# Patient Record
Sex: Female | Born: 1945
Health system: Southern US, Community
[De-identification: ages and names within clinical notes are randomized; demographics above are authoritative.]

## PROBLEM LIST (undated history)

## (undated) DIAGNOSIS — E119 Type 2 diabetes mellitus without complications: Secondary | ICD-10-CM

## (undated) DIAGNOSIS — I10 Essential (primary) hypertension: Secondary | ICD-10-CM

## (undated) DIAGNOSIS — F32A Depression, unspecified: Secondary | ICD-10-CM

## (undated) DIAGNOSIS — I679 Cerebrovascular disease, unspecified: Secondary | ICD-10-CM

## (undated) DIAGNOSIS — F329 Major depressive disorder, single episode, unspecified: Secondary | ICD-10-CM

## (undated) HISTORY — PX: ABDOMINAL HYSTERECTOMY: SHX81

## (undated) HISTORY — DX: Cerebrovascular disease, unspecified: I67.9

## (undated) HISTORY — DX: Depression, unspecified: F32.A

## (undated) HISTORY — PX: HAND SURGERY: SHX662

## (undated) HISTORY — DX: Essential (primary) hypertension: I10

## (undated) HISTORY — DX: Type 2 diabetes mellitus without complications: E11.9

## (undated) HISTORY — DX: Major depressive disorder, single episode, unspecified: F32.9

---

## 1999-02-04 ENCOUNTER — Other Ambulatory Visit: Admission: RE | Admit: 1999-02-04 | Discharge: 1999-02-04 | Payer: Self-pay | Admitting: Obstetrics and Gynecology

## 1999-04-09 ENCOUNTER — Other Ambulatory Visit: Admission: RE | Admit: 1999-04-09 | Discharge: 1999-04-09 | Payer: Self-pay | Admitting: Obstetrics and Gynecology

## 2000-03-02 ENCOUNTER — Other Ambulatory Visit: Admission: RE | Admit: 2000-03-02 | Discharge: 2000-03-02 | Payer: Self-pay | Admitting: Obstetrics and Gynecology

## 2001-06-22 ENCOUNTER — Ambulatory Visit (HOSPITAL_COMMUNITY): Admission: RE | Admit: 2001-06-22 | Discharge: 2001-06-22 | Payer: Self-pay | Admitting: Gastroenterology

## 2002-03-15 ENCOUNTER — Encounter: Payer: Self-pay | Admitting: Obstetrics and Gynecology

## 2002-03-15 ENCOUNTER — Ambulatory Visit (HOSPITAL_COMMUNITY): Admission: RE | Admit: 2002-03-15 | Discharge: 2002-03-15 | Payer: Self-pay | Admitting: Obstetrics and Gynecology

## 2002-09-12 ENCOUNTER — Ambulatory Visit (HOSPITAL_COMMUNITY): Admission: RE | Admit: 2002-09-12 | Discharge: 2002-09-12 | Payer: Self-pay | Admitting: Orthopedic Surgery

## 2002-09-12 ENCOUNTER — Encounter: Payer: Self-pay | Admitting: Orthopedic Surgery

## 2004-06-09 ENCOUNTER — Other Ambulatory Visit: Admission: RE | Admit: 2004-06-09 | Discharge: 2004-06-09 | Payer: Self-pay | Admitting: Obstetrics and Gynecology

## 2005-06-16 ENCOUNTER — Other Ambulatory Visit: Admission: RE | Admit: 2005-06-16 | Discharge: 2005-06-16 | Payer: Self-pay | Admitting: Obstetrics and Gynecology

## 2006-06-22 ENCOUNTER — Other Ambulatory Visit: Admission: RE | Admit: 2006-06-22 | Discharge: 2006-06-22 | Payer: Self-pay | Admitting: Obstetrics and Gynecology

## 2006-12-07 ENCOUNTER — Encounter: Admission: RE | Admit: 2006-12-07 | Discharge: 2006-12-07 | Payer: Self-pay | Admitting: General Surgery

## 2006-12-09 ENCOUNTER — Ambulatory Visit (HOSPITAL_BASED_OUTPATIENT_CLINIC_OR_DEPARTMENT_OTHER): Admission: RE | Admit: 2006-12-09 | Discharge: 2006-12-09 | Payer: Self-pay | Admitting: Orthopedic Surgery

## 2007-07-05 ENCOUNTER — Other Ambulatory Visit: Admission: RE | Admit: 2007-07-05 | Discharge: 2007-07-05 | Payer: Self-pay | Admitting: Obstetrics and Gynecology

## 2008-07-16 ENCOUNTER — Other Ambulatory Visit: Admission: RE | Admit: 2008-07-16 | Discharge: 2008-07-16 | Payer: Self-pay | Admitting: Obstetrics and Gynecology

## 2009-07-22 ENCOUNTER — Other Ambulatory Visit: Admission: RE | Admit: 2009-07-22 | Discharge: 2009-07-22 | Payer: Self-pay | Admitting: Obstetrics and Gynecology

## 2010-12-20 ENCOUNTER — Encounter: Payer: Self-pay | Admitting: General Surgery

## 2011-04-17 NOTE — Op Note (Signed)
NAME:  Cindy Rhodes, Cindy Rhodes                ACCOUNT NO.:  [id number]   MEDICAL RECORD NO.:  [id number]          PATIENT TYPE:  AMB   LOCATION:  DSC                          FACILITY:  [facility]   PHYSICIAN:  Katy Fitch. Sypher, M.D. DATE OF BIRTH:  [birth year]   DATE OF PROCEDURE:  12/09/2006  DATE OF DISCHARGE:                               OPERATIVE REPORT   PREOPERATIVE DIAGNOSIS:  1. Chronic stenosing tenosynovitis left index finger.  2. Chronic stenosing tenosynovitis left long finger.  3. Multiple ganglion formation flexor sheath left long finger.  4. Hemangioma left index finger.   OPERATION:  1. Release of left long finger A1 pulley.  2. Excision of two ganglion cysts from flexor sheath left long finger.  3. Release of A1 pulley left index finger.  4. Excision of hemangioma from left index finger soft tissues palmar      to A1, A2 pulley junction.   SURGEON:  Dr. Molly Maduro Sypher   ASSISTANT:  Annye Rusk PA-C.   ANESTHESIA:  IV regional at proximal brachial levels supplemented by IV  sedation, supervising anesthesiologist Dr. Gypsy Balsam.   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  She has a  history of developing stenosing tenosynovitis of her left index and long  fingers with masses along the palmar surface of the flexor sheath and  long finger consistent with ganglion cyst formation and a hyper mobile  mass in the index finger, long finger web space.  She requested that we  address a stenosing tenosynovitis and remove the masses.   After informed consent, she is brought to the operating room at this  time.   PROCEDURE:  Cindy Rhodes was brought to the operating room and placed in  supine position on the table.  Following anesthesia consult by Dr. Gypsy Balsam  an IV regional block was selected.   In room one an IV regional block was placed at proximal brachial level  under Dr. Burnett Corrente direct  supervision.  Sedation was provided.  The left  arm was then prepped with Betadine soap solution, sterilely draped.  When anesthesia satisfactory we proceeded with transverse incisions, one  in the line of the middle palmar crease and one in the line of the  middle palmar crease.  The flexor sheath of the index and long fingers  were exposed.  The mass between the index, long finger turned out to be  a large cavernous hemangioma.  This was circumferentially dissected,  removed with scissors and its feeding vessels were electrocauterized  with bipolar current.   The A1 pulley of the index finger was then isolated and after protection  of the neurovascular bundles.  The pulley was released along its radial  border with scalpel and scissors.   Attention directed to the long finger.  The A1 pulley was isolated,  found be markedly thickened to wall thickness of at least 3 mm.  This  was released along its radial border.  Distal dissection along the  flexor sheath revealed two ganglion cysts. One  in the ulnar aspect of  the A1-A2 pulley junction and a second one on the proximal aspect of the  A2 pulley radially.  These were removed with a rongeur.   Thereafter full range of motion of the index and long finger was  recovered.  We could not identify any other masses.  There was a small  area of concern near the PIP flexion crease of the index finger which  was thought to be a varicosity.  We left this unexplored.   Cindy Rhodes tolerated surgery and anesthesia well.  Wounds repaired with  mattress suture of 5-0 nylon followed by dressing the wounds with  Xeroflo, sterile gauze and an Ace wrap.  There were no apparent  complications.   Aftercare Cindy Rhodes is provided prescription for Vicodin 5 mg one p.o. 4-  6 hours p.r.n. pain 20 tablets without refill.   She will return to see [place] in the office for follow-up in 1 week at which  time we anticipate suture removal and advancement to exercise  program.      Katy Fitch. Sypher, M.D.  Electronically Signed     RVS/MEDQ  D:  12/09/2006  T:  12/09/2006  Job:  [id number]   cc:   Theressa Millard, M.D.

## 2013-10-16 ENCOUNTER — Ambulatory Visit (INDEPENDENT_AMBULATORY_CARE_PROVIDER_SITE_OTHER): Payer: Medicare Other | Admitting: Podiatry

## 2013-10-16 ENCOUNTER — Encounter: Payer: Self-pay | Admitting: Podiatry

## 2013-10-16 VITALS — BP 145/81 | HR 61 | Resp 18 | Ht 62.0 in | Wt 165.0 lb

## 2013-10-16 DIAGNOSIS — B351 Tinea unguium: Secondary | ICD-10-CM

## 2013-10-16 DIAGNOSIS — M79609 Pain in unspecified limb: Secondary | ICD-10-CM

## 2013-10-18 NOTE — Progress Notes (Signed)
Subjective:     Patient ID: Cindy Rhodes, female   DOB: 1946/11/10, 67 y.o.   MRN: [id number]  HPI patient has nail disease 1-5 both feet with inability for her to cut them and at risk diabetes   Review of Systems     Objective:   Physical Exam Diminished neurovascular status with nail disease and thickness with discomfort 1-5 both feet    Assessment:     Mycotic nail infection at wrist diabetic and pain 1-5 both feet    Plan:     Debridement painful nail bed 1-5 both feet

## 2014-01-08 ENCOUNTER — Ambulatory Visit (INDEPENDENT_AMBULATORY_CARE_PROVIDER_SITE_OTHER): Payer: Medicare Other | Admitting: Podiatry

## 2014-01-08 VITALS — BP 161/81 | HR 74 | Resp 16 | Ht 62.0 in | Wt 165.0 lb

## 2014-01-08 DIAGNOSIS — B351 Tinea unguium: Secondary | ICD-10-CM

## 2014-01-08 DIAGNOSIS — M79609 Pain in unspecified limb: Secondary | ICD-10-CM

## 2014-01-08 DIAGNOSIS — L84 Corns and callosities: Secondary | ICD-10-CM

## 2014-01-09 NOTE — Progress Notes (Signed)
Subjective:     Patient ID: Cindy Rhodes, female   DOB: 14-Jun-1946, 68 y.o.   MRN: [id number]  HPI patient presents with painful nailbeds 1-5 both feet that she can not cut herself. Also complains of lesions on both feet with the left third been the most painful   Review of Systems     Objective:   Physical Exam Neurovascular status intact with nail disease 1-5 both feet that are painful and keratotic lesion third left that is painful    Assessment:     Chronic mycotic nail infection with pain 1-5 both feet and painful lesions left    Plan:     Debridement of nailbeds painful 1-5 both feet and debridement of lesion on the left foot with no bleeding noted

## 2014-01-15 ENCOUNTER — Ambulatory Visit: Payer: Medicare Other | Admitting: Podiatry

## 2014-04-02 ENCOUNTER — Emergency Department (HOSPITAL_COMMUNITY)
Admission: EM | Admit: 2014-04-02 | Discharge: 2014-04-02 | Disposition: A | Discharge status: Home / Self Care | Payer: Medicare Other | Attending: Emergency Medicine | Admitting: Emergency Medicine

## 2014-04-02 ENCOUNTER — Emergency Department (HOSPITAL_COMMUNITY): Payer: Medicare Other

## 2014-04-02 ENCOUNTER — Encounter (HOSPITAL_COMMUNITY): Payer: Self-pay | Admitting: Emergency Medicine

## 2014-04-02 DIAGNOSIS — R739 Hyperglycemia, unspecified: Secondary | ICD-10-CM

## 2014-04-02 DIAGNOSIS — R5383 Other fatigue: Secondary | ICD-10-CM | POA: Insufficient documentation

## 2014-04-02 DIAGNOSIS — Z9119 Patient's noncompliance with other medical treatment and regimen: Secondary | ICD-10-CM | POA: Insufficient documentation

## 2014-04-02 DIAGNOSIS — I10 Essential (primary) hypertension: Secondary | ICD-10-CM | POA: Insufficient documentation

## 2014-04-02 DIAGNOSIS — Z87891 Personal history of nicotine dependence: Secondary | ICD-10-CM | POA: Insufficient documentation

## 2014-04-02 DIAGNOSIS — R209 Unspecified disturbances of skin sensation: Secondary | ICD-10-CM | POA: Insufficient documentation

## 2014-04-02 DIAGNOSIS — E119 Type 2 diabetes mellitus without complications: Secondary | ICD-10-CM | POA: Insufficient documentation

## 2014-04-02 DIAGNOSIS — R5381 Other malaise: Secondary | ICD-10-CM | POA: Insufficient documentation

## 2014-04-02 DIAGNOSIS — F430 Acute stress reaction: Secondary | ICD-10-CM | POA: Insufficient documentation

## 2014-04-02 DIAGNOSIS — Z91199 Patient's noncompliance with other medical treatment and regimen due to unspecified reason: Secondary | ICD-10-CM | POA: Insufficient documentation

## 2014-04-02 DIAGNOSIS — F329 Major depressive disorder, single episode, unspecified: Secondary | ICD-10-CM | POA: Insufficient documentation

## 2014-04-02 DIAGNOSIS — F3289 Other specified depressive episodes: Secondary | ICD-10-CM | POA: Insufficient documentation

## 2014-04-02 DIAGNOSIS — Z79899 Other long term (current) drug therapy: Secondary | ICD-10-CM | POA: Insufficient documentation

## 2014-04-02 DIAGNOSIS — F411 Generalized anxiety disorder: Secondary | ICD-10-CM | POA: Insufficient documentation

## 2014-04-02 LAB — COMPREHENSIVE METABOLIC PANEL
ALBUMIN: 4.1 g/dL (ref 3.5–5.2)
ALT: 69 U/L — ABNORMAL HIGH (ref 0–35)
AST: 56 U/L — AB (ref 0–37)
Alkaline Phosphatase: 65 U/L (ref 39–117)
BUN: 10 mg/dL (ref 6–23)
CALCIUM: 9.6 mg/dL (ref 8.4–10.5)
CO2: 22 mEq/L (ref 19–32)
Chloride: 87 mEq/L — ABNORMAL LOW (ref 96–112)
Creatinine, Ser: 0.4 mg/dL — ABNORMAL LOW (ref 0.50–1.10)
GFR calc Af Amer: >90 mL/min (ref >90)
GFR calc non Af Amer: >90 mL/min (ref >90)
Glucose, Bld: 293 mg/dL — ABNORMAL HIGH (ref 70–99)
Potassium: 3.7 mEq/L (ref 3.7–5.3)
Sodium: 129 mEq/L — ABNORMAL LOW (ref 137–147)
Total Bilirubin: 0.5 mg/dL (ref 0.3–1.2)
Total Protein: 7.5 g/dL (ref 6.0–8.3)

## 2014-04-02 LAB — CBC WITH DIFFERENTIAL/PLATELET
Basophils Absolute: 0 10*3/uL (ref 0.0–0.1)
Basophils Relative: 0 % (ref 0–1)
EOS PCT: 1 % (ref 0–5)
Eosinophils Absolute: 0.1 10*3/uL (ref 0.0–0.7)
HEMATOCRIT: 39.2 % (ref 36.0–46.0)
Hemoglobin: 14 g/dL (ref 12.0–15.0)
LYMPHS ABS: 0.9 10*3/uL (ref 0.7–4.0)
LYMPHS PCT: 9 % — AB (ref 12–46)
MCH: 29.7 pg (ref 26.0–34.0)
MCHC: 35.7 g/dL (ref 30.0–36.0)
MCV: 83.1 fL (ref 78.0–100.0)
MONO ABS: 0.5 10*3/uL (ref 0.1–1.0)
MONOS PCT: 5 % (ref 3–12)
Neutro Abs: 8.2 10*3/uL — ABNORMAL HIGH (ref 1.7–7.7)
Neutrophils Relative %: 85 % — ABNORMAL HIGH (ref 43–77)
PLATELETS: 245 10*3/uL (ref 150–400)
RBC: 4.72 MIL/uL (ref 3.87–5.11)
RDW: 13.8 % (ref 11.5–15.5)
WBC: 9.7 10*3/uL (ref 4.0–10.5)

## 2014-04-02 LAB — URINALYSIS, ROUTINE W REFLEX MICROSCOPIC
Bilirubin Urine: NEGATIVE
Glucose, UA: 500 mg/dL — AB
HGB URINE DIPSTICK: NEGATIVE
KETONES UR: NEGATIVE mg/dL
LEUKOCYTES UA: NEGATIVE
Nitrite: NEGATIVE
PH: 7.5 (ref 5.0–8.0)
Protein, ur: NEGATIVE mg/dL
Specific Gravity, Urine: 1.013 (ref 1.005–1.030)
Urobilinogen, UA: 0.2 mg/dL (ref 0.0–1.0)

## 2014-04-02 LAB — I-STAT TROPONIN, ED: TROPONIN I, POC: 0.02 ng/mL (ref 0.00–0.08)

## 2014-04-02 NOTE — ED Notes (Signed)
Patient has returned from xray 

## 2014-04-02 NOTE — ED Notes (Addendum)
Pt reports that LSN is several days ago. Pt reports for past few days feeling unwell, feeling like "jump out of skin", hypertension that she takes meds for, tingling in left arm. SOB, belching. Reports very shaky and generalized weakness in legs as well. Pt reports that she has had some mild diarr.

## 2014-04-02 NOTE — ED Notes (Signed)
Dr Allen at bedside  

## 2014-04-02 NOTE — Discharge Instructions (Signed)

## 2014-04-02 NOTE — ED Provider Notes (Signed)
CSN: [id number]     Arrival date & time 04/02/14  1151 History   First MD Initiated Contact with Patient 04/02/14 1457     Chief Complaint  Patient presents with  . Dizziness  . Weakness  . Tingling     (Consider location/radiation/quality/duration/timing/severity/associated sxs/prior Treatment) Patient is a 68 y.o. female presenting with dizziness and weakness. The history is provided by the patient and the spouse.  Dizziness Weakness   patient here complaining of weakness and feeling very anxious. Increased stress at home associated with the upcoming business trip by her husband. Patient denies anginal type chest pain. No severe shortness of breath. No abdominal pain. History of anxiety before in the past. Symptoms are persistent. She does take Zoloft daily for depression. No fever or chills. No vomiting or diarrhea. Nothing makes her symptoms better. Has a history of diabetes and has not been compliant with her diabetic diet. She does not take her blood sugars daily on a regular basis.  Past Medical History  Diagnosis Date  . Diabetes mellitus without complication   . Depression   . Hypertension    Past Surgical History  Procedure Laterality Date  . Abdominal hysterectomy    . Hand surgery Left    History reviewed. No pertinent family history. History  Substance Use Topics  . Smoking status: Former Research scientist (life sciences)  . Smokeless tobacco: Never Used     Comment: quit years ago   . Alcohol Use: No   OB History   Grav Para Term Preterm Abortions TAB SAB Ect Mult Living                 Review of Systems  Neurological: Positive for dizziness and weakness.  All other systems reviewed and are negative.     Allergies  Meloxicam and Sulfa antibiotics  Home Medications   Prior to Admission medications   Medication Sig Start Date End Date Taking? Authorizing Provider  CRESTOR 5 MG tablet  10/06/13   Historical Provider, MD  metFORMIN (GLUCOPHAGE) 500 MG tablet Take 500 mg by mouth  2 (two) times daily with a meal.    Historical Provider, MD  metoprolol tartrate (LOPRESSOR) 25 MG tablet  09/30/13   Historical Provider, MD  sertraline (ZOLOFT) 50 MG tablet  07/22/13   Historical Provider, MD  valsartan-hydrochlorothiazide (DIOVAN-HCT) 320-25 MG per tablet  08/12/13   Historical Provider, MD   BP 178/94  Pulse 98  Temp(Src) 98.7 F (37.1 C) (Oral)  Resp 18  Ht 5\' 2"  (1.575 m)  Wt 167 lb (75.751 kg)  BMI 30.54 kg/m2  SpO2 100% Physical Exam  Nursing note and vitals reviewed. Constitutional: She is oriented to person, place, and time. She appears well-developed and well-nourished.  Non-toxic appearance. No distress.  HENT:  Head: Normocephalic and atraumatic.  Eyes: Conjunctivae, EOM and lids are normal. Pupils are equal, round, and reactive to light.  Neck: Normal range of motion. Neck supple. No tracheal deviation present. No mass present.  Cardiovascular: Normal rate, regular rhythm and normal heart sounds.  Exam reveals no gallop.   No murmur heard. Pulmonary/Chest: Effort normal and breath sounds normal. No stridor. No respiratory distress. She has no decreased breath sounds. She has no wheezes. She has no rhonchi. She has no rales.  Abdominal: Soft. Normal appearance and bowel sounds are normal. She exhibits no distension. There is no tenderness. There is no rebound and no CVA tenderness.  Musculoskeletal: Normal range of motion. She exhibits no edema and no tenderness.  Neurological: She is alert and oriented to person, place, and time. She has normal strength. No cranial nerve deficit or sensory deficit. GCS eye subscore is 4. GCS verbal subscore is 5. GCS motor subscore is 6.  Skin: Skin is warm and dry. No abrasion and no rash noted.  Psychiatric: Her speech is normal and behavior is normal. Her mood appears anxious.    ED Course  Procedures (including critical care time) Labs Review Labs Reviewed  COMPREHENSIVE METABOLIC PANEL - Abnormal; Notable for the  following:    Sodium 129 (*)    Chloride 87 (*)    Glucose, Bld 293 (*)    Creatinine, Ser 0.40 (*)    AST 56 (*)    ALT 69 (*)    All other components within normal limits  CBC WITH DIFFERENTIAL - Abnormal; Notable for the following:    Neutrophils Relative % 85 (*)    Neutro Abs 8.2 (*)    Lymphocytes Relative 9 (*)    All other components within normal limits  URINALYSIS, ROUTINE W REFLEX MICROSCOPIC - Abnormal; Notable for the following:    Glucose, UA 500 (*)    All other components within normal limits  I-STAT TROPOININ, ED    Imaging Review Dg Chest 2 View  04/02/2014   CLINICAL DATA:  Dizziness and weakness.  Tingling  EXAM: CHEST  2 VIEW  COMPARISON:  None.  FINDINGS: The heart size and mediastinal contours are within normal limits. Both lungs are clear. The visualized skeletal structures are unremarkable.  IMPRESSION: No active cardiopulmonary disease.   Electronically Signed   By: Kerby Moors M.D.   On: 04/02/2014 13:51     EKG Interpretation   Date/Time:  Monday Apr 02 2014 12:16:18 EDT Ventricular Rate:  96 PR Interval:  130 QRS Duration: 80 QT Interval:  372 QTC Calculation: 469 R Axis:   22 Text Interpretation:  Normal sinus rhythm Nonspecific ST and T wave  abnormality Abnormal ECG No significant change since last tracing  Confirmed by Zenia Resides  MD, Warren Lindahl ([id number]) on 04/02/2014 2:57:33 PM      MDM   Final diagnoses:  None   Spoke with patient's primary care Dr., Dr. Maxwell Caul, he recommends no therapy for the patient's mild hypertension or her mild hyperglycemia. He will see her in the office    Leota Jacobsen, MD 04/02/14 [phone]

## 2014-04-02 NOTE — ED Notes (Signed)
Pt comfortable with discharge and follow up instructions. No prescriptions. 

## 2014-04-09 ENCOUNTER — Ambulatory Visit (INDEPENDENT_AMBULATORY_CARE_PROVIDER_SITE_OTHER): Payer: Medicare Other | Admitting: Podiatry

## 2014-04-09 ENCOUNTER — Encounter: Payer: Self-pay | Admitting: Podiatry

## 2014-04-09 VITALS — BP 132/70 | HR 72 | Resp 12

## 2014-04-09 DIAGNOSIS — B351 Tinea unguium: Secondary | ICD-10-CM

## 2014-04-09 DIAGNOSIS — M79609 Pain in unspecified limb: Secondary | ICD-10-CM

## 2014-04-09 NOTE — Progress Notes (Signed)
Subjective:     Patient ID: Cindy Rhodes, female   DOB: Jul 03, 1946, 68 y.o.   MRN: [id number]  HPI patient presents with thick nailbeds 1-5 both feet that are painful and difficult to cut   Review of Systems     Objective:   Physical Exam Neurovascular status intact with thick nailbeds 1-5 both feet that are brittle and yellow in appearance    Assessment:     Mycotic nail infection with pain 1-5 both feet    Plan:     Debridement painful nailbeds 1-5 both feet with no bleeding noted

## 2014-07-30 ENCOUNTER — Ambulatory Visit (INDEPENDENT_AMBULATORY_CARE_PROVIDER_SITE_OTHER): Payer: Medicare Other | Admitting: Podiatry

## 2014-07-30 DIAGNOSIS — L84 Corns and callosities: Secondary | ICD-10-CM

## 2014-07-30 DIAGNOSIS — M79609 Pain in unspecified limb: Secondary | ICD-10-CM

## 2014-07-30 DIAGNOSIS — M79673 Pain in unspecified foot: Secondary | ICD-10-CM

## 2014-07-30 DIAGNOSIS — B351 Tinea unguium: Secondary | ICD-10-CM

## 2014-07-31 NOTE — Progress Notes (Signed)
Subjective:     Patient ID: Cindy Rhodes, female   DOB: [birth year], 68 y.o.   MRN: [id number]  HPI patient presents with the long gaited nailbeds that are painful 1-5 of both feet   Review of Systems     Objective:   Physical Exam Neurovascular status intact with thick yellow brittle nailbeds that are painful 1-5 both feet    Assessment:     Mycotic nail infection is with pain 1-5 both feet    Plan:     Debride painful nailbeds 1-5 both feet with no bleeding noted

## 2014-08-13 ENCOUNTER — Other Ambulatory Visit: Payer: Medicare Other

## 2014-10-08 ENCOUNTER — Ambulatory Visit (INDEPENDENT_AMBULATORY_CARE_PROVIDER_SITE_OTHER): Payer: Medicare Other | Admitting: Podiatry

## 2014-10-08 DIAGNOSIS — M79673 Pain in unspecified foot: Secondary | ICD-10-CM

## 2014-10-08 DIAGNOSIS — B351 Tinea unguium: Secondary | ICD-10-CM

## 2014-10-10 NOTE — Progress Notes (Signed)
Subjective:     Patient ID: Cindy Rhodes, female   DOB: 04/18/1946, 68 y.o.   MRN: [id number]  HPIpatient presents with nail disease 1-5 both feet that are thick yellow brittle and painful and she cannot cut   Review of Systems     Objective:   Physical Exam Neurovascular status was found to be unchanged with thick yellow brittle nailbeds 1-5 both feet that are painful when palpated    Assessment:     Mycotic nail infection of both feet nailbeds 1-5    Plan:     Debris painful nailbeds 1-5 both feet with no iatrogenic bleeding noted      

## 2014-12-10 ENCOUNTER — Ambulatory Visit (INDEPENDENT_AMBULATORY_CARE_PROVIDER_SITE_OTHER): Payer: Medicare Other | Admitting: Podiatry

## 2014-12-10 DIAGNOSIS — M79673 Pain in unspecified foot: Secondary | ICD-10-CM

## 2014-12-10 DIAGNOSIS — B351 Tinea unguium: Secondary | ICD-10-CM

## 2014-12-10 NOTE — Progress Notes (Signed)
Subjective:     Patient ID: Cindy Rhodes, female   DOB: May 08, 1946, 69 y.o.   MRN: [id number]  HPIpatient presents with nail disease 1-5 both feet that are thick yellow brittle and painful and she cannot cut   Review of Systems     Objective:   Physical Exam Neurovascular status was found to be unchanged with thick yellow brittle nailbeds 1-5 both feet that are painful when palpated    Assessment:     Mycotic nail infection of both feet nailbeds 1-5    Plan:     Debris painful nailbeds 1-5 both feet with no iatrogenic bleeding noted

## 2015-02-18 ENCOUNTER — Ambulatory Visit (INDEPENDENT_AMBULATORY_CARE_PROVIDER_SITE_OTHER): Payer: Medicare Other | Admitting: Podiatry

## 2015-02-18 DIAGNOSIS — B351 Tinea unguium: Secondary | ICD-10-CM

## 2015-02-18 DIAGNOSIS — M79673 Pain in unspecified foot: Secondary | ICD-10-CM | POA: Diagnosis not present

## 2015-02-20 NOTE — Progress Notes (Signed)
Subjective:     Patient ID: Cindy Rhodes, female   DOB: [birth year], 69 y.o.   MRN: [id number]  HPI patient presents with elongated nailbeds 1-5 both feet that are painful and she cannot cut. States that she has trouble wearing shoe gear comfortably   Review of Systems     Objective:   Physical Exam Neurovascular status intact with thick yellow brittle nailbeds 1-5 both feet that are moderately tender when pressed    Assessment:     Mycotic nail infection with pain 1-5 both feet    Plan:     Debris painful nailbeds 1-5 both feet with no iatrogenic bleeding noted

## 2015-05-03 ENCOUNTER — Ambulatory Visit: Payer: Medicare Other | Admitting: Podiatry

## 2015-05-07 ENCOUNTER — Ambulatory Visit (INDEPENDENT_AMBULATORY_CARE_PROVIDER_SITE_OTHER): Payer: Medicare Other | Admitting: Podiatry

## 2015-05-07 DIAGNOSIS — B351 Tinea unguium: Secondary | ICD-10-CM

## 2015-05-07 DIAGNOSIS — M79673 Pain in unspecified foot: Secondary | ICD-10-CM

## 2015-05-07 NOTE — Progress Notes (Signed)
Patient ID: Cindy Rhodes, female   DOB: 23-Aug-1946, 69 y.o.   MRN: [id number] Complaint:  Visit Type: Patient returns to my office for continued preventative foot care services. Complaint: Patient states" my nails have grown long and thick and become painful to walk and wear shoes" Patient has been diagnosed with DM with no complications. He presents for preventative foot care services. No changes to ROS  Podiatric Exam: Vascular: dorsalis pedis and posterior tibial pulses are palpable bilateral. Capillary return is immediate. Temperature gradient is WNL. Skin turgor WNL  Sensorium: Normal Semmes Weinstein monofilament test. Normal tactile sensation bilaterally. Nail Exam: Pt has thick disfigured discolored nails with subungual debris noted bilateral entire nail hallux through fifth toenails Ulcer Exam: There is no evidence of ulcer or pre-ulcerative changes or infection. Orthopedic Exam: Muscle tone and strength are WNL. No limitations in general ROM. No crepitus or effusions noted. Foot type and digits show no abnormalities. Bony prominences are unremarkable. Skin: No Porokeratosis. No infection or ulcers  Diagnosis:  Tinea unguium, Pain in right toe, pain in left toes  Treatment & Plan Procedures and Treatment: Consent by patient was obtained for treatment procedures. The patient understood the discussion of treatment and procedures well. All questions were answered thoroughly reviewed. Debridement of mycotic and hypertrophic toenails, 1 through 5 bilateral and clearing of subungual debris. No ulceration, no infection noted.  Return Visit-Office Procedure: Patient instructed to return to the office for a follow up visit 3 months for continued evaluation and treatment.

## 2015-05-09 ENCOUNTER — Ambulatory Visit: Payer: Medicare Other | Admitting: Podiatry

## 2015-07-23 ENCOUNTER — Ambulatory Visit (INDEPENDENT_AMBULATORY_CARE_PROVIDER_SITE_OTHER): Payer: Medicare Other | Admitting: Podiatry

## 2015-07-23 ENCOUNTER — Encounter: Payer: Self-pay | Admitting: Podiatry

## 2015-07-23 DIAGNOSIS — B351 Tinea unguium: Secondary | ICD-10-CM | POA: Diagnosis not present

## 2015-07-23 DIAGNOSIS — M79673 Pain in unspecified foot: Secondary | ICD-10-CM

## 2015-07-23 NOTE — Progress Notes (Signed)
Patient ID: Cindy Rhodes, female   DOB: 07-Dec-1945, 69 y.o.   MRN: [id number] Complaint:  Visit Type: Patient returns to my office for continued preventative foot care services. Complaint: Patient states" my nails have grown long and thick and become painful to walk and wear shoes" Patient has been diagnosed with DM with no complications. He presents for preventative foot care services. No changes to ROS  Podiatric Exam: Vascular: dorsalis pedis and posterior tibial pulses are palpable bilateral. Capillary return is immediate. Temperature gradient is WNL. Skin turgor WNL  Sensorium: Normal Semmes Weinstein monofilament test. Normal tactile sensation bilaterally. Nail Exam: Pt has thick disfigured discolored nails with subungual debris noted bilateral entire nail hallux through fifth toenails Ulcer Exam: There is no evidence of ulcer or pre-ulcerative changes or infection. Orthopedic Exam: Muscle tone and strength are WNL. No limitations in general ROM. No crepitus or effusions noted. Foot type and digits show no abnormalities. Bony prominences are unremarkable. Skin: No Porokeratosis. No infection or ulcers  Diagnosis:  Tinea unguium, Pain in right toe, pain in left toes  Treatment & Plan Procedures and Treatment: Consent by patient was obtained for treatment procedures. The patient understood the discussion of treatment and procedures well. All questions were answered thoroughly reviewed. Debridement of mycotic and hypertrophic toenails, 1 through 5 bilateral and clearing of subungual debris. No ulceration, no infection noted.  Return Visit-Office Procedure: Patient instructed to return to the office for a follow up visit 3 months for continued evaluation and treatment.

## 2015-10-04 ENCOUNTER — Ambulatory Visit: Payer: Medicare Other | Admitting: Podiatry

## 2015-10-09 ENCOUNTER — Ambulatory Visit: Payer: Medicare Other | Admitting: Podiatry

## 2015-10-11 ENCOUNTER — Ambulatory Visit (INDEPENDENT_AMBULATORY_CARE_PROVIDER_SITE_OTHER): Payer: Medicare Other | Admitting: Podiatry

## 2015-10-11 ENCOUNTER — Encounter: Payer: Self-pay | Admitting: Podiatry

## 2015-10-11 DIAGNOSIS — B351 Tinea unguium: Secondary | ICD-10-CM | POA: Diagnosis not present

## 2015-10-11 DIAGNOSIS — M79673 Pain in unspecified foot: Secondary | ICD-10-CM

## 2015-10-11 NOTE — Progress Notes (Signed)
Patient ID: Cindy Rhodes, female   DOB: 1946/11/29, 69 y.o.   MRN: [id number] Complaint:  Visit Type: Patient returns to my office for continued preventative foot care services. Complaint: Patient states" my nails have grown long and thick and become painful to walk and wear shoes" Patient has been diagnosed with DM with no complications. He presents for preventative foot care services. No changes to ROS  Podiatric Exam: Vascular: dorsalis pedis and posterior tibial pulses are palpable bilateral. Capillary return is immediate. Temperature gradient is WNL. Skin turgor WNL  Sensorium: Normal Semmes Weinstein monofilament test. Normal tactile sensation bilaterally. Nail Exam: Pt has thick disfigured discolored nails with subungual debris noted bilateral entire nail hallux through fifth toenails Ulcer Exam: There is no evidence of ulcer or pre-ulcerative changes or infection. Orthopedic Exam: Muscle tone and strength are WNL. No limitations in general ROM. No crepitus or effusions noted. Foot type and digits show no abnormalities. Bony prominences are unremarkable. Skin: No Porokeratosis. No infection or ulcers  Diagnosis:  Tinea unguium, Pain in right toe, pain in left toes  Treatment & Plan Procedures and Treatment: Consent by patient was obtained for treatment procedures. The patient understood the discussion of treatment and procedures well. All questions were answered thoroughly reviewed. Debridement of mycotic and hypertrophic toenails, 1 through 5 bilateral and clearing of subungual debris. No ulceration, no infection noted.  Return Visit-Office Procedure: Patient instructed to return to the office for a follow up visit 3 months for continued evaluation and treatment.  Gardiner Barefoot DPM

## 2015-12-24 ENCOUNTER — Encounter: Payer: Self-pay | Admitting: Podiatry

## 2015-12-24 ENCOUNTER — Ambulatory Visit (INDEPENDENT_AMBULATORY_CARE_PROVIDER_SITE_OTHER): Payer: Medicare Other | Admitting: Podiatry

## 2015-12-24 DIAGNOSIS — B351 Tinea unguium: Secondary | ICD-10-CM | POA: Diagnosis not present

## 2015-12-24 DIAGNOSIS — M79673 Pain in unspecified foot: Secondary | ICD-10-CM | POA: Diagnosis not present

## 2015-12-24 NOTE — Progress Notes (Signed)
Patient ID: Cindy Rhodes, female   DOB: 07-29-1946, 70 y.o.   MRN: [id number] Complaint:  Visit Type: Patient returns to my office for continued preventative foot care services. Complaint: Patient states" my nails have grown long and thick and become painful to walk and wear shoes" Patient has been diagnosed with DM with no complications. He presents for preventative foot care services. No changes to ROS  Podiatric Exam: Vascular: dorsalis pedis and posterior tibial pulses are palpable bilateral. Capillary return is immediate. Temperature gradient is WNL. Skin turgor WNL  Sensorium: Normal Semmes Weinstein monofilament test. Normal tactile sensation bilaterally. Nail Exam: Pt has thick disfigured discolored nails with subungual debris noted bilateral entire nail hallux through fifth toenails Ulcer Exam: There is no evidence of ulcer or pre-ulcerative changes or infection. Orthopedic Exam: Muscle tone and strength are WNL. No limitations in general ROM. No crepitus or effusions noted. Foot type and digits show no abnormalities. Bony prominences are unremarkable. Skin: No Porokeratosis. No infection or ulcers  Diagnosis:  Tinea unguium, Pain in right toe, pain in left toes  Treatment & Plan Procedures and Treatment: Consent by patient was obtained for treatment procedures. The patient understood the discussion of treatment and procedures well. All questions were answered thoroughly reviewed. Debridement of mycotic and hypertrophic toenails, 1 through 5 bilateral and clearing of subungual debris. No ulceration, no infection noted.  Return Visit-Office Procedure: Patient instructed to return to the office for a follow up visit 3 months for continued evaluation and treatment.  Gardiner Barefoot DPM

## 2016-03-03 ENCOUNTER — Ambulatory Visit (INDEPENDENT_AMBULATORY_CARE_PROVIDER_SITE_OTHER): Payer: Medicare Other | Admitting: Podiatry

## 2016-03-03 ENCOUNTER — Encounter: Payer: Self-pay | Admitting: Podiatry

## 2016-03-03 DIAGNOSIS — B351 Tinea unguium: Secondary | ICD-10-CM

## 2016-03-03 DIAGNOSIS — M79673 Pain in unspecified foot: Secondary | ICD-10-CM | POA: Diagnosis not present

## 2016-03-03 NOTE — Progress Notes (Signed)
Patient ID: Cindy Rhodes, female   DOB: Nov 22, 1946, 70 y.o.   MRN: [id number] Complaint:  Visit Type: Patient returns to my office for continued preventative foot care services. Complaint: Patient states" my nails have grown long and thick and become painful to walk and wear shoes" Patient has been diagnosed with DM with no complications. He presents for preventative foot care services. No changes to ROS  Podiatric Exam: Vascular: dorsalis pedis and posterior tibial pulses are palpable bilateral. Capillary return is immediate. Temperature gradient is WNL. Skin turgor WNL  Sensorium: Normal Semmes Weinstein monofilament test. Normal tactile sensation bilaterally. Nail Exam: Pt has thick disfigured discolored nails with subungual debris noted bilateral entire nail hallux through fifth toenails Ulcer Exam: There is no evidence of ulcer or pre-ulcerative changes or infection. Orthopedic Exam: Muscle tone and strength are WNL. No limitations in general ROM. No crepitus or effusions noted. Foot type and digits show no abnormalities. Bony prominences are unremarkable. Skin: No Porokeratosis. No infection or ulcers  Diagnosis:  Tinea unguium, Pain in right toe, pain in left toes  Treatment & Plan Procedures and Treatment: Consent by patient was obtained for treatment procedures. The patient understood the discussion of treatment and procedures well. All questions were answered thoroughly reviewed. Debridement of mycotic and hypertrophic toenails, 1 through 5 bilateral and clearing of subungual debris. No ulceration, no infection noted.  Return Visit-Office Procedure: Patient instructed to return to the office for a follow up visit 3 months for continued evaluation and treatment.  Gardiner Barefoot DPM

## 2016-06-09 ENCOUNTER — Encounter: Payer: Self-pay | Admitting: Podiatry

## 2016-06-09 ENCOUNTER — Ambulatory Visit (INDEPENDENT_AMBULATORY_CARE_PROVIDER_SITE_OTHER): Payer: Medicare Other | Admitting: Podiatry

## 2016-06-09 DIAGNOSIS — M79673 Pain in unspecified foot: Secondary | ICD-10-CM | POA: Diagnosis not present

## 2016-06-09 DIAGNOSIS — B351 Tinea unguium: Secondary | ICD-10-CM

## 2016-06-09 NOTE — Progress Notes (Signed)
Patient ID: Cindy Rhodes, female   DOB: June 16, 1946, 70 y.o.   MRN: [id number] Complaint:  Visit Type: Patient returns to my office for continued preventative foot care services. Complaint: Patient states" my nails have grown long and thick and become painful to walk and wear shoes" Patient has been diagnosed with DM with no complications. He presents for preventative foot care services. No changes to ROS  Podiatric Exam: Vascular: dorsalis pedis and posterior tibial pulses are palpable bilateral. Capillary return is immediate. Temperature gradient is WNL. Skin turgor WNL  Sensorium: Normal Semmes Weinstein monofilament test. Normal tactile sensation bilaterally. Nail Exam: Pt has thick disfigured discolored nails with subungual debris noted bilateral entire nail hallux through fifth toenails Ulcer Exam: There is no evidence of ulcer or pre-ulcerative changes or infection. Orthopedic Exam: Muscle tone and strength are WNL. No limitations in general ROM. No crepitus or effusions noted. Foot type and digits show no abnormalities. Bony prominences are unremarkable. Skin: No Porokeratosis. No infection or ulcers  Diagnosis:  Tinea unguium, Pain in right toe, pain in left toes  Treatment & Plan Procedures and Treatment: Consent by patient was obtained for treatment procedures. The patient understood the discussion of treatment and procedures well. All questions were answered thoroughly reviewed. Debridement of mycotic and hypertrophic toenails, 1 through 5 bilateral and clearing of subungual debris. No ulceration, no infection noted.  Return Visit-Office Procedure: Patient instructed to return to the office for a follow up visit 3 months for continued evaluation and treatment.  Gardiner Barefoot DPM

## 2016-08-14 ENCOUNTER — Ambulatory Visit (INDEPENDENT_AMBULATORY_CARE_PROVIDER_SITE_OTHER): Payer: Medicare Other | Admitting: Podiatry

## 2016-08-14 ENCOUNTER — Encounter: Payer: Self-pay | Admitting: Podiatry

## 2016-08-14 VITALS — Ht 62.0 in | Wt 165.0 lb

## 2016-08-14 DIAGNOSIS — M79673 Pain in unspecified foot: Secondary | ICD-10-CM | POA: Diagnosis not present

## 2016-08-14 DIAGNOSIS — B351 Tinea unguium: Secondary | ICD-10-CM | POA: Diagnosis not present

## 2016-08-14 NOTE — Progress Notes (Signed)
Patient ID: Cindy Rhodes, female   DOB: [birth year], 70 y.o.   MRN: [id number] Complaint:  Visit Type: Patient returns to my office for continued preventative foot care services. Complaint: Patient states" my nails have grown long and thick and become painful to walk and wear shoes" Patient has been diagnosed with DM with no complications. He presents for preventative foot care services. No changes to ROS  Podiatric Exam: Vascular: dorsalis pedis and posterior tibial pulses are palpable bilateral. Capillary return is immediate. Temperature gradient is WNL. Skin turgor WNL  Sensorium: Normal Semmes Weinstein monofilament test. Normal tactile sensation bilaterally. Nail Exam: Pt has thick disfigured discolored nails with subungual debris noted bilateral entire nail hallux through fifth toenails Ulcer Exam: There is no evidence of ulcer or pre-ulcerative changes or infection. Orthopedic Exam: Muscle tone and strength are WNL. No limitations in general ROM. No crepitus or effusions noted. Foot type and digits show no abnormalities. Bony prominences are unremarkable. Skin: No Porokeratosis. No infection or ulcers  Diagnosis:  Tinea unguium, Pain in right toe, pain in left toes  Treatment & Plan Procedures and Treatment: Consent by patient was obtained for treatment procedures. The patient understood the discussion of treatment and procedures well. All questions were answered thoroughly reviewed. Debridement of mycotic and hypertrophic toenails, 1 through 5 bilateral and clearing of subungual debris. No ulceration, no infection noted.  Return Visit-Office Procedure: Patient instructed to return to the office for a follow up visit 10 weeks  for continued evaluation and treatment.  Gardiner Barefoot DPM

## 2016-08-18 ENCOUNTER — Ambulatory Visit: Payer: Medicare Other | Admitting: Podiatry

## 2016-10-30 ENCOUNTER — Ambulatory Visit (INDEPENDENT_AMBULATORY_CARE_PROVIDER_SITE_OTHER): Payer: Medicare Other | Admitting: Podiatry

## 2016-10-30 ENCOUNTER — Encounter: Payer: Self-pay | Admitting: Podiatry

## 2016-10-30 VITALS — Ht 62.0 in | Wt 165.0 lb

## 2016-10-30 DIAGNOSIS — B351 Tinea unguium: Secondary | ICD-10-CM

## 2016-10-30 DIAGNOSIS — M79676 Pain in unspecified toe(s): Secondary | ICD-10-CM | POA: Diagnosis not present

## 2016-10-30 NOTE — Progress Notes (Signed)
Patient ID: Cindy Rhodes, female   DOB: 01/02/1946, 70 y.o.   MRN: [id number] Complaint:  Visit Type: Patient returns to my office for continued preventative foot care services. Complaint: Patient states" my nails have grown long and thick and become painful to walk and wear shoes" Patient has been diagnosed with DM with no complications. He presents for preventative foot care services. No changes to ROS  Podiatric Exam: Vascular: dorsalis pedis and posterior tibial pulses are palpable bilateral. Capillary return is immediate. Temperature gradient is WNL. Skin turgor WNL  Sensorium: Normal Semmes Weinstein monofilament test. Normal tactile sensation bilaterally. Nail Exam: Pt has thick disfigured discolored nails with subungual debris noted bilateral entire nail hallux through fifth toenails Ulcer Exam: There is no evidence of ulcer or pre-ulcerative changes or infection. Orthopedic Exam: Muscle tone and strength are WNL. No limitations in general ROM. No crepitus or effusions noted. Foot type and digits show no abnormalities. Bony prominences are unremarkable. Skin: No Porokeratosis. No infection or ulcers  Diagnosis:  Tinea unguium, Pain in right toe, pain in left toes  Treatment & Plan Procedures and Treatment: Consent by patient was obtained for treatment procedures. The patient understood the discussion of treatment and procedures well. All questions were answered thoroughly reviewed. Debridement of mycotic and hypertrophic toenails, 1 through 5 bilateral and clearing of subungual debris. No ulceration, no infection noted.  Return Visit-Office Procedure: Patient instructed to return to the office for a follow up visit 10 weeks  for continued evaluation and treatment.  Gardiner Barefoot DPM

## 2016-12-20 DIAGNOSIS — B349 Viral infection, unspecified: Secondary | ICD-10-CM | POA: Diagnosis not present

## 2016-12-20 DIAGNOSIS — R05 Cough: Secondary | ICD-10-CM | POA: Diagnosis not present

## 2017-01-12 ENCOUNTER — Encounter: Payer: Self-pay | Admitting: Podiatry

## 2017-01-12 ENCOUNTER — Ambulatory Visit (INDEPENDENT_AMBULATORY_CARE_PROVIDER_SITE_OTHER): Payer: PPO | Admitting: Podiatry

## 2017-01-12 VITALS — Ht 62.0 in | Wt 165.0 lb

## 2017-01-12 DIAGNOSIS — B351 Tinea unguium: Secondary | ICD-10-CM

## 2017-01-12 DIAGNOSIS — E119 Type 2 diabetes mellitus without complications: Secondary | ICD-10-CM

## 2017-01-12 DIAGNOSIS — M79676 Pain in unspecified toe(s): Secondary | ICD-10-CM

## 2017-01-12 NOTE — Progress Notes (Signed)
Patient ID: EPSIE WEMPE, female   DOB: [birth year], 71 y.o.   MRN: [id number] Complaint:  Visit Type: Patient returns to my office for continued preventative foot care services. Complaint: Patient states" my nails have grown long and thick and become painful to walk and wear shoes" Patient has been diagnosed with DM with no complications. He presents for preventative foot care services. No changes to ROS  Podiatric Exam: Vascular: dorsalis pedis and posterior tibial pulses are palpable bilateral. Capillary return is immediate. Temperature gradient is WNL. Skin turgor WNL  Sensorium: Normal Semmes Weinstein monofilament test. Normal tactile sensation bilaterally. Nail Exam: Pt has thick disfigured discolored nails with subungual debris noted bilateral entire nail hallux through fifth toenails Ulcer Exam: There is no evidence of ulcer or pre-ulcerative changes or infection. Orthopedic Exam: Muscle tone and strength are WNL. No limitations in general ROM. No crepitus or effusions noted. Foot type and digits show no abnormalities. Bony prominences are unremarkable. Skin: No Porokeratosis. No infection or ulcers  Diagnosis:  Tinea unguium, Pain in right toe, pain in left toes  Treatment & Plan Procedures and Treatment: Consent by patient was obtained for treatment procedures. The patient understood the discussion of treatment and procedures well. All questions were answered thoroughly reviewed. Debridement of mycotic and hypertrophic toenails, 1 through 5 bilateral and clearing of subungual debris. No ulceration, no infection noted.  Return Visit-Office Procedure: Patient instructed to return to the office for a follow up visit 10 weeks  for continued evaluation and treatment.  Gardiner Barefoot DPM

## 2017-02-04 DIAGNOSIS — N763 Subacute and chronic vulvitis: Secondary | ICD-10-CM | POA: Diagnosis not present

## 2017-02-04 DIAGNOSIS — N907 Vulvar cyst: Secondary | ICD-10-CM | POA: Diagnosis not present

## 2017-02-04 DIAGNOSIS — R3915 Urgency of urination: Secondary | ICD-10-CM | POA: Diagnosis not present

## 2017-02-25 DIAGNOSIS — J069 Acute upper respiratory infection, unspecified: Secondary | ICD-10-CM | POA: Diagnosis not present

## 2017-02-25 DIAGNOSIS — R197 Diarrhea, unspecified: Secondary | ICD-10-CM | POA: Diagnosis not present

## 2017-02-25 DIAGNOSIS — Z7984 Long term (current) use of oral hypoglycemic drugs: Secondary | ICD-10-CM | POA: Diagnosis not present

## 2017-02-25 DIAGNOSIS — E119 Type 2 diabetes mellitus without complications: Secondary | ICD-10-CM | POA: Diagnosis not present

## 2017-03-16 ENCOUNTER — Ambulatory Visit (INDEPENDENT_AMBULATORY_CARE_PROVIDER_SITE_OTHER): Payer: PPO | Admitting: Podiatry

## 2017-03-16 DIAGNOSIS — B351 Tinea unguium: Secondary | ICD-10-CM

## 2017-03-16 DIAGNOSIS — E119 Type 2 diabetes mellitus without complications: Secondary | ICD-10-CM

## 2017-03-16 DIAGNOSIS — M79676 Pain in unspecified toe(s): Secondary | ICD-10-CM | POA: Diagnosis not present

## 2017-03-16 NOTE — Progress Notes (Signed)
Patient ID: Cindy Rhodes, female   DOB: 04/20/1946, 71 y.o.   MRN: [id number] Complaint:  Visit Type: Patient returns to my office for continued preventative foot care services. Complaint: Patient states" my nails have grown long and thick and become painful to walk and wear shoes" Patient has been diagnosed with DM with no complications. He presents for preventative foot care services. No changes to ROS  Podiatric Exam: Vascular: dorsalis pedis and posterior tibial pulses are palpable bilateral. Capillary return is immediate. Temperature gradient is WNL. Skin turgor WNL  Sensorium: Normal Semmes Weinstein monofilament test. Normal tactile sensation bilaterally. Nail Exam: Pt has thick disfigured discolored nails with subungual debris noted bilateral entire nail hallux through fifth toenails Ulcer Exam: There is no evidence of ulcer or pre-ulcerative changes or infection. Orthopedic Exam: Muscle tone and strength are WNL. No limitations in general ROM. No crepitus or effusions noted. Foot type and digits show no abnormalities. Bony prominences are unremarkable. Skin: No Porokeratosis. No infection or ulcers  Diagnosis:  Tinea unguium, Pain in right toe, pain in left toes  Treatment & Plan Procedures and Treatment: Consent by patient was obtained for treatment procedures. The patient understood the discussion of treatment and procedures well. All questions were answered thoroughly reviewed. Debridement of mycotic and hypertrophic toenails, 1 through 5 bilateral and clearing of subungual debris. No ulceration, no infection noted. Check LOPS next visit. Return Visit-Office Procedure: Patient instructed to return to the office for a follow up visit 10 weeks  for continued evaluation and treatment.  Gardiner Barefoot DPM

## 2017-04-19 DIAGNOSIS — E7800 Pure hypercholesterolemia, unspecified: Secondary | ICD-10-CM | POA: Diagnosis not present

## 2017-04-19 DIAGNOSIS — Z7984 Long term (current) use of oral hypoglycemic drugs: Secondary | ICD-10-CM | POA: Diagnosis not present

## 2017-04-19 DIAGNOSIS — M19042 Primary osteoarthritis, left hand: Secondary | ICD-10-CM | POA: Diagnosis not present

## 2017-04-19 DIAGNOSIS — E119 Type 2 diabetes mellitus without complications: Secondary | ICD-10-CM | POA: Diagnosis not present

## 2017-04-19 DIAGNOSIS — Z0000 Encounter for general adult medical examination without abnormal findings: Secondary | ICD-10-CM | POA: Diagnosis not present

## 2017-04-19 DIAGNOSIS — M8588 Other specified disorders of bone density and structure, other site: Secondary | ICD-10-CM | POA: Diagnosis not present

## 2017-04-19 DIAGNOSIS — M65331 Trigger finger, right middle finger: Secondary | ICD-10-CM | POA: Diagnosis not present

## 2017-04-19 DIAGNOSIS — I10 Essential (primary) hypertension: Secondary | ICD-10-CM | POA: Diagnosis not present

## 2017-04-23 DIAGNOSIS — Z803 Family history of malignant neoplasm of breast: Secondary | ICD-10-CM | POA: Diagnosis not present

## 2017-04-23 DIAGNOSIS — Z1231 Encounter for screening mammogram for malignant neoplasm of breast: Secondary | ICD-10-CM | POA: Diagnosis not present

## 2017-05-25 DIAGNOSIS — E119 Type 2 diabetes mellitus without complications: Secondary | ICD-10-CM | POA: Diagnosis not present

## 2017-06-15 ENCOUNTER — Ambulatory Visit (INDEPENDENT_AMBULATORY_CARE_PROVIDER_SITE_OTHER): Payer: PPO | Admitting: Podiatry

## 2017-06-15 ENCOUNTER — Encounter: Payer: Self-pay | Admitting: Podiatry

## 2017-06-15 DIAGNOSIS — B351 Tinea unguium: Secondary | ICD-10-CM

## 2017-06-15 DIAGNOSIS — M79676 Pain in unspecified toe(s): Secondary | ICD-10-CM

## 2017-06-15 DIAGNOSIS — E119 Type 2 diabetes mellitus without complications: Secondary | ICD-10-CM

## 2017-06-15 NOTE — Progress Notes (Signed)
Patient ID: Cindy Rhodes, female   DOB: 08/27/1946, 71 y.o.   MRN: [id number] Complaint:  Visit Type: Patient returns to my office for continued preventative foot care services. Complaint: Patient states" my nails have grown long and thick and become painful to walk and wear shoes" Patient has been diagnosed with DM with no complications. He presents for preventative foot care services. No changes to ROS  Podiatric Exam: Vascular: dorsalis pedis and posterior tibial pulses are palpable bilateral. Capillary return is immediate. Temperature gradient is WNL. Skin turgor WNL  Sensorium: Normal Semmes Weinstein monofilament test. Normal tactile sensation bilaterally. Nail Exam: Pt has thick disfigured discolored nails with subungual debris noted bilateral entire nail hallux through fifth toenails Ulcer Exam: There is no evidence of ulcer or pre-ulcerative changes or infection. Orthopedic Exam: Muscle tone and strength are WNL. No limitations in general ROM. No crepitus or effusions noted. Foot type and digits show no abnormalities. Bony prominences are unremarkable. Skin: No Porokeratosis. No infection or ulcers  Diagnosis:  Tinea unguium, Pain in right toe, pain in left toes  Treatment & Plan Procedures and Treatment: Consent by patient was obtained for treatment procedures. The patient understood the discussion of treatment and procedures well. All questions were answered thoroughly reviewed. Debridement of mycotic and hypertrophic toenails, 1 through 5 bilateral and clearing of subungual debris. No ulceration, no infection noted.  Return Visit-Office Procedure: Patient instructed to return to the office for a follow up visit 10 weeks  for continued evaluation and treatment.  Marriah Sanderlin DPM 

## 2017-07-06 DIAGNOSIS — R3915 Urgency of urination: Secondary | ICD-10-CM | POA: Diagnosis not present

## 2017-08-07 DIAGNOSIS — R300 Dysuria: Secondary | ICD-10-CM | POA: Diagnosis not present

## 2017-08-07 DIAGNOSIS — N3000 Acute cystitis without hematuria: Secondary | ICD-10-CM | POA: Diagnosis not present

## 2017-08-12 ENCOUNTER — Inpatient Hospital Stay (HOSPITAL_COMMUNITY)
Admission: EM | Admit: 2017-08-12 | Discharge: 2017-08-15 | DRG: 690 | Disposition: A | Discharge status: Home / Self Care | Payer: PPO | Attending: Internal Medicine | Admitting: Internal Medicine

## 2017-08-12 ENCOUNTER — Encounter (HOSPITAL_COMMUNITY): Payer: Self-pay

## 2017-08-12 DIAGNOSIS — E876 Hypokalemia: Secondary | ICD-10-CM | POA: Diagnosis not present

## 2017-08-12 DIAGNOSIS — E871 Hypo-osmolality and hyponatremia: Secondary | ICD-10-CM | POA: Diagnosis present

## 2017-08-12 DIAGNOSIS — R112 Nausea with vomiting, unspecified: Secondary | ICD-10-CM | POA: Diagnosis not present

## 2017-08-12 DIAGNOSIS — Z79899 Other long term (current) drug therapy: Secondary | ICD-10-CM

## 2017-08-12 DIAGNOSIS — Z7982 Long term (current) use of aspirin: Secondary | ICD-10-CM

## 2017-08-12 DIAGNOSIS — Z87891 Personal history of nicotine dependence: Secondary | ICD-10-CM | POA: Diagnosis not present

## 2017-08-12 DIAGNOSIS — Z888 Allergy status to other drugs, medicaments and biological substances status: Secondary | ICD-10-CM

## 2017-08-12 DIAGNOSIS — Z7984 Long term (current) use of oral hypoglycemic drugs: Secondary | ICD-10-CM

## 2017-08-12 DIAGNOSIS — I10 Essential (primary) hypertension: Secondary | ICD-10-CM

## 2017-08-12 DIAGNOSIS — Z882 Allergy status to sulfonamides status: Secondary | ICD-10-CM

## 2017-08-12 DIAGNOSIS — N3000 Acute cystitis without hematuria: Secondary | ICD-10-CM | POA: Diagnosis not present

## 2017-08-12 DIAGNOSIS — E1165 Type 2 diabetes mellitus with hyperglycemia: Secondary | ICD-10-CM

## 2017-08-12 DIAGNOSIS — N390 Urinary tract infection, site not specified: Secondary | ICD-10-CM | POA: Diagnosis present

## 2017-08-12 DIAGNOSIS — E860 Dehydration: Secondary | ICD-10-CM | POA: Diagnosis present

## 2017-08-12 HISTORY — DX: Essential (primary) hypertension: I10

## 2017-08-12 HISTORY — DX: Type 2 diabetes mellitus with hyperglycemia: E11.65

## 2017-08-12 HISTORY — DX: Urinary tract infection, site not specified: N39.0

## 2017-08-12 HISTORY — DX: Hypo-osmolality and hyponatremia: E87.1

## 2017-08-12 HISTORY — DX: Hypokalemia: E87.6

## 2017-08-12 LAB — URINALYSIS, ROUTINE W REFLEX MICROSCOPIC
Bilirubin Urine: NEGATIVE
GLUCOSE, UA: NEGATIVE mg/dL
Ketones, ur: NEGATIVE mg/dL
Nitrite: NEGATIVE
PH: 7 (ref 5.0–8.0)
Protein, ur: NEGATIVE mg/dL
SPECIFIC GRAVITY, URINE: 1.006 (ref 1.005–1.030)

## 2017-08-12 LAB — CBC WITH DIFFERENTIAL/PLATELET
BASOS ABS: 0 10*3/uL (ref 0.0–0.1)
Basophils Relative: 0 %
EOS PCT: 1 %
Eosinophils Absolute: 0.1 10*3/uL (ref 0.0–0.7)
HEMATOCRIT: 34.6 % — AB (ref 36.0–46.0)
HEMOGLOBIN: 12.9 g/dL (ref 12.0–15.0)
LYMPHS ABS: 1.1 10*3/uL (ref 0.7–4.0)
LYMPHS PCT: 8 %
MCH: 29 pg (ref 26.0–34.0)
MCHC: 37.3 g/dL — AB (ref 30.0–36.0)
MCV: 77.8 fL — ABNORMAL LOW (ref 78.0–100.0)
MONO ABS: 1.2 10*3/uL — AB (ref 0.1–1.0)
Monocytes Relative: 9 %
Neutro Abs: 11.3 10*3/uL — ABNORMAL HIGH (ref 1.7–7.7)
Neutrophils Relative %: 82 %
Platelets: 294 10*3/uL (ref 150–400)
RBC: 4.45 MIL/uL (ref 3.87–5.11)
RDW: 12.6 % (ref 11.5–15.5)
WBC: 13.7 10*3/uL — ABNORMAL HIGH (ref 4.0–10.5)

## 2017-08-12 LAB — BASIC METABOLIC PANEL
Anion gap: 11 (ref 5–15)
BUN: 15 mg/dL (ref 6–20)
CHLORIDE: 87 mmol/L — AB (ref 101–111)
CO2: 27 mmol/L (ref 22–32)
Calcium: 8.7 mg/dL — ABNORMAL LOW (ref 8.9–10.3)
Creatinine, Ser: 0.67 mg/dL (ref 0.44–1.00)
GFR calc Af Amer: >60 mL/min (ref >60)
GFR calc non Af Amer: >60 mL/min (ref >60)
Glucose, Bld: 258 mg/dL — ABNORMAL HIGH (ref 65–99)
Potassium: 2.2 mmol/L — CL (ref 3.5–5.1)
Sodium: 125 mmol/L — ABNORMAL LOW (ref 135–145)

## 2017-08-12 LAB — I-STAT CG4 LACTIC ACID, ED: Lactic Acid, Venous: 1.48 mmol/L (ref 0.5–1.9)

## 2017-08-12 MED ORDER — ACETAMINOPHEN 325 MG PO TABS: 650.0000 mg | ORAL_TABLET | Freq: Four times a day (QID) | ORAL | Status: DC | PRN

## 2017-08-12 MED ORDER — ENOXAPARIN SODIUM 40 MG/0.4ML ~~LOC~~ SOLN
40.0000 mg | Freq: Every day | SUBCUTANEOUS | Status: DC
  Administered 2017-08-13 – 2017-08-14 (×2): 40 mg via SUBCUTANEOUS
  Filled 2017-08-12 (×3): qty 0.4

## 2017-08-12 MED ORDER — ONDANSETRON HCL 4 MG PO TABS: 4.0000 mg | ORAL_TABLET | Freq: Four times a day (QID) | ORAL | Status: DC | PRN

## 2017-08-12 MED ORDER — ONDANSETRON HCL 4 MG/2ML IJ SOLN: 4.0000 mg | Freq: Four times a day (QID) | INTRAMUSCULAR | Status: DC | PRN

## 2017-08-12 MED ORDER — ACETAMINOPHEN 650 MG RE SUPP: 650.0000 mg | Freq: Four times a day (QID) | RECTAL | Status: DC | PRN

## 2017-08-12 MED ORDER — DEXTROSE 5 % IV SOLN
2.0000 g | INTRAVENOUS | Status: DC
  Administered 2017-08-13: 2 g via INTRAVENOUS
  Filled 2017-08-12: qty 2

## 2017-08-12 MED ORDER — SODIUM CHLORIDE 0.9 % IV BOLUS (SEPSIS)
1000.0000 mL | Freq: Once | INTRAVENOUS | Status: AC
  Administered 2017-08-12: 1000 mL via INTRAVENOUS

## 2017-08-12 MED ORDER — FAMOTIDINE 20 MG PO TABS
20.0000 mg | ORAL_TABLET | Freq: Two times a day (BID) | ORAL | Status: DC
  Administered 2017-08-14 – 2017-08-15 (×3): 20 mg via ORAL
  Filled 2017-08-12 (×6): qty 1

## 2017-08-12 MED ORDER — SERTRALINE HCL 50 MG PO TABS
75.0000 mg | ORAL_TABLET | Freq: Every day | ORAL | Status: DC
  Administered 2017-08-13 – 2017-08-15 (×3): 75 mg via ORAL
  Filled 2017-08-12 (×3): qty 1

## 2017-08-12 MED ORDER — POTASSIUM CHLORIDE IN NACL 20-0.9 MEQ/L-% IV SOLN
INTRAVENOUS | Status: AC
  Administered 2017-08-13 (×2): via INTRAVENOUS
  Filled 2017-08-12 (×2): qty 1000

## 2017-08-12 MED ORDER — ROSUVASTATIN CALCIUM 5 MG PO TABS
2.5000 mg | ORAL_TABLET | Freq: Every day | ORAL | Status: DC
  Administered 2017-08-13 – 2017-08-14 (×3): 2.5 mg via ORAL
  Filled 2017-08-12 (×3): qty 1

## 2017-08-12 MED ORDER — POTASSIUM CHLORIDE CRYS ER 20 MEQ PO TBCR
40.0000 meq | EXTENDED_RELEASE_TABLET | Freq: Once | ORAL | Status: AC
  Administered 2017-08-13: 40 meq via ORAL

## 2017-08-12 MED ORDER — METOPROLOL TARTRATE 25 MG PO TABS
25.0000 mg | ORAL_TABLET | Freq: Two times a day (BID) | ORAL | Status: DC
  Administered 2017-08-13 – 2017-08-15 (×6): 25 mg via ORAL
  Filled 2017-08-12 (×6): qty 1

## 2017-08-12 MED ORDER — DEXTROSE 5 % IV SOLN
1.0000 g | Freq: Once | INTRAVENOUS | Status: AC
  Administered 2017-08-12: 1 g via INTRAVENOUS
  Filled 2017-08-12: qty 10

## 2017-08-12 MED ORDER — INSULIN ASPART 100 UNIT/ML ~~LOC~~ SOLN
0.0000 [IU] | Freq: Three times a day (TID) | SUBCUTANEOUS | Status: DC
  Administered 2017-08-13: 2 [IU] via SUBCUTANEOUS
  Administered 2017-08-13: 7 [IU] via SUBCUTANEOUS
  Administered 2017-08-13: 3 [IU] via SUBCUTANEOUS
  Administered 2017-08-14: 2 [IU] via SUBCUTANEOUS
  Administered 2017-08-14: 7 [IU] via SUBCUTANEOUS
  Administered 2017-08-14: 3 [IU] via SUBCUTANEOUS
  Administered 2017-08-15: 7 [IU] via SUBCUTANEOUS
  Administered 2017-08-15: 2 [IU] via SUBCUTANEOUS

## 2017-08-12 MED ORDER — POTASSIUM CHLORIDE 10 MEQ/100ML IV SOLN
10.0000 meq | INTRAVENOUS | Status: AC
  Administered 2017-08-12 – 2017-08-13 (×3): 10 meq via INTRAVENOUS
  Filled 2017-08-12: qty 100

## 2017-08-12 MED ORDER — ASPIRIN EC 81 MG PO TBEC
81.0000 mg | DELAYED_RELEASE_TABLET | Freq: Every day | ORAL | Status: DC
  Administered 2017-08-13 – 2017-08-14 (×3): 81 mg via ORAL
  Filled 2017-08-12 (×3): qty 1

## 2017-08-12 MED ORDER — MAGNESIUM SULFATE IN D5W 1-5 GM/100ML-% IV SOLN
1.0000 g | Freq: Once | INTRAVENOUS | Status: AC
Start: 2017-08-12 — End: 2017-08-13
  Administered 2017-08-13: 1 g via INTRAVENOUS
  Filled 2017-08-12: qty 100

## 2017-08-12 MED ORDER — HYDRALAZINE HCL 20 MG/ML IJ SOLN: 10.0000 mg | INTRAMUSCULAR | Status: DC | PRN

## 2017-08-12 MED ORDER — AMLODIPINE BESYLATE 5 MG PO TABS
5.0000 mg | ORAL_TABLET | Freq: Every day | ORAL | Status: DC
  Administered 2017-08-13 – 2017-08-15 (×3): 5 mg via ORAL
  Filled 2017-08-12 (×3): qty 1

## 2017-08-12 MED ORDER — MAGNESIUM SULFATE 50 % IJ SOLN: 1.0000 g | Freq: Once | INTRAMUSCULAR | Status: DC

## 2017-08-12 MED ORDER — POTASSIUM CHLORIDE CRYS ER 20 MEQ PO TBCR
40.0000 meq | EXTENDED_RELEASE_TABLET | Freq: Once | ORAL | Status: AC
  Administered 2017-08-12: 40 meq via ORAL
  Filled 2017-08-12: qty 2

## 2017-08-12 MED ORDER — CALCIUM CARBONATE-VITAMIN D 500-200 MG-UNIT PO TABS
1.0000 | ORAL_TABLET | Freq: Two times a day (BID) | ORAL | Status: DC
  Administered 2017-08-13 – 2017-08-15 (×6): 1 via ORAL
  Filled 2017-08-12 (×5): qty 1

## 2017-08-12 NOTE — ED Notes (Signed)
Please call report to [facility], [phone] at [phone]

## 2017-08-12 NOTE — ED Provider Notes (Signed)
[facility] DEPT Provider Note   CSN: [id number] Arrival date & time: 08/12/17  1920     History   Chief Complaint Chief Complaint  Patient presents with  . Recurrent UTI    HPI Cindy Rhodes is a 71 y.o. female.  HPI Pt started having some urinary sx one week ago.    She started to feel feverish, and chilled.  She had several episodes of vomiting on Friday.   She noticed her urine had a foul odor and the urine was cloudy. She went to an urgent care.  She was diagnosed with a UTI and was started on macrobid.  The urine looks better but she still felt chilled and weak.  She still does not have much of an appetite.  She feels weak and dehydrated. She has not vomited since Saturday. She went to see her internist today.  SHe had lab tests in the office.  She was called today and was told her WBC was 1k and she should come to the ED.  Past Medical History:  Diagnosis Date  . Depression   . Diabetes mellitus without complication ([place])   . Hypertension     Patient Active Problem List   Diagnosis Date Noted  . Hypokalemia 08/12/2017  . Hyponatremia 08/12/2017  . Essential hypertension 08/12/2017  . Type 2 diabetes mellitus with hyperglycemia ([place]) 08/12/2017  . Acute lower UTI 08/12/2017    Past Surgical History:  Procedure Laterality Date  . ABDOMINAL HYSTERECTOMY    . HAND SURGERY Left     OB History    No data available       Home Medications    Prior to Admission medications   Medication Sig Start Date End Date Taking? Authorizing Provider  amLODipine (NORVASC) 5 MG tablet Take 5 mg by mouth daily.   Yes [provider]  aspirin EC 81 MG tablet Take 81 mg by mouth at bedtime.   Yes [provider]  Calcium Carbonate-Vitamin D (CALTRATE 600+D) 600-400 MG-UNIT per tablet Take 1 tablet by mouth 2 (two) times daily.   Yes [provider]  Coenzyme Q10 (COQ-10) 50 MG CAPS Take 50 mg by mouth daily.   Yes [provider]    Cranberry 500 MG CAPS Take 500 mg by mouth at bedtime.   Yes [provider]  CRESTOR 5 MG tablet Take 2.5 mg by mouth at bedtime.  10/06/13  Yes [provider]  glucosamine-chondroitin 500-400 MG tablet Take 1 tablet by mouth 3 (three) times daily.   Yes [provider]  ibuprofen (ADVIL,MOTRIN) 200 MG tablet Take 600 mg by mouth daily as needed (pain).   Yes [provider]  liver oil-zinc oxide (DESITIN) 40 % ointment Apply 1 application topically at bedtime as needed (yeast). Apply to each side of mouth at night   Yes [provider]  losartan-hydrochlorothiazide (HYZAAR) 100-25 MG per tablet Take 1 tablet by mouth daily.   Yes [provider]  Melatonin-Pyridoxine (MELATIN PO) Take 5 mg by mouth daily at 2 PM.   Yes [provider]  metFORMIN (GLUCOPHAGE) 500 MG tablet Take 500 mg by mouth 2 (two) times daily with a meal.   Yes [provider]  metoprolol tartrate (LOPRESSOR) 25 MG tablet Take 25 mg by mouth 2 (two) times daily.  09/30/13  Yes [provider]  Misc Natural Products (TART CHERRY ADVANCED) CAPS Take by mouth daily at 2 PM.   Yes [provider]  Multiple  Vitamin (MULTIVITAMIN WITH MINERALS) TABS tablet Take 1 tablet by mouth daily.   Yes [provider]  ranitidine (ZANTAC) 75 MG tablet Take 75 mg by mouth daily. Take with ibuprofen   Yes [provider]  sertraline (ZOLOFT) 50 MG tablet Take 75 mg by mouth daily.  07/22/13  Yes [provider]  vitamin C (ASCORBIC ACID) 500 MG tablet Take 500 mg by mouth daily.   Yes [provider]    Family History Family History  Problem Relation Age of Onset  . Hypertension Other     Social History Social History  Substance Use Topics  . Smoking status: Former Research scientist (life sciences)  . Smokeless tobacco: Never Used     Comment: quit years ago   . Alcohol use No     Allergies   Meloxicam and Sulfa  antibiotics   Review of Systems Review of Systems  All other systems reviewed and are negative.    Physical Exam Updated Vital Signs BP (!) 158/69 (BP Location: Right Arm)   Pulse 69   Temp 99 F (37.2 C) (Oral)   Resp 16   Ht 1.549 m (5\' 1" )   Wt 71.1 kg (156 lb 12 oz)   SpO2 98%   BMI 29.62 kg/m   Physical Exam  Constitutional: She appears well-developed and well-nourished. No distress.  HENT:  Head: Normocephalic and atraumatic.  Right Ear: External ear normal.  Left Ear: External ear normal.  Eyes: Conjunctivae are normal. Right eye exhibits no discharge. Left eye exhibits no discharge. No scleral icterus.  Neck: Neck supple. No tracheal deviation present.  Cardiovascular: Normal rate, regular rhythm and intact distal pulses.   Pulmonary/Chest: Effort normal and breath sounds normal. No stridor. No respiratory distress. She has no wheezes. She has no rales.  Abdominal: Soft. Bowel sounds are normal. She exhibits no distension. There is no tenderness. There is no rebound and no guarding.  Musculoskeletal: She exhibits no edema or tenderness.  Neurological: She is alert. She has normal strength. No cranial nerve deficit (no facial droop, extraocular movements intact, no slurred speech) or sensory deficit. She exhibits normal muscle tone. She displays no seizure activity. Coordination normal.  Skin: Skin is warm and dry. No rash noted.  Psychiatric: She has a normal mood and affect.  Nursing note and vitals reviewed.    ED Treatments / Results  Labs (all labs ordered are listed, but only abnormal results are displayed) Labs Reviewed  URINALYSIS, ROUTINE W REFLEX MICROSCOPIC - Abnormal; Notable for the following:       Result Value   Hgb urine dipstick MODERATE (*)    Leukocytes, UA LARGE (*)    Bacteria, UA RARE (*)    Squamous Epithelial / LPF 0-5 (*)    All other components within normal limits  CBC WITH DIFFERENTIAL/PLATELET - Abnormal; Notable for the  following:    WBC 13.7 (*)    HCT 34.6 (*)    MCV 77.8 (*)    MCHC 37.3 (*)    Neutro Abs 11.3 (*)    Monocytes Absolute 1.2 (*)    All other components within normal limits  BASIC METABOLIC PANEL - Abnormal; Notable for the following:    Sodium 125 (*)    Potassium 2.2 (*)    Chloride 87 (*)    Glucose, Bld 258 (*)    Calcium 8.7 (*)    All other components within normal limits  BASIC METABOLIC PANEL - Abnormal; Notable for the following:  Sodium 128 (*)    Potassium 2.6 (*)    Chloride 95 (*)    Glucose, Bld 281 (*)    Calcium 8.1 (*)    All other components within normal limits  CBC - Abnormal; Notable for the following:    WBC 13.2 (*)    Hemoglobin 11.6 (*)    HCT 31.7 (*)    MCHC 36.6 (*)    All other components within normal limits  GLUCOSE, CAPILLARY - Abnormal; Notable for the following:    Glucose-Capillary 304 (*)    All other components within normal limits  HEMOGLOBIN A1C - Abnormal; Notable for the following:    Hgb A1c MFr Bld 7.7 (*)    All other components within normal limits  GLUCOSE, CAPILLARY - Abnormal; Notable for the following:    Glucose-Capillary 209 (*)    All other components within normal limits  BASIC METABOLIC PANEL - Abnormal; Notable for the following:    Sodium 131 (*)    Glucose, Bld 192 (*)    Calcium 7.8 (*)    All other components within normal limits  CBC - Abnormal; Notable for the following:    WBC 13.1 (*)    Hemoglobin 11.6 (*)    HCT 32.7 (*)    All other components within normal limits  BASIC METABOLIC PANEL - Abnormal; Notable for the following:    Glucose, Bld 203 (*)    Calcium 8.3 (*)    All other components within normal limits  GLUCOSE, CAPILLARY - Abnormal; Notable for the following:    Glucose-Capillary 170 (*)    All other components within normal limits  GLUCOSE, CAPILLARY - Abnormal; Notable for the following:    Glucose-Capillary 278 (*)    All other components within normal limits  URINE CULTURE   SODIUM, URINE, RANDOM  TSH  MAGNESIUM  I-STAT CG4 LACTIC ACID, ED     Radiology No results found.  Procedures Procedures (including critical care time)  Medications Ordered in ED Medications  amLODipine (NORVASC) tablet 5 mg (5 mg Oral Given 08/13/17 0849)  aspirin EC tablet 81 mg (81 mg Oral Given 08/13/17 2125)  calcium-vitamin D (OSCAL WITH D) 500-200 MG-UNIT per tablet 1 tablet (1 tablet Oral Given 08/13/17 2125)  famotidine (PEPCID) tablet 20 mg (20 mg Oral Not Given 08/13/17 2128)  rosuvastatin (CRESTOR) tablet 2.5 mg (2.5 mg Oral Given 08/13/17 2124)  metoprolol tartrate (LOPRESSOR) tablet 25 mg (25 mg Oral Given 08/13/17 2123)  sertraline (ZOLOFT) tablet 75 mg (75 mg Oral Given 08/13/17 0849)  hydrALAZINE (APRESOLINE) injection 10 mg (not administered)  acetaminophen (TYLENOL) tablet 650 mg (not administered)    Or  acetaminophen (TYLENOL) suppository 650 mg (not administered)  ondansetron (ZOFRAN) tablet 4 mg (not administered)    Or  ondansetron (ZOFRAN) injection 4 mg (not administered)  insulin aspart (novoLOG) injection 0-9 Units (2 Units Subcutaneous Given 08/13/17 1755)  enoxaparin (LOVENOX) injection 40 mg (40 mg Subcutaneous Given 08/13/17 2128)  0.9 % NaCl with KCl 20 mEq/ L  infusion ( Intravenous New Bag/Given 08/13/17 1432)  cefTRIAXone (ROCEPHIN) 1 g in dextrose 5 % 50 mL IVPB (not administered)  sodium chloride 0.9 % bolus 1,000 mL (0 mLs Intravenous Stopped 08/12/17 2300)  cefTRIAXone (ROCEPHIN) 1 g in dextrose 5 % 50 mL IVPB (0 g Intravenous Stopped 08/12/17 2216)  potassium chloride 10 mEq in 100 mL IVPB (0 mEq Intravenous Stopped 08/13/17 0304)  potassium chloride SA (K-DUR,KLOR-CON) CR tablet 40 mEq (40 mEq Oral  Given 08/12/17 2240)  magnesium sulfate IVPB 1 g 100 mL (0 g Intravenous Stopped 08/13/17 0404)  potassium chloride SA (K-DUR,KLOR-CON) CR tablet 40 mEq (40 mEq Oral Given 08/13/17 0052)  potassium chloride SA (K-DUR,KLOR-CON) CR tablet 40 mEq (40 mEq Oral  Given 08/13/17 2125)  potassium chloride 10 mEq in 100 mL IVPB (0 mEq Intravenous Stopped 08/13/17 0830)  sodium chloride 0.9 % bolus 500 mL (0 mLs Intravenous Stopped 08/13/17 1411)     Initial Impression / Assessment and Plan / ED Course  I have reviewed the triage vital signs and the nursing notes.  Pertinent labs & imaging results that were available during my care of the patient were reviewed by me and considered in my medical decision making (see chart for details).  Clinical Course as of Aug 15 703  Thu Aug 12, 2017  2102 9/8 urine culture results.  E coli.  Pan sensitive  [JK]  2111 Attempted to look at [facility] through care everywhere.  No results.   [JK]    Clinical Course User Index [JK] Dorie Rank, MD    Pt has a persistent uti.  Sensitivities from visit in ca show pansensitivity.  Hyponatremia noted on labs but pt does have a history of hyponatremia.  No recent labs to see if this is chronic for many years or acute recently.  Hypokalemia also noted and likely contributing to her weakness. Plan on admission, IV abx, electrolyte replacement.  Final Clinical Impressions(s) / ED Diagnoses   Final diagnoses:  Acute cystitis without hematuria  Hypokalemia  Hyponatremia      Dorie Rank, MD 08/14/17 [phone]

## 2017-08-12 NOTE — Progress Notes (Signed)
PHARMACY NOTE -  ANTIBIOTIC RENAL DOSE ADJUSTMENT   Request received for Pharmacy to assist with antibiotic renal dose adjustment.  Patient has been initiated on Ceftriaxone 2gm iv q24hr  for UTI. SCr 0.67, estimated CrCl ~80N ml/min Current dosage is appropriate and need for further dosage adjustment appears unlikely at present. Will sign off at this time.  Please reconsult if a change in clinical status warrants re-evaluation of dosage.

## 2017-08-12 NOTE — ED Notes (Signed)
Date and time results received: 08/12/17 2215  Test:Potassium level Critical Value: 2.2 Name of Provider Notified: Dr. Hillard Danker  Orders Received? Or Actions Taken?: see orders

## 2017-08-12 NOTE — H&P (Signed)
History and Physical    ELLEAN FIRMAN XVQ:[id number] DOB: Aug 19, 1946 DOA: 08/12/2017  PCP: Seward Carol, MD  Patient coming from: Home.  Chief Complaint: Weakness.  HPI: Cindy Rhodes is a 71 y.o. female with history of diabetes mellitus type 2, hypertension presents to the ER because of patient feeling increasingly weak with poor appetite. In July 2 months ago patient was diagnosed with UTI and was on Macrobid for 1 week. Last week patient felt generally unwell and had gone to [place]. In [place] patient had foul-smelling urine and started developing fever and chills. Patient had gone to the ER and was prescribed Macrobid. After taking Macrobid patient's urine became more clear. But 2 days later patient started developing nausea and poor appetite. Had one or 2 episodes of diarrhea. Patient has been doing poorly and had gone to her PCP today and had labs done which showed hyponatremia and hypokalemia and was instructed to come to the ER.   ED Course: In the ER labs revealed hyponatremia and severe hypokalemia. UA is consistent with UTI. Patient was started on ceftriaxone for UTI and potassium replacement and IV fluids started for hyponatremia and hypokalemia. Admitted for further observation.  Review of Systems: As per HPI, rest all negative.   Past Medical History:  Diagnosis Date  . Depression   . Diabetes mellitus without complication ([place])   . Hypertension     Past Surgical History:  Procedure Laterality Date  . ABDOMINAL HYSTERECTOMY    . HAND SURGERY Left      reports that she has quit smoking. She has never used smokeless tobacco. She reports that she does not drink alcohol or use drugs.  Allergies  Allergen Reactions  . Meloxicam Other (See Comments)    Dizzy and nervous   . Sulfa Antibiotics Other (See Comments)    Shuts down urinary system    Family History  Problem Relation Age of Onset  . Hypertension Other     Prior to Admission medications     Medication Sig Start Date End Date Taking? Authorizing Provider  amLODipine (NORVASC) 5 MG tablet Take 5 mg by mouth daily.   Yes [provider]  aspirin EC 81 MG tablet Take 81 mg by mouth at bedtime.   Yes [provider]  Calcium Carbonate-Vitamin D (CALTRATE 600+D) 600-400 MG-UNIT per tablet Take 1 tablet by mouth 2 (two) times daily.   Yes [provider]  Coenzyme Q10 (COQ-10) 50 MG CAPS Take 50 mg by mouth daily.   Yes [provider]  Cranberry 500 MG CAPS Take 500 mg by mouth at bedtime.   Yes [provider]  CRESTOR 5 MG tablet Take 2.5 mg by mouth at bedtime.  10/06/13  Yes [provider]  glucosamine-chondroitin 500-400 MG tablet Take 1 tablet by mouth 3 (three) times daily.   Yes [provider]  ibuprofen (ADVIL,MOTRIN) 200 MG tablet Take 600 mg by mouth daily as needed (pain).   Yes [provider]  liver oil-zinc oxide (DESITIN) 40 % ointment Apply 1 application topically at bedtime as needed (yeast). Apply to each side of mouth at night   Yes [provider]  losartan-hydrochlorothiazide (HYZAAR) 100-25 MG per tablet Take 1 tablet by mouth daily.   Yes [provider]  Melatonin-Pyridoxine (MELATIN PO) Take 5 mg by mouth daily at 2 PM.   Yes [provider]  metFORMIN (GLUCOPHAGE) 500 MG tablet Take 500 mg by mouth 2 (two) times daily with  a meal.   Yes [provider]  metoprolol tartrate (LOPRESSOR) 25 MG tablet Take 25 mg by mouth 2 (two) times daily.  09/30/13  Yes [provider]  Misc Natural Products (TART CHERRY ADVANCED) CAPS Take by mouth daily at 2 PM.   Yes [provider]  Multiple Vitamin (MULTIVITAMIN WITH MINERALS) TABS tablet Take 1 tablet by mouth daily.   Yes [provider]  ranitidine (ZANTAC) 75 MG tablet Take 75 mg by mouth daily. Take with ibuprofen   Yes [provider]  sertraline (ZOLOFT) 50 MG tablet Take 75 mg  by mouth daily.  07/22/13  Yes [provider]  vitamin C (ASCORBIC ACID) 500 MG tablet Take 500 mg by mouth daily.   Yes [provider]    Physical Exam: Vitals:   08/12/17 1953 08/12/17 2215  BP: 137/70 (!) 158/73  Pulse: 78 72  Resp: 18 18  Temp: 98.2 F (36.8 C) 97.7 F (36.5 C)  TempSrc: Oral Oral  SpO2: 100% 98%      Constitutional: Moderately built and nourished. Vitals:   08/12/17 1953 08/12/17 2215  BP: 137/70 (!) 158/73  Pulse: 78 72  Resp: 18 18  Temp: 98.2 F (36.8 C) 97.7 F (36.5 C)  TempSrc: Oral Oral  SpO2: 100% 98%   Eyes: Anicteric. No pallor. ENMT: No discharge from the ears eyes nose and mouth. Neck: No mass felt. No JVD appreciated. Respiratory: No rhonchi or crepitations. Cardiovascular: S1 and S2 heard no murmurs appreciated. Abdomen: Soft nontender bowel sounds present. No guarding or rigidity. Musculoskeletal: No edema. No joint effusion. Skin: No rash. Skin appears warm. Neurologic: Alert awake oriented to time place and person. Moves all extremities. Psychiatric: Appears normal. Normal affect.   Labs on Admission: I have personally reviewed following labs and imaging studies  CBC:  Recent Labs Lab 08/12/17 2129  WBC 13.7*  NEUTROABS 11.3*  HGB 12.9  HCT 34.6*  MCV 77.8*  PLT 416   Basic Metabolic Panel:  Recent Labs Lab 08/12/17 2129  NA 125*  K 2.2*  CL 87*  CO2 27  GLUCOSE 258*  BUN 15  CREATININE 0.67  CALCIUM 8.7*   GFR: CrCl cannot be calculated (Unknown ideal weight.). Liver Function Tests: No results for input(s): AST, ALT, ALKPHOS, BILITOT, PROT, ALBUMIN in the last 168 hours. No results for input(s): LIPASE, AMYLASE in the last 168 hours. No results for input(s): AMMONIA in the last 168 hours. Coagulation Profile: No results for input(s): INR, PROTIME in the last 168 hours. Cardiac Enzymes: No results for input(s): CKTOTAL, CKMB, CKMBINDEX, TROPONINI in the last 168 hours. BNP (last  3 results) No results for input(s): PROBNP in the last 8760 hours. HbA1C: No results for input(s): HGBA1C in the last 72 hours. CBG: No results for input(s): GLUCAP in the last 168 hours. Lipid Profile: No results for input(s): CHOL, HDL, LDLCALC, TRIG, CHOLHDL, LDLDIRECT in the last 72 hours. Thyroid Function Tests: No results for input(s): TSH, T4TOTAL, FREET4, T3FREE, THYROIDAB in the last 72 hours. Anemia Panel: No results for input(s): VITAMINB12, FOLATE, FERRITIN, TIBC, IRON, RETICCTPCT in the last 72 hours. Urine analysis:    Component Value Date/Time   COLORURINE YELLOW 08/12/2017 2034   APPEARANCEUR CLEAR 08/12/2017 2034   LABSPEC 1.006 08/12/2017 2034   PHURINE 7.0 08/12/2017 2034   GLUCOSEU NEGATIVE 08/12/2017 2034   HGBUR MODERATE (A) 08/12/2017 2034   BILIRUBINUR NEGATIVE 08/12/2017 2034   KETONESUR NEGATIVE 08/12/2017 2034   PROTEINUR NEGATIVE  08/12/2017 2034   UROBILINOGEN 0.2 04/02/2014 1222   NITRITE NEGATIVE 08/12/2017 2034   LEUKOCYTESUR LARGE (A) 08/12/2017 2034   Sepsis Labs: @LABRCNTIP (procalcitonin:4,lacticidven:4) )No results found for this or any previous visit (from the past 240 hour(s)).   Radiological Exams on Admission: No results found.   Assessment/Plan Active Problems:   Hypokalemia   Hyponatremia   Essential hypertension   Type 2 diabetes mellitus with hyperglycemia (HCC)   Acute lower UTI    1. Severe hypokalemia and hyponatremia likely from dehydration and poor oral intake - patient has not been eating well for last 2-3 days with poor oral intake and has been taking her hydrochlorothiazide. Likely causing patient's hyponatremia and hypokalemia. Replace and recheck. Continue with gentle hydration. Patient's poor appetite could be from UTI. 2. UTI - on ceftriaxone. Cultures done in [place] showed Escherichia coli. Results available care everywhere. 3. Hypertension -holding hydrochlorothiazide due to hypokalemia and hyponatremia.  Continue Cozaar. 4. Diabetes mellitus type 2 - will place patient on sliding scale coverage.  I have reviewed patient's old charts and labs.    DVT prophylaxis: Lovenox.  Code Status: Full code.  Family Communication: Husband.  Disposition Plan: Home.  Consults called: None.  Admission status: Observation.    Rise Patience MD [facility] Pager [phone].  If 7PM-7AM, please contact night-coverage [email or web address] Password [employer]  08/12/2017, 11:17 PM

## 2017-08-12 NOTE — ED Triage Notes (Signed)
Pt had blood work done at primary office and her WBC were elevated and they told her to come here Pt has had recurrent UTI's treated with macrobid with some relief Pt had a fever on Friday and has vomited several times

## 2017-08-13 DIAGNOSIS — E1165 Type 2 diabetes mellitus with hyperglycemia: Secondary | ICD-10-CM | POA: Diagnosis present

## 2017-08-13 DIAGNOSIS — N3000 Acute cystitis without hematuria: Secondary | ICD-10-CM | POA: Diagnosis present

## 2017-08-13 DIAGNOSIS — Z87891 Personal history of nicotine dependence: Secondary | ICD-10-CM | POA: Diagnosis not present

## 2017-08-13 DIAGNOSIS — Z79899 Other long term (current) drug therapy: Secondary | ICD-10-CM | POA: Diagnosis not present

## 2017-08-13 DIAGNOSIS — E876 Hypokalemia: Secondary | ICD-10-CM

## 2017-08-13 DIAGNOSIS — N390 Urinary tract infection, site not specified: Secondary | ICD-10-CM | POA: Diagnosis present

## 2017-08-13 DIAGNOSIS — E871 Hypo-osmolality and hyponatremia: Secondary | ICD-10-CM

## 2017-08-13 DIAGNOSIS — Z7984 Long term (current) use of oral hypoglycemic drugs: Secondary | ICD-10-CM | POA: Diagnosis not present

## 2017-08-13 DIAGNOSIS — Z7982 Long term (current) use of aspirin: Secondary | ICD-10-CM | POA: Diagnosis not present

## 2017-08-13 DIAGNOSIS — Z888 Allergy status to other drugs, medicaments and biological substances status: Secondary | ICD-10-CM | POA: Diagnosis not present

## 2017-08-13 DIAGNOSIS — I10 Essential (primary) hypertension: Secondary | ICD-10-CM | POA: Diagnosis present

## 2017-08-13 DIAGNOSIS — E860 Dehydration: Secondary | ICD-10-CM | POA: Diagnosis present

## 2017-08-13 DIAGNOSIS — Z882 Allergy status to sulfonamides status: Secondary | ICD-10-CM | POA: Diagnosis not present

## 2017-08-13 LAB — BASIC METABOLIC PANEL
ANION GAP: 9 (ref 5–15)
Anion gap: 7 (ref 5–15)
BUN: 11 mg/dL (ref 6–20)
BUN: 10 mg/dL (ref 6–20)
CALCIUM: 8.1 mg/dL — AB (ref 8.9–10.3)
CO2: 24 mmol/L (ref 22–32)
CO2: 23 mmol/L (ref 22–32)
CREATININE: 0.55 mg/dL (ref 0.44–1.00)
Calcium: 7.8 mg/dL — ABNORMAL LOW (ref 8.9–10.3)
Chloride: 95 mmol/L — ABNORMAL LOW (ref 101–111)
Chloride: 101 mmol/L (ref 101–111)
Creatinine, Ser: 0.6 mg/dL (ref 0.44–1.00)
GFR calc Af Amer: >60 mL/min (ref >60)
GLUCOSE: 281 mg/dL — AB (ref 65–99)
GLUCOSE: 192 mg/dL — AB (ref 65–99)
POTASSIUM: 2.6 mmol/L — AB (ref 3.5–5.1)
POTASSIUM: 3.8 mmol/L (ref 3.5–5.1)
SODIUM: 128 mmol/L — AB (ref 135–145)
SODIUM: 131 mmol/L — AB (ref 135–145)

## 2017-08-13 LAB — HEMOGLOBIN A1C
HEMOGLOBIN A1C: 7.7 % — AB (ref 4.8–5.6)
MEAN PLASMA GLUCOSE: 174.29 mg/dL

## 2017-08-13 LAB — CBC
HEMATOCRIT: 31.7 % — AB (ref 36.0–46.0)
HEMOGLOBIN: 11.6 g/dL — AB (ref 12.0–15.0)
MCH: 29.2 pg (ref 26.0–34.0)
MCHC: 36.6 g/dL — ABNORMAL HIGH (ref 30.0–36.0)
MCV: 79.8 fL (ref 78.0–100.0)
Platelets: 275 10*3/uL (ref 150–400)
RBC: 3.97 MIL/uL (ref 3.87–5.11)
RDW: 12.7 % (ref 11.5–15.5)
WBC: 13.2 10*3/uL — AB (ref 4.0–10.5)

## 2017-08-13 LAB — GLUCOSE, CAPILLARY
GLUCOSE-CAPILLARY: 304 mg/dL — AB (ref 65–99)
GLUCOSE-CAPILLARY: 209 mg/dL — AB (ref 65–99)
GLUCOSE-CAPILLARY: 170 mg/dL — AB (ref 65–99)

## 2017-08-13 LAB — TSH: TSH: 1.132 u[IU]/mL (ref 0.350–4.500)

## 2017-08-13 LAB — MAGNESIUM: MAGNESIUM: 1.8 mg/dL (ref 1.7–2.4)

## 2017-08-13 LAB — SODIUM, URINE, RANDOM: SODIUM UR: 16 mmol/L

## 2017-08-13 MED ORDER — SODIUM CHLORIDE 0.9 % IV BOLUS (SEPSIS)
500.0000 mL | Freq: Once | INTRAVENOUS | Status: AC
  Administered 2017-08-13: 500 mL via INTRAVENOUS

## 2017-08-13 MED ORDER — DEXTROSE 5 % IV SOLN
1.0000 g | INTRAVENOUS | Status: DC
  Administered 2017-08-14: 1 g via INTRAVENOUS
  Filled 2017-08-13 (×2): qty 10

## 2017-08-13 MED ORDER — POTASSIUM CHLORIDE 10 MEQ/100ML IV SOLN
10.0000 meq | INTRAVENOUS | Status: AC
  Administered 2017-08-13 (×2): 10 meq via INTRAVENOUS
  Filled 2017-08-13 (×2): qty 100

## 2017-08-13 MED ORDER — LOSARTAN POTASSIUM 50 MG PO TABS
100.0000 mg | ORAL_TABLET | Freq: Every day | ORAL | Status: DC
  Filled 2017-08-13: qty 2

## 2017-08-13 MED ORDER — POTASSIUM CHLORIDE CRYS ER 20 MEQ PO TBCR
40.0000 meq | EXTENDED_RELEASE_TABLET | Freq: Two times a day (BID) | ORAL | Status: AC
Start: 2017-08-13 — End: 2017-08-13
  Administered 2017-08-13 (×2): 40 meq via ORAL
  Filled 2017-08-13 (×3): qty 2

## 2017-08-13 NOTE — Progress Notes (Signed)
PROGRESS NOTE    Cindy Rhodes  HYI:[id number] DOB: 17-Feb-1946 DOA: 08/12/2017 PCP: Seward Carol, MD    Brief Narrative: Cindy Rhodes is a 71 y.o. female with history of diabetes mellitus type 2, hypertension presents to the ER because of patient feeling increasingly weak with poor appetite. In July 2 months ago patient was diagnosed with UTI and was on Macrobid for 1 week. Last week patient felt generally unwell and had gone to [place]. In [place] patient had foul-smelling urine and started developing fever and chills. Patient had gone to the ER and was prescribed Macrobid. After taking Macrobid patient's urine became more clear. But 2 days later patient started developing nausea and poor appetite. Had one or 2 episodes of diarrhea. Patient has been doing poorly and had gone to her PCP today and had labs done which showed hyponatremia and hypokalemia and was instructed to come to the ER.   ED Course: In the ER labs revealed hyponatremia and severe hypokalemia. UA is consistent with UTI. Patient was started on ceftriaxone for UTI and potassium replacement and IV fluids started for hyponatremia and hypokalemia. Admitted for further observation.  Review of Systems: As per HPI, rest all negative.  Assessment & Plan:   Active Problems:   Hypokalemia   Hyponatremia   Essential hypertension   Type 2 diabetes mellitus with hyperglycemia (HCC)   Acute lower UTI  1-UTI;  Recurrent; follow Urine culture.  UA with too numerous to count WBC.  Continue with ceftriaxone.  Follow WBC.   2-Hyponatremia;  Continue with IV fluids.  Recent episodes of vomiting.   3-Hypokalemia; replete orally and on IV fluids.  repeat B-met tonight.  Related to HCTZ and vomiting.   4-HTN; hold cozaar and HCTZ due to hyponatremia. Continue with metoprolol and norvasc.    5-DM uncontrolled. Hyperglycemia. SSI. Hold metformin. Check Hb A1c.       DVT prophylaxis:  Code Status: full code.  Family  Communication: husband who was at bedside.  Disposition Plan: home in 24 to 48 hours.    Consultants:   none   Procedures:   none   Antimicrobials: ceftriaxone    Subjective: She is feeling better, since she has been on antibiotics.  She report loose stool on and off.    Objective: Vitals:   08/13/17 0040 08/13/17 0436 08/13/17 0447 08/13/17 1441  BP: (!) 160/74 136/73  126/68  Pulse: 79 63  78  Resp: 16 16  16   Temp: 99.9 F (37.7 C) 98.6 F (37 C)  98.9 F (37.2 C)  TempSrc: Oral Oral  Oral  SpO2: 98% 98%  100%  Weight: 70.2 kg (154 lb 12.2 oz)  70.1 kg (154 lb 8.7 oz)   Height: 5' 1"  (1.549 m)       Intake/Output Summary (Last 24 hours) at 08/13/17 1530 Last data filed at 08/13/17 1500  Gross per 24 hour  Intake          1278.33 ml  Output                0 ml  Net          1278.33 ml   Filed Weights   08/13/17 0040 08/13/17 0447  Weight: 70.2 kg (154 lb 12.2 oz) 70.1 kg (154 lb 8.7 oz)    Examination:  General exam: Appears calm and comfortable  Respiratory system: Clear to auscultation. Respiratory effort normal. Cardiovascular system: S1 & S2 heard, RRR. No JVD, murmurs, rubs, gallops or  clicks. No pedal edema. Gastrointestinal system: Abdomen is nondistended, soft and nontender. No organomegaly or masses felt. Normal bowel sounds heard. Central nervous system: Alert and oriented. No focal neurological deficits. Extremities: Symmetric 5 x 5 power. Skin: No rashes, lesions or ulcers Psychiatry: Judgement and insight appear normal. Mood & affect appropriate.     Data Reviewed: I have personally reviewed following labs and imaging studies  CBC:  Recent Labs Lab 08/12/17 2129 08/13/17 0521  WBC 13.7* 13.2*  NEUTROABS 11.3*  --   HGB 12.9 11.6*  HCT 34.6* 31.7*  MCV 77.8* 79.8  PLT 294 570   Basic Metabolic Panel:  Recent Labs Lab 08/12/17 2129 08/13/17 0521  NA 125* 128*  K 2.2* 2.6*  CL 87* 95*  CO2 27 24  GLUCOSE 258* 281*    BUN 15 11  CREATININE 0.67 0.60  CALCIUM 8.7* 8.1*  MG  --  1.8   GFR: Estimated Creatinine Clearance: 58.6 mL/min (by C-G formula based on SCr of 0.6 mg/dL). Liver Function Tests: No results for input(s): AST, ALT, ALKPHOS, BILITOT, PROT, ALBUMIN in the last 168 hours. No results for input(s): LIPASE, AMYLASE in the last 168 hours. No results for input(s): AMMONIA in the last 168 hours. Coagulation Profile: No results for input(s): INR, PROTIME in the last 168 hours. Cardiac Enzymes: No results for input(s): CKTOTAL, CKMB, CKMBINDEX, TROPONINI in the last 168 hours. BNP (last 3 results) No results for input(s): PROBNP in the last 8760 hours. HbA1C: No results for input(s): HGBA1C in the last 72 hours. CBG:  Recent Labs Lab 08/13/17 0819 08/13/17 1205  GLUCAP 304* 209*   Lipid Profile: No results for input(s): CHOL, HDL, LDLCALC, TRIG, CHOLHDL, LDLDIRECT in the last 72 hours. Thyroid Function Tests:  Recent Labs  08/13/17 0521  TSH 1.132   Anemia Panel: No results for input(s): VITAMINB12, FOLATE, FERRITIN, TIBC, IRON, RETICCTPCT in the last 72 hours. Sepsis Labs:  Recent Labs Lab 08/12/17 2140  LATICACIDVEN 1.48    No results found for this or any previous visit (from the past 240 hour(s)).       Radiology Studies: No results found.      Scheduled Meds: . amLODipine  5 mg Oral Daily  . aspirin EC  81 mg Oral QHS  . calcium-vitamin D  1 tablet Oral BID  . enoxaparin (LOVENOX) injection  40 mg Subcutaneous QHS  . famotidine  20 mg Oral BID  . insulin aspart  0-9 Units Subcutaneous TID WC  . metoprolol tartrate  25 mg Oral BID  . potassium chloride  40 mEq Oral BID  . rosuvastatin  2.5 mg Oral QHS  . sertraline  75 mg Oral Daily   Continuous Infusions: . 0.9 % NaCl with KCl 20 mEq / L 100 mL/hr at 08/13/17 1432  . [START ON 08/14/2017] cefTRIAXone (ROCEPHIN)  IV       LOS: 0 days    Time spent: 35 minutes.     Elmarie Shiley,  MD [facility] Pager [phone]  If 7PM-7AM, please contact night-coverage [email or web address] Password [email or web address] 08/13/2017, 3:30 PM

## 2017-08-13 NOTE — Care Management Obs Status (Signed)
[place] NOTIFICATION   Patient Details  Name: Cindy Rhodes MRN: [id number] Date of Birth: Jun 30, 1946   Medicare Observation Status Notification Given:  Yes    Dessa Phi, RN 08/13/2017, 12:07 PM

## 2017-08-13 NOTE — Progress Notes (Signed)
Inpatient Diabetes Program Recommendations  AACE/ADA: New Consensus Statement on Inpatient Glycemic Control (2015)  Target Ranges:  Prepandial:   less than 140 mg/dL      Peak postprandial:   less than 180 mg/dL (1-2 hours)      Critically ill patients:  140 - 180 mg/dL   Lab Results  Component Value Date   GLUCAP 304 (H) 08/13/2017    Review of Glycemic Control  Inpatient Diabetes Program Recommendations:  Please consider A1c to determine prehospital glycemic control.  Thank you, Nani Gasser. Malek Skog, RN, MSN, CDE  Diabetes Coordinator Inpatient Glycemic Control Team Team Pager [phone] (8am-5pm) 08/13/2017 10:28 AM

## 2017-08-13 NOTE — ED Notes (Signed)
Admitting dr at bedside.  

## 2017-08-13 NOTE — Progress Notes (Signed)
CRITICAL VALUE ALERT  Critical Value:  Potassium 2.6  Date & Time Notied:  08/13/2017  0620  Provider Notified: [email or web address]  Orders Received/Actions taken: orders placed in epic

## 2017-08-13 NOTE — Care Management Note (Signed)
Case Management Note  Patient Details  Name: Cindy Rhodes MRN: [id number] Date of Birth: Mar 02, 1946  Subjective/Objective: 71 y/o f admitted w/hypokalemia. From home.                   Action/Plan:d/c plan home.   Expected Discharge Date:                  Expected Discharge Plan:  Home/Self Care  In-House Referral:     Discharge planning Services  CM Consult  Post Acute Care Choice:    Choice offered to:     DME Arranged:    DME Agency:     HH Arranged:    HH Agency:     Status of Service:  In process, will continue to follow  If discussed at Long Length of Stay Meetings, dates discussed:    Additional Comments:  Dessa Phi, RN 08/13/2017, 12:07 PM

## 2017-08-14 ENCOUNTER — Inpatient Hospital Stay (HOSPITAL_COMMUNITY): Payer: PPO

## 2017-08-14 LAB — GLUCOSE, CAPILLARY
GLUCOSE-CAPILLARY: 278 mg/dL — AB (ref 65–99)
GLUCOSE-CAPILLARY: 344 mg/dL — AB (ref 65–99)
GLUCOSE-CAPILLARY: 179 mg/dL — AB (ref 65–99)
Glucose-Capillary: 216 mg/dL — ABNORMAL HIGH (ref 65–99)
Glucose-Capillary: 357 mg/dL — ABNORMAL HIGH (ref 65–99)
Glucose-Capillary: 264 mg/dL — ABNORMAL HIGH (ref 65–99)

## 2017-08-14 LAB — URINALYSIS, ROUTINE W REFLEX MICROSCOPIC
Bilirubin Urine: NEGATIVE
Glucose, UA: 150 mg/dL — AB
Ketones, ur: NEGATIVE mg/dL
Nitrite: NEGATIVE
PROTEIN: NEGATIVE mg/dL
Specific Gravity, Urine: 1.006 (ref 1.005–1.030)
pH: 7 (ref 5.0–8.0)

## 2017-08-14 LAB — CBC
HCT: 32.7 % — ABNORMAL LOW (ref 36.0–46.0)
Hemoglobin: 11.6 g/dL — ABNORMAL LOW (ref 12.0–15.0)
MCH: 29.1 pg (ref 26.0–34.0)
MCHC: 35.5 g/dL (ref 30.0–36.0)
MCV: 82 fL (ref 78.0–100.0)
PLATELETS: 280 10*3/uL (ref 150–400)
RBC: 3.99 MIL/uL (ref 3.87–5.11)
RDW: 13.2 % (ref 11.5–15.5)
WBC: 13.1 10*3/uL — ABNORMAL HIGH (ref 4.0–10.5)

## 2017-08-14 LAB — BASIC METABOLIC PANEL
Anion gap: 6 (ref 5–15)
BUN: 8 mg/dL (ref 6–20)
CALCIUM: 8.3 mg/dL — AB (ref 8.9–10.3)
CO2: 24 mmol/L (ref 22–32)
CREATININE: 0.67 mg/dL (ref 0.44–1.00)
Chloride: 105 mmol/L (ref 101–111)
GFR calc Af Amer: >60 mL/min (ref >60)
GLUCOSE: 203 mg/dL — AB (ref 65–99)
Potassium: 4.2 mmol/L (ref 3.5–5.1)
SODIUM: 135 mmol/L (ref 135–145)

## 2017-08-14 LAB — URINE CULTURE

## 2017-08-14 MED ORDER — LIVING WELL WITH DIABETES BOOK
Freq: Once | Status: AC
  Administered 2017-08-14: 12:00:00
  Filled 2017-08-14: qty 1

## 2017-08-14 MED ORDER — PIPERACILLIN-TAZOBACTAM 3.375 G IVPB
3.3750 g | Freq: Three times a day (TID) | INTRAVENOUS | Status: DC
  Administered 2017-08-14 – 2017-08-15 (×3): 3.375 g via INTRAVENOUS
  Filled 2017-08-14 (×3): qty 50

## 2017-08-14 MED ORDER — INSULIN GLARGINE 100 UNIT/ML ~~LOC~~ SOLN
10.0000 [IU] | Freq: Every day | SUBCUTANEOUS | Status: DC
  Filled 2017-08-14: qty 0.1

## 2017-08-14 MED ORDER — SODIUM CHLORIDE 0.9 % IV SOLN
INTRAVENOUS | Status: DC
  Administered 2017-08-14: 15:00:00 via INTRAVENOUS

## 2017-08-14 NOTE — Progress Notes (Signed)
Inpatient Diabetes Program Recommendations  AACE/ADA: New Consensus Statement on Inpatient Glycemic Control (2015)  Target Ranges:  Prepandial:   less than 140 mg/dL      Peak postprandial:   less than 180 mg/dL (1-2 hours)      Critically ill patients:  140 - 180 mg/dL   Lab Results  Component Value Date   GLUCAP 216 (H) 08/14/2017   HGBA1C 7.7 (H) 08/13/2017    Review of Glycemic Control  Diabetes history: DM2 Outpatient Diabetes medications: Metformin 500 mg bid Current orders for Inpatient glycemic control: Novolog sensitive scale tid  Inpatient Diabetes Program Recommendations:  Received consult regarding DM education. Noted A1c 7.7. Ordered Living Well With Diabetes and patient education videos.  Thank you, Nani Gasser. Terresa Marlett, RN, MSN, CDE  Diabetes Coordinator Inpatient Glycemic Control Team Team Pager [phone] (8am-5pm) 08/14/2017 11:47 AM

## 2017-08-14 NOTE — Progress Notes (Addendum)
Pharmacy Antibiotic Note  Cindy Rhodes is a 71 y.o. female admitted on 08/12/2017 with recurrent UTI. UCx at outside provider shows pansensitive E coli and treated with Macrobid as outpatient and Rocephin in hospital. Patient continues to have elevated WBC however, and UA with TNTC wbc despite Rocephin x 3 days. Pharmacy has been consulted for Zosyn dosing.  Plan:  Zosyn 3.375 g IV given once over 30 minutes, then every 8 hrs by 4-hr infusion  SCr stable; Pharmacy will follow peripherally for renal adjustments and culture results   Height: 5\' 1"  (154.9 cm) Weight: 156 lb 12 oz (71.1 kg) IBW/kg (Calculated) : 47.8  Temp (24hrs), Avg:98.3 F (36.8 C), Min:97.7 F (36.5 C), Max:99 F (37.2 C)   Recent Labs Lab 08/12/17 2129 08/12/17 2140 08/13/17 0521 08/13/17 1708 08/14/17 0552  WBC 13.7*  --  13.2*  --  13.1*  CREATININE 0.67  --  0.60 0.55 0.67  LATICACIDVEN  --  1.48  --   --   --     Estimated Creatinine Clearance: 59 mL/min (by C-G formula based on SCr of 0.67 mg/dL).    Allergies  Allergen Reactions  . Meloxicam Other (See Comments)    Dizzy and nervous   . Sulfa Antibiotics Other (See Comments)    Shuts down urinary system    Antimicrobials this admission: 9/13 Rocephin >> 9/15 9/15 Zosyn >>   Microbiology results: 9/13 UCx: mx spp, suggest recollect 9/15 UCx: sent    Thank you for allowing pharmacy to be a part of this patient's care.  Reuel Boom, PharmD, BCPS Pager: [phone] 08/14/2017, 5:13 PM

## 2017-08-14 NOTE — Progress Notes (Signed)
PROGRESS NOTE    SEATTLE DALPORTO  HYQ:[id number] DOB: 1946-02-26 DOA: 08/12/2017 PCP: Seward Carol, MD    Brief Narrative: Cindy Rhodes is a 71 y.o. female with history of diabetes mellitus type 2, hypertension presents to the ER because of patient feeling increasingly weak with poor appetite. In July 2 months ago patient was diagnosed with UTI and was on Macrobid for 1 week. Last week patient felt generally unwell and had gone to [place]. In [place] patient had foul-smelling urine and started developing fever and chills. Patient had gone to the ER and was prescribed Macrobid. After taking Macrobid patient's urine became more clear. But 2 days later patient started developing nausea and poor appetite. Had one or 2 episodes of diarrhea. Patient has been doing poorly and had gone to her PCP today and had labs done which showed hyponatremia and hypokalemia and was instructed to come to the ER.   ED Course: In the ER labs revealed hyponatremia and severe hypokalemia. UA is consistent with UTI. Patient was started on ceftriaxone for UTI and potassium replacement and IV fluids started for hyponatremia and hypokalemia. Admitted for further observation.  Review of Systems: As per HPI, rest all negative.  Assessment & Plan:   Active Problems:   Hypokalemia   Hyponatremia   Essential hypertension   Type 2 diabetes mellitus with hyperglycemia (HCC)   Acute lower UTI  1-UTI;  Recurrent; follow Urine culture. With multiples bacterial morphotype.  UA with too numerous to count WBC.  Change ceftriaxone to zosyn, still with leukocytosis and UA with too numerous to count WBC>  WBC still elevated, renal US negative.   2-Hyponatremia;  Continue with IV fluids.  Recent episodes of vomiting.  Resolved.   3-Hypokalemia; replete orally and on IV fluids.  Resolved.  Related to HCTZ and vomiting.   4-HTN; hold cozaar and HCTZ due to hyponatremia. Continue with metoprolol and norvasc.    5-DM  uncontrolled. Hyperglycemia. SSI. Hold metformin.  Hb A1c. 7.7 Patient does not wants long acting insulin. Continue with SSI>  She has allergies to sulfa, would avoid glipizide and glimepiride.       DVT prophylaxis:  Code Status: full code.  Family Communication: husband who was at bedside.  Disposition Plan: home in 24 to 48 hours.    Consultants:   none   Procedures:   none   Antimicrobials: ceftriaxone    Subjective: She is feeling better, more energy.  She has been eating more.    Objective: Vitals:   08/13/17 1441 08/13/17 2113 08/14/17 0527 08/14/17 1625  BP: 126/68 134/70 (!) 158/69 135/74  Pulse: 78 85 69 68  Resp: 16 16 16 16   Temp: 98.9 F (37.2 C) 97.7 F (36.5 C) 99 F (37.2 C) 98.2 F (36.8 C)  TempSrc: Oral Oral Oral Oral  SpO2: 100% 100% 98% 98%  Weight:   71.1 kg (156 lb 12 oz)   Height:        Intake/Output Summary (Last 24 hours) at 08/14/17 1647 Last data filed at 08/14/17 0644  Gross per 24 hour  Intake              360 ml  Output                0 ml  Net              360 ml   Filed Weights   08/13/17 0040 08/13/17 0447 08/14/17 0527  Weight: 70.2 kg (154 lb  12.2 oz) 70.1 kg (154 lb 8.7 oz) 71.1 kg (156 lb 12 oz)    Examination:  General exam: NAD Respiratory system: CTA Cardiovascular system: S 1, S 2 RRR Gastrointestinal system: BS present, soft, nt Central nervous system: non focal.  Extremities: Symmetric 5 x 5 power. Skin: No rashes, lesions or ulcers   Data Reviewed: I have personally reviewed following labs and imaging studies  CBC:  Recent Labs Lab 08/12/17 2129 08/13/17 0521 08/14/17 0552  WBC 13.7* 13.2* 13.1*  NEUTROABS 11.3*  --   --   HGB 12.9 11.6* 11.6*  HCT 34.6* 31.7* 32.7*  MCV 77.8* 79.8 82.0  PLT 294 275 010   Basic Metabolic Panel:  Recent Labs Lab 08/12/17 2129 08/13/17 0521 08/13/17 1708 08/14/17 0552  NA 125* 128* 131* 135  K 2.2* 2.6* 3.8 4.2  CL 87* 95* 101 105  CO2 27 24  23 24   GLUCOSE 258* 281* 192* 203*  BUN 15 11 10 8   CREATININE 0.67 0.60 0.55 0.67  CALCIUM 8.7* 8.1* 7.8* 8.3*  MG  --  1.8  --   --    GFR: Estimated Creatinine Clearance: 59 mL/min (by C-G formula based on SCr of 0.67 mg/dL). Liver Function Tests: No results for input(s): AST, ALT, ALKPHOS, BILITOT, PROT, ALBUMIN in the last 168 hours. No results for input(s): LIPASE, AMYLASE in the last 168 hours. No results for input(s): AMMONIA in the last 168 hours. Coagulation Profile: No results for input(s): INR, PROTIME in the last 168 hours. Cardiac Enzymes: No results for input(s): CKTOTAL, CKMB, CKMBINDEX, TROPONINI in the last 168 hours. BNP (last 3 results) No results for input(s): PROBNP in the last 8760 hours. HbA1C:  Recent Labs  08/13/17 1211  HGBA1C 7.7*   CBG:  Recent Labs Lab 08/13/17 1746 08/13/17 2110 08/14/17 0738 08/14/17 1149 08/14/17 1152  GLUCAP 170* 278* 216* 357* 344*   Lipid Profile: No results for input(s): CHOL, HDL, LDLCALC, TRIG, CHOLHDL, LDLDIRECT in the last 72 hours. Thyroid Function Tests:  Recent Labs  08/13/17 0521  TSH 1.132   Anemia Panel: No results for input(s): VITAMINB12, FOLATE, FERRITIN, TIBC, IRON, RETICCTPCT in the last 72 hours. Sepsis Labs:  Recent Labs Lab 08/12/17 2140  LATICACIDVEN 1.48    Recent Results (from the past 240 hour(s))  Urine Culture     Status: Abnormal   Collection Time: 08/12/17  8:34 PM  Result Value Ref Range Status   Specimen Description URINE, RANDOM  Final   Special Requests NONE  Final   Culture MULTIPLE SPECIES PRESENT, SUGGEST RECOLLECTION (A)  Final   Report Status 08/14/2017 FINAL  Final         Radiology Studies: US Renal  Result Date: 08/14/2017 CLINICAL DATA:  Urinary tract infection. EXAM: RENAL / URINARY TRACT ULTRASOUND COMPLETE COMPARISON:  None. FINDINGS: Right Kidney: Length: 11.6 cm. Echogenicity within normal limits. No mass or hydronephrosis visualized. Left Kidney:  Length: 11.9 cm. Echogenicity within normal limits. No mass or hydronephrosis visualized. Bladder: Appears normal for degree of bladder distention. IMPRESSION: Normal renal ultrasound. Electronically Signed   By: Titus Dubin M.D.   On: 08/14/2017 11:46        Scheduled Meds: . amLODipine  5 mg Oral Daily  . aspirin EC  81 mg Oral QHS  . calcium-vitamin D  1 tablet Oral BID  . enoxaparin (LOVENOX) injection  40 mg Subcutaneous QHS  . famotidine  20 mg Oral BID  . insulin aspart  0-9 Units  Subcutaneous TID WC  . metoprolol tartrate  25 mg Oral BID  . rosuvastatin  2.5 mg Oral QHS  . sertraline  75 mg Oral Daily   Continuous Infusions: . sodium chloride 75 mL/hr at 08/14/17 1437  . cefTRIAXone (ROCEPHIN)  IV Stopped (08/14/17 1328)     LOS: 1 day    Time spent: 35 minutes.     Elmarie Shiley, MD [facility] Pager [phone]  If 7PM-7AM, please contact night-coverage [email or web address] Password [facility] 08/14/2017, 4:47 PM

## 2017-08-15 LAB — CBC WITH DIFFERENTIAL/PLATELET
BASOS ABS: 0.1 10*3/uL (ref 0.0–0.1)
BASOS PCT: 1 %
EOS PCT: 3 %
Eosinophils Absolute: 0.4 10*3/uL (ref 0.0–0.7)
HEMATOCRIT: 33.4 % — AB (ref 36.0–46.0)
Hemoglobin: 11.8 g/dL — ABNORMAL LOW (ref 12.0–15.0)
LYMPHS ABS: 1.8 10*3/uL (ref 0.7–4.0)
Lymphocytes Relative: 15 %
MCH: 28.4 pg (ref 26.0–34.0)
MCHC: 35.3 g/dL (ref 30.0–36.0)
MCV: 80.3 fL (ref 78.0–100.0)
MONO ABS: 1.2 10*3/uL — AB (ref 0.1–1.0)
Monocytes Relative: 10 %
NEUTROS ABS: 8.3 10*3/uL — AB (ref 1.7–7.7)
Neutrophils Relative %: 71 %
PLATELETS: 305 10*3/uL (ref 150–400)
RBC: 4.16 MIL/uL (ref 3.87–5.11)
RDW: 13 % (ref 11.5–15.5)
WBC: 11.8 10*3/uL — AB (ref 4.0–10.5)

## 2017-08-15 LAB — BASIC METABOLIC PANEL
Anion gap: 10 (ref 5–15)
BUN: 9 mg/dL (ref 6–20)
CALCIUM: 8.3 mg/dL — AB (ref 8.9–10.3)
CO2: 21 mmol/L — AB (ref 22–32)
CREATININE: 0.59 mg/dL (ref 0.44–1.00)
Chloride: 103 mmol/L (ref 101–111)
GFR calc non Af Amer: >60 mL/min (ref >60)
GLUCOSE: 186 mg/dL — AB (ref 65–99)
Potassium: 3.4 mmol/L — ABNORMAL LOW (ref 3.5–5.1)
Sodium: 134 mmol/L — ABNORMAL LOW (ref 135–145)

## 2017-08-15 LAB — GLUCOSE, CAPILLARY
GLUCOSE-CAPILLARY: 333 mg/dL — AB (ref 65–99)
Glucose-Capillary: 196 mg/dL — ABNORMAL HIGH (ref 65–99)

## 2017-08-15 MED ORDER — PIOGLITAZONE HCL 15 MG PO TABS: 15.0000 mg | ORAL_TABLET | Freq: Every day | ORAL | 0 refills | Status: DC

## 2017-08-15 MED ORDER — CIPROFLOXACIN HCL 500 MG PO TABS: 500.0000 mg | ORAL_TABLET | Freq: Two times a day (BID) | ORAL | 0 refills | Status: DC

## 2017-08-15 MED ORDER — SODIUM CHLORIDE 0.9 % IV BOLUS (SEPSIS)
500.0000 mL | Freq: Once | INTRAVENOUS | Status: AC
  Administered 2017-08-15: 500 mL via INTRAVENOUS

## 2017-08-15 MED ORDER — POTASSIUM CHLORIDE CRYS ER 20 MEQ PO TBCR: 40.0000 meq | EXTENDED_RELEASE_TABLET | Freq: Every day | ORAL | 0 refills | Status: DC

## 2017-08-15 MED ORDER — POTASSIUM CHLORIDE CRYS ER 20 MEQ PO TBCR
40.0000 meq | EXTENDED_RELEASE_TABLET | Freq: Two times a day (BID) | ORAL | Status: DC
  Administered 2017-08-15: 40 meq via ORAL
  Filled 2017-08-15: qty 2

## 2017-08-15 MED ORDER — PIOGLITAZONE HCL 15 MG PO TABS: 15.0000 mg | ORAL_TABLET | Freq: Every day | ORAL | Status: DC

## 2017-08-15 MED ORDER — METFORMIN HCL 1000 MG PO TABS
1000.0000 mg | ORAL_TABLET | Freq: Every day | ORAL | 11 refills | Status: DC
Start: 2017-08-15 — End: 2019-08-18

## 2017-08-15 MED ORDER — CIPROFLOXACIN HCL 500 MG PO TABS
500.0000 mg | ORAL_TABLET | Freq: Two times a day (BID) | ORAL | Status: DC
  Administered 2017-08-15: 500 mg via ORAL
  Filled 2017-08-15: qty 1

## 2017-08-15 NOTE — Discharge Summary (Signed)
Physician Discharge Summary  Cindy Rhodes IRS:[id number] DOB: 04/13/1946 DOA: 08/12/2017  PCP: Seward Carol, MD  Admit date: 08/12/2017 Discharge date: 08/15/2017  Admitted From: Home  Disposition: Home   Recommendations for Outpatient Follow-up:  1. Follow up with PCP in 2-3 days.  2. Please follow repeated  urine culture results.  3. Please consider referral to urology for further evaluation of recurrent UTI.    Discharge Condition: Stable.  CODE STATUS: Full code.  Diet recommendation: Heart Healthy   Brief/Interim Summary: Cindy Rhodes a 71 y.o.femalewith history of diabetes mellitus type 2, hypertension presents to the ER because of patient feeling increasingly weak with poor appetite. In July 2 months ago patient was diagnosed with UTI and was on Macrobid for 1 week. Last week patient felt generally unwell and had gone to [place]. In [place] patient had foul-smelling urine and started developing fever and chills. Patient had gone to the ER and was prescribed Macrobid. After taking Macrobid patient's urine became more clear. But 2 days later patient started developing nausea and poor appetite. Had one or 2 episodes of diarrhea. Patient has been doing poorly and had gone to her PCP today and had labs done which showed hyponatremia and hypokalemia and was instructed to come to the ER.  ED Course:In the ER labs revealed hyponatremia and severe hypokalemia. UA is consistent with UTI. Patient was started on ceftriaxone for UTI and potassium replacement and IV fluids started for hyponatremia and hypokalemia. Admitted for further observation.  Review of Systems: As per HPI, rest all negative.  Assessment & Plan:   Active Problems:   Hypokalemia   Hyponatremia   Essential hypertension   Type 2 diabetes mellitus with hyperglycemia (HCC)   Acute lower UTI  1-UTI;  Recurrent; follow Urine culture. With multiples bacterial morphotype.  UA with too numerous to count  WBC.  Change ceftriaxone to zosyn, still with leukocytosis and UA with too numerous to count WBC> urine culture pending. WBC has decreased to 11. Discussed with ID, patient will be discharge on ciprofloxacin for 5 days. Please follow urine culture.  Patient will benefit from urology evaluation.  Renal US negative.  Suspect Macrobid didn't work because sensitivity was 1: 16  2-Hyponatremia;  Continue with IV fluids.  Recent episodes of vomiting.  Improved.   3-Hypokalemia; replete orally and on IV fluids.  Mildly decreased at 3.4. Will provide 40 meq daily  Related to HCTZ and vomiting.   4-HTN; hold cozaar and HCTZ due to hyponatremia. Continue with metoprolol and norvasc.    5-DM uncontrolled. Hyperglycemia. SSI. Hold metformin.  Hb A1c. 7.7 Patient does not wants long acting insulin. Continue with SSI>  She has allergies to sulfa, would avoid glipizide and glimepiride.  Her blood sugar has been elevated. She does not wants to use insulin at discharge. Will give prescription for Actos, this should be for short term.    Discharge Diagnoses:  Active Problems:   Hypokalemia   Hyponatremia   Essential hypertension   Type 2 diabetes mellitus with hyperglycemia (HCC)   Acute lower UTI    Discharge Instructions  Discharge Instructions    Diet - low sodium heart healthy    Complete by:  As directed    Increase activity slowly    Complete by:  As directed      Allergies as of 08/15/2017      Reactions   Meloxicam Other (See Comments)   Dizzy and nervous    Sulfa Antibiotics Other (See Comments)  Shuts down urinary system      Medication List    STOP taking these medications   losartan-hydrochlorothiazide 100-25 MG tablet Commonly known as:  HYZAAR     TAKE these medications   amLODipine 5 MG tablet Commonly known as:  NORVASC Take 5 mg by mouth daily.   aspirin EC 81 MG tablet Take 81 mg by mouth at bedtime.   CALTRATE 600+D 600-400 MG-UNIT  tablet Generic drug:  Calcium Carbonate-Vitamin D Take 1 tablet by mouth 2 (two) times daily.   ciprofloxacin 500 MG tablet Commonly known as:  CIPRO Take 1 tablet (500 mg total) by mouth 2 (two) times daily.   CoQ-10 50 MG Caps Take 50 mg by mouth daily.   Cranberry 500 MG Caps Take 500 mg by mouth at bedtime.   CRESTOR 5 MG tablet Generic drug:  rosuvastatin Take 2.5 mg by mouth at bedtime.   glucosamine-chondroitin 500-400 MG tablet Take 1 tablet by mouth 3 (three) times daily.   ibuprofen 200 MG tablet Commonly known as:  ADVIL,MOTRIN Take 600 mg by mouth daily as needed (pain).   liver oil-zinc oxide 40 % ointment Commonly known as:  DESITIN Apply 1 application topically at bedtime as needed (yeast). Apply to each side of mouth at night   MELATIN PO Take 5 mg by mouth daily at 2 PM.   metFORMIN 1000 MG tablet Commonly known as:  GLUCOPHAGE Take 1 tablet (1,000 mg total) by mouth daily with breakfast. What changed:  medication strength  how much to take  when to take this   metoprolol tartrate 25 MG tablet Commonly known as:  LOPRESSOR Take 25 mg by mouth 2 (two) times daily.   multivitamin with minerals Tabs tablet Take 1 tablet by mouth daily.   pioglitazone 15 MG tablet Commonly known as:  ACTOS Take 1 tablet (15 mg total) by mouth daily.   potassium chloride SA 20 MEQ tablet Commonly known as:  K-DUR,KLOR-CON Take 2 tablets (40 mEq total) by mouth daily.   ranitidine 75 MG tablet Commonly known as:  ZANTAC Take 75 mg by mouth daily. Take with ibuprofen   sertraline 50 MG tablet Commonly known as:  ZOLOFT Take 75 mg by mouth daily.   TART CHERRY ADVANCED Caps Take by mouth daily at 2 PM.   vitamin C 500 MG tablet Commonly known as:  ASCORBIC ACID Take 500 mg by mouth daily.            Discharge Care Instructions        Start     Ordered   08/16/17 0000  pioglitazone (ACTOS) 15 MG tablet  Daily     08/15/17 1323   08/15/17  0000  metFORMIN (GLUCOPHAGE) 1000 MG tablet  Daily with breakfast     08/15/17 1318   08/15/17 0000  Increase activity slowly     08/15/17 1318   08/15/17 0000  Diet - low sodium heart healthy     08/15/17 1318   08/15/17 0000  ciprofloxacin (CIPRO) 500 MG tablet  2 times daily     08/15/17 1323   08/15/17 0000  potassium chloride SA (K-DUR,KLOR-CON) 20 MEQ tablet  Daily     08/15/17 1323      Allergies  Allergen Reactions  . Meloxicam Other (See Comments)    Dizzy and nervous   . Sulfa Antibiotics Other (See Comments)    Shuts down urinary system    Consultations:  none   Procedures/Studies: US Renal  Result Date: 08/14/2017 CLINICAL DATA:  Urinary tract infection. EXAM: RENAL / URINARY TRACT ULTRASOUND COMPLETE COMPARISON:  None. FINDINGS: Right Kidney: Length: 11.6 cm. Echogenicity within normal limits. No mass or hydronephrosis visualized. Left Kidney: Length: 11.9 cm. Echogenicity within normal limits. No mass or hydronephrosis visualized. Bladder: Appears normal for degree of bladder distention. IMPRESSION: Normal renal ultrasound. Electronically Signed   By: Titus Dubin M.D.   On: 08/14/2017 11:46      Subjective: Patient feeling better. She is ready to go home. She wants to go home.   Discharge Exam: Vitals:   08/14/17 2135 08/15/17 0553  BP: 135/80 (!) 170/77  Pulse: 80 62  Resp: 18 18  Temp: 98.9 F (37.2 C) 99.1 F (37.3 C)  SpO2: 100% 98%   Vitals:   08/14/17 0527 08/14/17 1625 08/14/17 2135 08/15/17 0553  BP: (!) 158/69 135/74 135/80 (!) 170/77  Pulse: 69 68 80 62  Resp: 16 16 18 18   Temp: 99 F (37.2 C) 98.2 F (36.8 C) 98.9 F (37.2 C) 99.1 F (37.3 C)  TempSrc: Oral Oral Oral Oral  SpO2: 98% 98% 100% 98%  Weight: 71.1 kg (156 lb 12 oz)   71.3 kg (157 lb 3 oz)  Height:        General: Pt is alert, awake, not in acute distress Cardiovascular: RRR, S1/S2 +, no rubs, no gallops Respiratory: CTA bilaterally, no wheezing, no  rhonchi Abdominal: Soft, NT, ND, bowel sounds + Extremities: no edema, no cyanosis    The results of significant diagnostics from this hospitalization (including imaging, microbiology, ancillary and laboratory) are listed below for reference.     Microbiology: Recent Results (from the past 240 hour(s))  Urine Culture     Status: Abnormal   Collection Time: 08/12/17  8:34 PM  Result Value Ref Range Status   Specimen Description URINE, RANDOM  Final   Special Requests NONE  Final   Culture MULTIPLE SPECIES PRESENT, SUGGEST RECOLLECTION (A)  Final   Report Status 08/14/2017 FINAL  Final     Labs: BNP (last 3 results) No results for input(s): BNP in the last 8760 hours. Basic Metabolic Panel:  Recent Labs Lab 08/12/17 2129 08/13/17 0521 08/13/17 1708 08/14/17 0552 08/15/17 0516  NA 125* 128* 131* 135 134*  K 2.2* 2.6* 3.8 4.2 3.4*  CL 87* 95* 101 105 103  CO2 27 24 23 24  21*  GLUCOSE 258* 281* 192* 203* 186*  BUN 15 11 10 8 9   CREATININE 0.67 0.60 0.55 0.67 0.59  CALCIUM 8.7* 8.1* 7.8* 8.3* 8.3*  MG  --  1.8  --   --   --    Liver Function Tests: No results for input(s): AST, ALT, ALKPHOS, BILITOT, PROT, ALBUMIN in the last 168 hours. No results for input(s): LIPASE, AMYLASE in the last 168 hours. No results for input(s): AMMONIA in the last 168 hours. CBC:  Recent Labs Lab 08/12/17 2129 08/13/17 0521 08/14/17 0552 08/15/17 0516  WBC 13.7* 13.2* 13.1* 11.8*  NEUTROABS 11.3*  --   --  8.3*  HGB 12.9 11.6* 11.6* 11.8*  HCT 34.6* 31.7* 32.7* 33.4*  MCV 77.8* 79.8 82.0 80.3  PLT 294 275 280 305   Cardiac Enzymes: No results for input(s): CKTOTAL, CKMB, CKMBINDEX, TROPONINI in the last 168 hours. BNP: Invalid input(s): POCBNP CBG:  Recent Labs Lab 08/14/17 1152 08/14/17 1628 08/14/17 2131 08/15/17 0748 08/15/17 1139  GLUCAP 344* 179* 264* 196* 333*   D-Dimer No results for input(s): DDIMER  in the last 72 hours. Hgb A1c  Recent Labs   08/13/17 1211  HGBA1C 7.7*   Lipid Profile No results for input(s): CHOL, HDL, LDLCALC, TRIG, CHOLHDL, LDLDIRECT in the last 72 hours. Thyroid function studies  Recent Labs  08/13/17 0521  TSH 1.132   Anemia work up No results for input(s): VITAMINB12, FOLATE, FERRITIN, TIBC, IRON, RETICCTPCT in the last 72 hours. Urinalysis    Component Value Date/Time   COLORURINE YELLOW 08/14/2017 1302   APPEARANCEUR CLEAR 08/14/2017 1302   LABSPEC 1.006 08/14/2017 1302   PHURINE 7.0 08/14/2017 1302   GLUCOSEU 150 (A) 08/14/2017 1302   HGBUR MODERATE (A) 08/14/2017 1302   BILIRUBINUR NEGATIVE 08/14/2017 1302   KETONESUR NEGATIVE 08/14/2017 1302   PROTEINUR NEGATIVE 08/14/2017 1302   UROBILINOGEN 0.2 04/02/2014 1222   NITRITE NEGATIVE 08/14/2017 1302   LEUKOCYTESUR LARGE (A) 08/14/2017 1302   Sepsis Labs Invalid input(s): PROCALCITONIN,  WBC,  LACTICIDVEN Microbiology Recent Results (from the past 240 hour(s))  Urine Culture     Status: Abnormal   Collection Time: 08/12/17  8:34 PM  Result Value Ref Range Status   Specimen Description URINE, RANDOM  Final   Special Requests NONE  Final   Culture MULTIPLE SPECIES PRESENT, SUGGEST RECOLLECTION (A)  Final   Report Status 08/14/2017 FINAL  Final     Time coordinating discharge: Over 30 minutes  SIGNED:   Elmarie Shiley, MD  [facility] 08/15/2017, 1:29 PM Pager   If 7PM-7AM, please contact night-coverage [email or web address] Password [email or web address]

## 2017-08-16 LAB — URINE CULTURE: Culture: NO GROWTH

## 2017-08-17 DIAGNOSIS — E871 Hypo-osmolality and hyponatremia: Secondary | ICD-10-CM | POA: Diagnosis not present

## 2017-08-17 DIAGNOSIS — R197 Diarrhea, unspecified: Secondary | ICD-10-CM | POA: Diagnosis not present

## 2017-08-17 DIAGNOSIS — D72829 Elevated white blood cell count, unspecified: Secondary | ICD-10-CM | POA: Diagnosis not present

## 2017-08-17 DIAGNOSIS — N390 Urinary tract infection, site not specified: Secondary | ICD-10-CM | POA: Diagnosis not present

## 2017-08-17 DIAGNOSIS — E876 Hypokalemia: Secondary | ICD-10-CM | POA: Diagnosis not present

## 2017-08-31 DIAGNOSIS — N390 Urinary tract infection, site not specified: Secondary | ICD-10-CM | POA: Diagnosis not present

## 2017-08-31 DIAGNOSIS — R112 Nausea with vomiting, unspecified: Secondary | ICD-10-CM | POA: Diagnosis not present

## 2017-09-07 ENCOUNTER — Ambulatory Visit: Payer: PPO | Admitting: Podiatry

## 2017-09-29 ENCOUNTER — Ambulatory Visit (INDEPENDENT_AMBULATORY_CARE_PROVIDER_SITE_OTHER): Payer: PPO | Admitting: Podiatry

## 2017-09-29 ENCOUNTER — Encounter: Payer: Self-pay | Admitting: Podiatry

## 2017-09-29 DIAGNOSIS — B351 Tinea unguium: Secondary | ICD-10-CM | POA: Diagnosis not present

## 2017-09-29 DIAGNOSIS — E119 Type 2 diabetes mellitus without complications: Secondary | ICD-10-CM

## 2017-09-29 DIAGNOSIS — M79676 Pain in unspecified toe(s): Secondary | ICD-10-CM | POA: Diagnosis not present

## 2017-09-29 NOTE — Progress Notes (Signed)
Patient ID: Cindy Rhodes, female   DOB: 02-27-1946, 71 y.o.   MRN: [id number] Complaint:  Visit Type: Patient returns to my office for continued preventative foot care services. Complaint: Patient states" my nails have grown long and thick and become painful to walk and wear shoes" Patient has been diagnosed with DM with no complications. He presents for preventative foot care services. No changes to ROS  Podiatric Exam: Vascular: dorsalis pedis and posterior tibial pulses are palpable bilateral. Capillary return is immediate. Temperature gradient is WNL. Skin turgor WNL  Sensorium: Normal Semmes Weinstein monofilament test. Normal tactile sensation bilaterally. Nail Exam: Pt has thick disfigured discolored nails with subungual debris noted bilateral entire nail hallux through fifth toenails Ulcer Exam: There is no evidence of ulcer or pre-ulcerative changes or infection. Orthopedic Exam: Muscle tone and strength are WNL. No limitations in general ROM. No crepitus or effusions noted. Foot type and digits show no abnormalities. Bony prominences are unremarkable. Skin: No Porokeratosis. No infection or ulcers  Diagnosis:  Tinea unguium, Pain in right toe, pain in left toes  Treatment & Plan Procedures and Treatment: Consent by patient was obtained for treatment procedures. The patient understood the discussion of treatment and procedures well. All questions were answered thoroughly reviewed. Debridement of mycotic and hypertrophic toenails, 1 through 5 bilateral and clearing of subungual debris. No ulceration, no infection noted.  Return Visit-Office Procedure: Patient instructed to return to the office for a follow up visit 10 weeks  for continued evaluation and treatment.  Gardiner Barefoot DPM

## 2017-09-30 DIAGNOSIS — R350 Frequency of micturition: Secondary | ICD-10-CM | POA: Diagnosis not present

## 2017-10-27 DIAGNOSIS — N3020 Other chronic cystitis without hematuria: Secondary | ICD-10-CM | POA: Diagnosis not present

## 2017-11-02 DIAGNOSIS — E7800 Pure hypercholesterolemia, unspecified: Secondary | ICD-10-CM | POA: Diagnosis not present

## 2017-11-02 DIAGNOSIS — Z7984 Long term (current) use of oral hypoglycemic drugs: Secondary | ICD-10-CM | POA: Diagnosis not present

## 2017-11-02 DIAGNOSIS — M8588 Other specified disorders of bone density and structure, other site: Secondary | ICD-10-CM | POA: Diagnosis not present

## 2017-11-02 DIAGNOSIS — I10 Essential (primary) hypertension: Secondary | ICD-10-CM | POA: Diagnosis not present

## 2017-11-02 DIAGNOSIS — E119 Type 2 diabetes mellitus without complications: Secondary | ICD-10-CM | POA: Diagnosis not present

## 2017-11-02 DIAGNOSIS — F325 Major depressive disorder, single episode, in full remission: Secondary | ICD-10-CM | POA: Diagnosis not present

## 2017-12-06 DIAGNOSIS — D1801 Hemangioma of skin and subcutaneous tissue: Secondary | ICD-10-CM | POA: Diagnosis not present

## 2017-12-06 DIAGNOSIS — D229 Melanocytic nevi, unspecified: Secondary | ICD-10-CM | POA: Diagnosis not present

## 2017-12-06 DIAGNOSIS — L821 Other seborrheic keratosis: Secondary | ICD-10-CM | POA: Diagnosis not present

## 2017-12-06 DIAGNOSIS — L814 Other melanin hyperpigmentation: Secondary | ICD-10-CM | POA: Diagnosis not present

## 2017-12-21 ENCOUNTER — Ambulatory Visit: Payer: PPO | Admitting: Podiatry

## 2017-12-22 DIAGNOSIS — N3941 Urge incontinence: Secondary | ICD-10-CM | POA: Diagnosis not present

## 2018-01-04 ENCOUNTER — Ambulatory Visit: Payer: PPO | Admitting: Podiatry

## 2018-01-04 ENCOUNTER — Encounter: Payer: Self-pay | Admitting: Podiatry

## 2018-01-04 DIAGNOSIS — M79676 Pain in unspecified toe(s): Secondary | ICD-10-CM

## 2018-01-04 DIAGNOSIS — B351 Tinea unguium: Secondary | ICD-10-CM | POA: Diagnosis not present

## 2018-01-04 DIAGNOSIS — E119 Type 2 diabetes mellitus without complications: Secondary | ICD-10-CM | POA: Diagnosis not present

## 2018-01-04 NOTE — Progress Notes (Signed)
Patient ID: Cindy Rhodes, female   DOB: January 24, 1946, 72 y.o.   MRN: [id number] Complaint:  Visit Type: Patient returns to my office for continued preventative foot care services. Complaint: Patient states" my nails have grown long and thick and become painful to walk and wear shoes" Patient has been diagnosed with DM with no complications. He presents for preventative foot care services. No changes to ROS  Podiatric Exam: Vascular: dorsalis pedis and posterior tibial pulses are palpable bilateral. Capillary return is immediate. Temperature gradient is WNL. Skin turgor WNL  Sensorium: Normal Semmes Weinstein monofilament test. Normal tactile sensation bilaterally. Nail Exam: Pt has thick disfigured discolored nails with subungual debris noted bilateral entire nail hallux through fifth toenails Ulcer Exam: There is no evidence of ulcer or pre-ulcerative changes or infection. Orthopedic Exam: Muscle tone and strength are WNL. No limitations in general ROM. No crepitus or effusions noted. Foot type and digits show no abnormalities. Bony prominences are unremarkable. Skin: No Porokeratosis. No infection or ulcers  Diagnosis:  Tinea unguium, Pain in right toe, pain in left toes  Treatment & Plan Procedures and Treatment: Consent by patient was obtained for treatment procedures. The patient understood the discussion of treatment and procedures well. All questions were answered thoroughly reviewed. Debridement of mycotic and hypertrophic toenails, 1 through 5 bilateral and clearing of subungual debris. No ulceration, no infection noted.  Return Visit-Office Procedure: Patient instructed to return to the office for a follow up visit 10 weeks  for continued evaluation and treatment.  Gardiner Barefoot DPM

## 2018-03-07 DIAGNOSIS — N390 Urinary tract infection, site not specified: Secondary | ICD-10-CM | POA: Diagnosis not present

## 2018-03-07 DIAGNOSIS — Z01419 Encounter for gynecological examination (general) (routine) without abnormal findings: Secondary | ICD-10-CM | POA: Diagnosis not present

## 2018-03-07 DIAGNOSIS — N952 Postmenopausal atrophic vaginitis: Secondary | ICD-10-CM | POA: Diagnosis not present

## 2018-03-15 ENCOUNTER — Ambulatory Visit: Payer: PPO | Admitting: Podiatry

## 2018-03-15 ENCOUNTER — Encounter: Payer: Self-pay | Admitting: Podiatry

## 2018-03-15 DIAGNOSIS — B351 Tinea unguium: Secondary | ICD-10-CM

## 2018-03-15 DIAGNOSIS — E119 Type 2 diabetes mellitus without complications: Secondary | ICD-10-CM | POA: Diagnosis not present

## 2018-03-15 DIAGNOSIS — M79676 Pain in unspecified toe(s): Secondary | ICD-10-CM

## 2018-03-15 NOTE — Progress Notes (Signed)
Patient ID: [name], female   DOB: Mar 17, 1946, 72 y.o.   MRN: [id number] Complaint:  Visit Type: Patient returns to my office for continued preventative foot care services. Complaint: Patient states" my nails have grown long and thick and become painful to walk and wear shoes" Patient has been diagnosed with DM with no complications. He presents for preventative foot care services. No changes to ROS  Podiatric Exam: Vascular: dorsalis pedis and posterior tibial pulses are palpable bilateral. Capillary return is immediate. Temperature gradient is WNL. Skin turgor WNL  Sensorium: Normal Semmes Weinstein monofilament test. Normal tactile sensation bilaterally. Nail Exam: Pt has thick disfigured discolored nails with subungual debris noted bilateral entire nail hallux through fifth toenails Ulcer Exam: There is no evidence of ulcer or pre-ulcerative changes or infection. Orthopedic Exam: Muscle tone and strength are WNL. No limitations in general ROM. No crepitus or effusions noted. Foot type and digits show no abnormalities. Bony prominences are unremarkable. Skin: No Porokeratosis. No infection or ulcers  Diagnosis:  Tinea unguium, Pain in right toe, pain in left toes  Treatment & Plan Procedures and Treatment: Consent by patient was obtained for treatment procedures. The patient understood the discussion of treatment and procedures well. All questions were answered thoroughly reviewed. Debridement of mycotic and hypertrophic toenails, 1 through 5 bilateral and clearing of subungual debris. No ulceration, no infection noted.  Return Visit-Office Procedure: Patient instructed to return to the office for a follow up visit 10 weeks  for continued evaluation and treatment.  Gardiner Barefoot DPM

## 2018-03-16 DIAGNOSIS — R3915 Urgency of urination: Secondary | ICD-10-CM | POA: Diagnosis not present

## 2018-03-24 DIAGNOSIS — R3915 Urgency of urination: Secondary | ICD-10-CM | POA: Diagnosis not present

## 2018-05-17 ENCOUNTER — Ambulatory Visit: Payer: PPO | Admitting: Podiatry

## 2018-06-10 ENCOUNTER — Ambulatory Visit: Payer: PPO | Admitting: Podiatry

## 2018-06-17 DIAGNOSIS — E1121 Type 2 diabetes mellitus with diabetic nephropathy: Secondary | ICD-10-CM | POA: Diagnosis not present

## 2018-06-17 DIAGNOSIS — I10 Essential (primary) hypertension: Secondary | ICD-10-CM | POA: Diagnosis not present

## 2018-06-17 DIAGNOSIS — Z7984 Long term (current) use of oral hypoglycemic drugs: Secondary | ICD-10-CM | POA: Diagnosis not present

## 2018-06-17 DIAGNOSIS — F325 Major depressive disorder, single episode, in full remission: Secondary | ICD-10-CM | POA: Diagnosis not present

## 2018-06-21 ENCOUNTER — Ambulatory Visit: Payer: PPO | Admitting: Podiatry

## 2018-06-21 ENCOUNTER — Encounter: Payer: Self-pay | Admitting: Podiatry

## 2018-06-21 DIAGNOSIS — M79676 Pain in unspecified toe(s): Secondary | ICD-10-CM

## 2018-06-21 DIAGNOSIS — B351 Tinea unguium: Secondary | ICD-10-CM

## 2018-06-21 DIAGNOSIS — E119 Type 2 diabetes mellitus without complications: Secondary | ICD-10-CM | POA: Diagnosis not present

## 2018-06-21 NOTE — Progress Notes (Signed)
Patient ID: Cindy Rhodes, female   DOB: 08/02/1946, 72 y.o.   MRN: [id number] Complaint:  Visit Type: Patient returns to my office for continued preventative foot care services. Complaint: Patient states" my nails have grown long and thick and become painful to walk and wear shoes" Patient has been diagnosed with DM with no complications. He presents for preventative foot care services. No changes to ROS  Podiatric Exam: Vascular: dorsalis pedis and posterior tibial pulses are palpable bilateral. Capillary return is immediate. Temperature gradient is WNL. Skin turgor WNL  Sensorium: Normal Semmes Weinstein monofilament test. Normal tactile sensation bilaterally. Nail Exam: Pt has thick disfigured discolored nails with subungual debris noted bilateral entire nail hallux through fifth toenails Ulcer Exam: There is no evidence of ulcer or pre-ulcerative changes or infection. Orthopedic Exam: Muscle tone and strength are WNL. No limitations in general ROM. No crepitus or effusions noted. Foot type and digits show no abnormalities. Bony prominences are unremarkable. Skin: No Porokeratosis. No infection or ulcers  Diagnosis:  Tinea unguium, Pain in right toe, pain in left toes  Treatment & Plan Procedures and Treatment: Consent by patient was obtained for treatment procedures. The patient understood the discussion of treatment and procedures well. All questions were answered thoroughly reviewed. Debridement of mycotic and hypertrophic toenails, 1 through 5 bilateral and clearing of subungual debris. No ulceration, no infection noted.  Return Visit-Office Procedure: Patient instructed to return to the office for a follow up visit 10 weeks  for continued evaluation and treatment.  [name] DPM 

## 2018-06-28 DIAGNOSIS — N3941 Urge incontinence: Secondary | ICD-10-CM | POA: Diagnosis not present

## 2018-06-28 DIAGNOSIS — N3020 Other chronic cystitis without hematuria: Secondary | ICD-10-CM | POA: Diagnosis not present

## 2018-07-01 DIAGNOSIS — Z7984 Long term (current) use of oral hypoglycemic drugs: Secondary | ICD-10-CM | POA: Diagnosis not present

## 2018-07-01 DIAGNOSIS — E1121 Type 2 diabetes mellitus with diabetic nephropathy: Secondary | ICD-10-CM | POA: Diagnosis not present

## 2018-07-01 DIAGNOSIS — F325 Major depressive disorder, single episode, in full remission: Secondary | ICD-10-CM | POA: Diagnosis not present

## 2018-07-01 DIAGNOSIS — I10 Essential (primary) hypertension: Secondary | ICD-10-CM | POA: Diagnosis not present

## 2018-07-25 DIAGNOSIS — E119 Type 2 diabetes mellitus without complications: Secondary | ICD-10-CM | POA: Diagnosis not present

## 2018-08-09 DIAGNOSIS — N3020 Other chronic cystitis without hematuria: Secondary | ICD-10-CM | POA: Diagnosis not present

## 2018-08-09 DIAGNOSIS — N3941 Urge incontinence: Secondary | ICD-10-CM | POA: Diagnosis not present

## 2018-08-23 ENCOUNTER — Ambulatory Visit: Payer: PPO | Admitting: Podiatry

## 2018-08-23 ENCOUNTER — Encounter: Payer: Self-pay | Admitting: Podiatry

## 2018-08-23 DIAGNOSIS — E119 Type 2 diabetes mellitus without complications: Secondary | ICD-10-CM

## 2018-08-23 DIAGNOSIS — M79676 Pain in unspecified toe(s): Secondary | ICD-10-CM

## 2018-08-23 DIAGNOSIS — B351 Tinea unguium: Secondary | ICD-10-CM

## 2018-08-23 NOTE — Progress Notes (Signed)
Patient ID: Cindy Rhodes, female   DOB: 08/18/1946, 72 y.o.   MRN: [id number] Complaint:  Visit Type: Patient returns to my office for continued preventative foot care services. Complaint: Patient states" my nails have grown long and thick and become painful to walk and wear shoes" Patient has been diagnosed with DM with no complications. He presents for preventative foot care services. No changes to ROS  Podiatric Exam: Vascular: dorsalis pedis and posterior tibial pulses are palpable bilateral. Capillary return is immediate. Temperature gradient is WNL. Skin turgor WNL  Sensorium: Normal Semmes Weinstein monofilament test. Normal tactile sensation bilaterally. Nail Exam: Pt has thick disfigured discolored nails with subungual debris noted bilateral entire nail hallux through fifth toenails Ulcer Exam: There is no evidence of ulcer or pre-ulcerative changes or infection. Orthopedic Exam: Muscle tone and strength are WNL. No limitations in general ROM. No crepitus or effusions noted. Foot type and digits show no abnormalities. Bony prominences are unremarkable. Skin: No Porokeratosis. No infection or ulcers  Diagnosis:  Tinea unguium, Pain in right toe, pain in left toes  Treatment & Plan Procedures and Treatment: Consent by patient was obtained for treatment procedures. The patient understood the discussion of treatment and procedures well. All questions were answered thoroughly reviewed. Debridement of mycotic and hypertrophic toenails, 1 through 5 bilateral and clearing of subungual debris. No ulceration, no infection noted.  Return Visit-Office Procedure: Patient instructed to return to the office for a follow up visit 10 weeks  for continued evaluation and treatment.  Reda Citron DPM 

## 2018-08-30 ENCOUNTER — Ambulatory Visit: Payer: PPO | Admitting: Podiatry

## 2018-09-08 DIAGNOSIS — Z23 Encounter for immunization: Secondary | ICD-10-CM | POA: Diagnosis not present

## 2018-09-09 DIAGNOSIS — Z803 Family history of malignant neoplasm of breast: Secondary | ICD-10-CM | POA: Diagnosis not present

## 2018-09-09 DIAGNOSIS — Z1231 Encounter for screening mammogram for malignant neoplasm of breast: Secondary | ICD-10-CM | POA: Diagnosis not present

## 2018-09-12 DIAGNOSIS — Z1389 Encounter for screening for other disorder: Secondary | ICD-10-CM | POA: Diagnosis not present

## 2018-09-12 DIAGNOSIS — Z0000 Encounter for general adult medical examination without abnormal findings: Secondary | ICD-10-CM | POA: Diagnosis not present

## 2018-09-12 DIAGNOSIS — E1121 Type 2 diabetes mellitus with diabetic nephropathy: Secondary | ICD-10-CM | POA: Diagnosis not present

## 2018-09-12 DIAGNOSIS — F325 Major depressive disorder, single episode, in full remission: Secondary | ICD-10-CM | POA: Diagnosis not present

## 2018-09-12 DIAGNOSIS — I10 Essential (primary) hypertension: Secondary | ICD-10-CM | POA: Diagnosis not present

## 2018-09-12 DIAGNOSIS — E871 Hypo-osmolality and hyponatremia: Secondary | ICD-10-CM | POA: Diagnosis not present

## 2018-09-12 DIAGNOSIS — F419 Anxiety disorder, unspecified: Secondary | ICD-10-CM | POA: Diagnosis not present

## 2018-09-12 DIAGNOSIS — E7800 Pure hypercholesterolemia, unspecified: Secondary | ICD-10-CM | POA: Diagnosis not present

## 2018-09-19 DIAGNOSIS — E871 Hypo-osmolality and hyponatremia: Secondary | ICD-10-CM | POA: Diagnosis not present

## 2018-10-03 DIAGNOSIS — E871 Hypo-osmolality and hyponatremia: Secondary | ICD-10-CM | POA: Diagnosis not present

## 2018-11-02 ENCOUNTER — Ambulatory Visit: Payer: PPO | Admitting: Podiatry

## 2018-11-02 ENCOUNTER — Encounter: Payer: Self-pay | Admitting: Podiatry

## 2018-11-02 DIAGNOSIS — B351 Tinea unguium: Secondary | ICD-10-CM

## 2018-11-02 DIAGNOSIS — E119 Type 2 diabetes mellitus without complications: Secondary | ICD-10-CM | POA: Diagnosis not present

## 2018-11-02 DIAGNOSIS — M79676 Pain in unspecified toe(s): Secondary | ICD-10-CM

## 2018-11-02 NOTE — Progress Notes (Signed)
Patient ID: Cindy Rhodes, female   DOB: 1946-07-27, 72 y.o.   MRN: [id number] Complaint:  Visit Type: Patient returns to my office for continued preventative foot care services. Complaint: Patient states" my nails have grown long and thick and become painful to walk and wear shoes" Patient has been diagnosed with DM with no complications. He presents for preventative foot care services. No changes to ROS  Podiatric Exam: Vascular: dorsalis pedis and posterior tibial pulses are palpable bilateral. Capillary return is immediate. Temperature gradient is WNL. Skin turgor WNL  Sensorium: Normal Semmes Weinstein monofilament test. Normal tactile sensation bilaterally. Nail Exam: Pt has thick disfigured discolored nails with subungual debris noted bilateral entire nail hallux through fifth toenails Ulcer Exam: There is no evidence of ulcer or pre-ulcerative changes or infection. Orthopedic Exam: Muscle tone and strength are WNL. No limitations in general ROM. No crepitus or effusions noted. Foot type and digits show no abnormalities. Bony prominences are unremarkable. Skin: No Porokeratosis. No infection or ulcers  Diagnosis:  Tinea unguium, Pain in right toe, pain in left toes  Treatment & Plan Procedures and Treatment: Consent by patient was obtained for treatment procedures. The patient understood the discussion of treatment and procedures well. All questions were answered thoroughly reviewed. Debridement of mycotic and hypertrophic toenails, 1 through 5 bilateral and clearing of subungual debris. No ulceration, no infection noted.  Return Visit-Office Procedure: Patient instructed to return to the office for a follow up visit 10 weeks  for continued evaluation and treatment.  Gardiner Barefoot DPM

## 2018-11-07 DIAGNOSIS — Z7984 Long term (current) use of oral hypoglycemic drugs: Secondary | ICD-10-CM | POA: Diagnosis not present

## 2018-11-07 DIAGNOSIS — E1121 Type 2 diabetes mellitus with diabetic nephropathy: Secondary | ICD-10-CM | POA: Diagnosis not present

## 2018-11-07 DIAGNOSIS — I10 Essential (primary) hypertension: Secondary | ICD-10-CM | POA: Diagnosis not present

## 2018-11-07 DIAGNOSIS — F325 Major depressive disorder, single episode, in full remission: Secondary | ICD-10-CM | POA: Diagnosis not present

## 2018-11-07 DIAGNOSIS — E119 Type 2 diabetes mellitus without complications: Secondary | ICD-10-CM | POA: Diagnosis not present

## 2018-12-06 DIAGNOSIS — N3020 Other chronic cystitis without hematuria: Secondary | ICD-10-CM | POA: Diagnosis not present

## 2018-12-06 DIAGNOSIS — N3941 Urge incontinence: Secondary | ICD-10-CM | POA: Diagnosis not present

## 2019-01-03 ENCOUNTER — Encounter: Payer: Self-pay | Admitting: Podiatry

## 2019-01-03 ENCOUNTER — Ambulatory Visit: Payer: PPO | Admitting: Podiatry

## 2019-01-03 DIAGNOSIS — B351 Tinea unguium: Secondary | ICD-10-CM

## 2019-01-03 DIAGNOSIS — M79676 Pain in unspecified toe(s): Secondary | ICD-10-CM | POA: Diagnosis not present

## 2019-01-03 DIAGNOSIS — E119 Type 2 diabetes mellitus without complications: Secondary | ICD-10-CM

## 2019-01-03 NOTE — Progress Notes (Signed)
Patient ID: Cindy Rhodes, female   DOB: [birth year], 73 y.o.   MRN: [id number] Complaint:  Visit Type: Patient returns to my office for continued preventative foot care services. Complaint: Patient states" my nails have grown long and thick and become painful to walk and wear shoes" Patient has been diagnosed with DM with no complications. He presents for preventative foot care services. No changes to ROS  Podiatric Exam: Vascular: dorsalis pedis and posterior tibial pulses are palpable bilateral. Capillary return is immediate. Temperature gradient is WNL. Skin turgor WNL  Sensorium: Normal Semmes Weinstein monofilament test. Normal tactile sensation bilaterally. Nail Exam: Pt has thick disfigured discolored nails with subungual debris noted bilateral entire nail hallux through fifth toenails Ulcer Exam: There is no evidence of ulcer or pre-ulcerative changes or infection. Orthopedic Exam: Muscle tone and strength are WNL. No limitations in general ROM. No crepitus or effusions noted. Foot type and digits show no abnormalities. Bony prominences are unremarkable. Skin: No Porokeratosis. No infection or ulcers  Diagnosis:  Tinea unguium, Pain in right toe, pain in left toes  Treatment & Plan Procedures and Treatment: Consent by patient was obtained for treatment procedures. The patient understood the discussion of treatment and procedures well. All questions were answered thoroughly reviewed. Debridement of mycotic and hypertrophic toenails, 1 through 5 bilateral and clearing of subungual debris. No ulceration.  Minimal abscess left hallux lateral border. Return Visit-Office Procedure: Patient instructed to return to the office for a follow up visit 10 weeks  for continued evaluation and treatment.  Gardiner Barefoot DPM

## 2019-03-14 DIAGNOSIS — R300 Dysuria: Secondary | ICD-10-CM | POA: Diagnosis not present

## 2019-03-15 DIAGNOSIS — N3000 Acute cystitis without hematuria: Secondary | ICD-10-CM | POA: Diagnosis not present

## 2019-03-15 DIAGNOSIS — N3941 Urge incontinence: Secondary | ICD-10-CM | POA: Diagnosis not present

## 2019-03-20 DIAGNOSIS — E119 Type 2 diabetes mellitus without complications: Secondary | ICD-10-CM | POA: Diagnosis not present

## 2019-03-20 DIAGNOSIS — E1121 Type 2 diabetes mellitus with diabetic nephropathy: Secondary | ICD-10-CM | POA: Diagnosis not present

## 2019-03-20 DIAGNOSIS — E7800 Pure hypercholesterolemia, unspecified: Secondary | ICD-10-CM | POA: Diagnosis not present

## 2019-03-20 DIAGNOSIS — I10 Essential (primary) hypertension: Secondary | ICD-10-CM | POA: Diagnosis not present

## 2019-03-21 ENCOUNTER — Ambulatory Visit: Payer: PPO | Admitting: Podiatry

## 2019-05-08 DIAGNOSIS — M6530 Trigger finger, unspecified finger: Secondary | ICD-10-CM | POA: Insufficient documentation

## 2019-05-08 DIAGNOSIS — M79641 Pain in right hand: Secondary | ICD-10-CM

## 2019-05-08 DIAGNOSIS — M65341 Trigger finger, right ring finger: Secondary | ICD-10-CM | POA: Diagnosis not present

## 2019-05-08 HISTORY — DX: Trigger finger, unspecified finger: M65.30

## 2019-05-08 HISTORY — DX: Pain in right hand: M79.641

## 2019-05-23 ENCOUNTER — Ambulatory Visit: Payer: PPO | Admitting: Podiatry

## 2019-07-12 DIAGNOSIS — N3020 Other chronic cystitis without hematuria: Secondary | ICD-10-CM | POA: Diagnosis not present

## 2019-07-12 DIAGNOSIS — N3941 Urge incontinence: Secondary | ICD-10-CM | POA: Diagnosis not present

## 2019-07-25 ENCOUNTER — Ambulatory Visit: Payer: PPO | Admitting: Podiatry

## 2019-07-28 ENCOUNTER — Ambulatory Visit
Admission: RE | Admit: 2019-07-28 | Discharge: 2019-07-28 | Disposition: A | Discharge status: Home / Self Care | Payer: PPO | Source: Ambulatory Visit | Attending: Internal Medicine | Admitting: Internal Medicine

## 2019-07-28 ENCOUNTER — Other Ambulatory Visit: Payer: Self-pay | Admitting: Internal Medicine

## 2019-07-28 DIAGNOSIS — S0990XA Unspecified injury of head, initial encounter: Secondary | ICD-10-CM | POA: Diagnosis not present

## 2019-07-28 DIAGNOSIS — G44311 Acute post-traumatic headache, intractable: Secondary | ICD-10-CM | POA: Diagnosis not present

## 2019-07-28 DIAGNOSIS — S0003XA Contusion of scalp, initial encounter: Secondary | ICD-10-CM | POA: Diagnosis not present

## 2019-08-09 DIAGNOSIS — L814 Other melanin hyperpigmentation: Secondary | ICD-10-CM | POA: Diagnosis not present

## 2019-08-09 DIAGNOSIS — L738 Other specified follicular disorders: Secondary | ICD-10-CM | POA: Diagnosis not present

## 2019-08-09 DIAGNOSIS — D229 Melanocytic nevi, unspecified: Secondary | ICD-10-CM | POA: Diagnosis not present

## 2019-08-09 DIAGNOSIS — L821 Other seborrheic keratosis: Secondary | ICD-10-CM | POA: Diagnosis not present

## 2019-08-09 DIAGNOSIS — I8393 Asymptomatic varicose veins of bilateral lower extremities: Secondary | ICD-10-CM | POA: Diagnosis not present

## 2019-08-09 DIAGNOSIS — K130 Diseases of lips: Secondary | ICD-10-CM | POA: Diagnosis not present

## 2019-08-09 DIAGNOSIS — D1801 Hemangioma of skin and subcutaneous tissue: Secondary | ICD-10-CM | POA: Diagnosis not present

## 2019-08-15 DIAGNOSIS — M25512 Pain in left shoulder: Secondary | ICD-10-CM | POA: Diagnosis not present

## 2019-08-15 DIAGNOSIS — M542 Cervicalgia: Secondary | ICD-10-CM | POA: Diagnosis not present

## 2019-08-18 ENCOUNTER — Encounter (HOSPITAL_COMMUNITY): Payer: Self-pay

## 2019-08-18 ENCOUNTER — Emergency Department (HOSPITAL_COMMUNITY)
Admission: EM | Admit: 2019-08-18 | Discharge: 2019-08-18 | Disposition: A | Discharge status: Home / Self Care | Payer: PPO | Attending: Emergency Medicine | Admitting: Emergency Medicine

## 2019-08-18 ENCOUNTER — Other Ambulatory Visit: Payer: Self-pay

## 2019-08-18 DIAGNOSIS — E876 Hypokalemia: Secondary | ICD-10-CM | POA: Insufficient documentation

## 2019-08-18 DIAGNOSIS — E871 Hypo-osmolality and hyponatremia: Secondary | ICD-10-CM | POA: Diagnosis not present

## 2019-08-18 DIAGNOSIS — I10 Essential (primary) hypertension: Secondary | ICD-10-CM | POA: Diagnosis not present

## 2019-08-18 DIAGNOSIS — E860 Dehydration: Secondary | ICD-10-CM | POA: Diagnosis not present

## 2019-08-18 DIAGNOSIS — Z79899 Other long term (current) drug therapy: Secondary | ICD-10-CM | POA: Diagnosis not present

## 2019-08-18 DIAGNOSIS — Z7982 Long term (current) use of aspirin: Secondary | ICD-10-CM | POA: Diagnosis not present

## 2019-08-18 DIAGNOSIS — R531 Weakness: Secondary | ICD-10-CM | POA: Diagnosis present

## 2019-08-18 DIAGNOSIS — Z7984 Long term (current) use of oral hypoglycemic drugs: Secondary | ICD-10-CM | POA: Diagnosis not present

## 2019-08-18 DIAGNOSIS — E1165 Type 2 diabetes mellitus with hyperglycemia: Secondary | ICD-10-CM | POA: Diagnosis not present

## 2019-08-18 DIAGNOSIS — Z87891 Personal history of nicotine dependence: Secondary | ICD-10-CM | POA: Insufficient documentation

## 2019-08-18 DIAGNOSIS — R739 Hyperglycemia, unspecified: Secondary | ICD-10-CM

## 2019-08-18 LAB — URINALYSIS, ROUTINE W REFLEX MICROSCOPIC
Bilirubin Urine: NEGATIVE
Glucose, UA: NEGATIVE mg/dL
Hgb urine dipstick: NEGATIVE
Ketones, ur: 20 mg/dL — AB
Leukocytes,Ua: NEGATIVE
Nitrite: NEGATIVE
Protein, ur: NEGATIVE mg/dL
Specific Gravity, Urine: 1.004 — ABNORMAL LOW (ref 1.005–1.030)
pH: 7 (ref 5.0–8.0)

## 2019-08-18 LAB — CBC
HCT: 39.4 % (ref 36.0–46.0)
Hemoglobin: 14.2 g/dL (ref 12.0–15.0)
MCH: 29.3 pg (ref 26.0–34.0)
MCHC: 36 g/dL (ref 30.0–36.0)
MCV: 81.2 fL (ref 80.0–100.0)
Platelets: 291 10*3/uL (ref 150–400)
RBC: 4.85 MIL/uL (ref 3.87–5.11)
RDW: 12.8 % (ref 11.5–15.5)
WBC: 9 10*3/uL (ref 4.0–10.5)
nRBC: 0 % (ref 0.0–0.2)

## 2019-08-18 LAB — BASIC METABOLIC PANEL
Anion gap: 17 — ABNORMAL HIGH (ref 5–15)
BUN: 9 mg/dL (ref 8–23)
CO2: 26 mmol/L (ref 22–32)
Calcium: 9.5 mg/dL (ref 8.9–10.3)
Chloride: 82 mmol/L — ABNORMAL LOW (ref 98–111)
Creatinine, Ser: 0.52 mg/dL (ref 0.44–1.00)
GFR calc Af Amer: >60 mL/min (ref >60)
GFR calc non Af Amer: >60 mL/min (ref >60)
Glucose, Bld: 171 mg/dL — ABNORMAL HIGH (ref 70–99)
Potassium: 2.9 mmol/L — ABNORMAL LOW (ref 3.5–5.1)
Sodium: 125 mmol/L — ABNORMAL LOW (ref 135–145)

## 2019-08-18 MED ORDER — POTASSIUM CHLORIDE CRYS ER 20 MEQ PO TBCR
40.0000 meq | EXTENDED_RELEASE_TABLET | Freq: Once | ORAL | Status: AC
  Administered 2019-08-18: 40 meq via ORAL
  Filled 2019-08-18: qty 2

## 2019-08-18 MED ORDER — SODIUM CHLORIDE 0.9% FLUSH
3.0000 mL | Freq: Once | INTRAVENOUS | Status: AC
  Administered 2019-08-18: 3 mL via INTRAVENOUS

## 2019-08-18 MED ORDER — TELMISARTAN 80 MG PO TABS: 80.0000 mg | ORAL_TABLET | Freq: Every day | ORAL | 0 refills | Status: AC

## 2019-08-18 MED ORDER — POTASSIUM CHLORIDE CRYS ER 20 MEQ PO TBCR: 40.0000 meq | EXTENDED_RELEASE_TABLET | Freq: Every day | ORAL | 0 refills | Status: DC

## 2019-08-18 MED ORDER — SODIUM CHLORIDE 0.9 % IV BOLUS
1000.0000 mL | Freq: Once | INTRAVENOUS | Status: AC
  Administered 2019-08-18: 18:00:00 1000 mL via INTRAVENOUS

## 2019-08-18 NOTE — ED Provider Notes (Signed)
[facility] DEPT Provider Note   CSN: [id number] Arrival date & time: 08/18/19  1402     History   Chief Complaint Chief Complaint  Patient presents with  . Fatigue    HPI Cindy Rhodes is a 73 y.o. female.     HPI  She presents for evaluation of weakness and concern for dehydration and electrolyte abnormality.  She has previously had low potassium when she had a UTI.  She started taking a leftover prescription for Cipro, 5 days ago because she was having urinary frequency and dysuria.  She had initial improvement after a day or so but now feels like the symptoms have returned.  She denies fever, chills, cough, shortness of breath, chest pain, focal weakness or paresthesia.  She feels generally weak.  She has not had any diarrhea.  There are no other known modifying factors.  Past Medical History:  Diagnosis Date  . Depression   . Diabetes mellitus without complication ([place])   . Hypertension     Patient Active Problem List   Diagnosis Date Noted  . Hypokalemia 08/12/2017  . Hyponatremia 08/12/2017  . Essential hypertension 08/12/2017  . Type 2 diabetes mellitus with hyperglycemia ([place]) 08/12/2017  . Acute lower UTI 08/12/2017    Past Surgical History:  Procedure Laterality Date  . ABDOMINAL HYSTERECTOMY    . HAND SURGERY Left      OB History   No obstetric history on file.      Home Medications    Prior to Admission medications   Medication Sig Start Date End Date Taking? Authorizing Provider  amLODipine (NORVASC) 5 MG tablet Take 5 mg by mouth daily.   Yes [provider]  aspirin EC 81 MG tablet Take 81 mg by mouth at bedtime.   Yes [provider]  Calcium Carbonate-Vitamin D (CALTRATE 600+D) 600-400 MG-UNIT per tablet Take 1 tablet by mouth 2 (two) times daily.   Yes [provider]  ciprofloxacin (CIPRO) 500 MG tablet Take 500 mg by mouth 2 (two) times daily. 08/10/19  Yes [provider]   Coenzyme Q10 (COQ-10) 50 MG CAPS Take 50 mg by mouth daily.   Yes [provider]  CRESTOR 5 MG tablet Take 2.5 mg by mouth at bedtime.  10/06/13  Yes [provider]  glucosamine-chondroitin 500-400 MG tablet Take 3 tablets by mouth daily.    Yes [provider]  ibuprofen (ADVIL,MOTRIN) 200 MG tablet Take 600 mg by mouth daily as needed (pain).   Yes [provider]  metFORMIN (GLUCOPHAGE-XR) 500 MG 24 hr tablet Take 1,000 mg by mouth 2 (two) times daily. 07/01/19  Yes [provider]  metoprolol tartrate (LOPRESSOR) 25 MG tablet Take 25 mg by mouth 2 (two) times daily.  09/30/13  Yes [provider]  Misc Natural Products (TART CHERRY ADVANCED) CAPS Take by mouth daily at 2 PM.   Yes [provider]  Multiple Vitamin (MULTIVITAMIN WITH MINERALS) TABS tablet Take 1 tablet by mouth daily.   Yes [provider]  oxybutynin (DITROPAN-XL) 10 MG 24 hr tablet Take 10 mg by mouth daily.  12/28/18  Yes [provider]  sertraline (ZOLOFT) 50 MG tablet Take 75 mg by mouth daily.  07/22/13  Yes [provider]  vitamin C (ASCORBIC ACID) 500 MG tablet Take 500 mg by mouth daily.   Yes [provider]  telmisartan-hydrochlorothiazide (MICARDIS HCT) 80-25 MG tablet Take 1 tablet by mouth daily.  07/05/18 08/18/19  Yes [provider]  potassium chloride SA (K-DUR) 20 MEQ tablet Take 2 tablets (40 mEq total) by mouth daily. 08/18/19   Daleen Bo, MD  telmisartan (MICARDIS) 80 MG tablet Take 1 tablet (80 mg total) by mouth daily. 08/18/19   Daleen Bo, MD    Family History Family History  Problem Relation Age of Onset  . Hypertension Other     Social History Social History   Tobacco Use  . Smoking status: Former Research scientist (life sciences)  . Smokeless tobacco: Never Used  . Tobacco comment: quit years ago   Substance Use Topics  . Alcohol use: No  . Drug use: No     Allergies   Meloxicam and Sulfa antibiotics    Review of Systems Review of Systems  All other systems reviewed and are negative.    Physical Exam Updated Vital Signs BP (!) 164/79   Pulse 76   Resp 18   Wt 71 kg   SpO2 100%   BMI 29.58 kg/m   Physical Exam Vitals signs and nursing note reviewed.  Constitutional:      General: She is not in acute distress.    Appearance: She is well-developed. She is not ill-appearing, toxic-appearing or diaphoretic.  HENT:     Head: Normocephalic and atraumatic.     Right Ear: External ear normal.     Left Ear: External ear normal.  Eyes:     Conjunctiva/sclera: Conjunctivae normal.     Pupils: Pupils are equal, round, and reactive to light.  Neck:     Musculoskeletal: Normal range of motion and neck supple.     Trachea: Phonation normal.  Cardiovascular:     Rate and Rhythm: Normal rate and regular rhythm.     Heart sounds: Normal heart sounds.  Pulmonary:     Effort: Pulmonary effort is normal. No respiratory distress.     Breath sounds: Normal breath sounds. No stridor.  Abdominal:     General: There is no distension.     Palpations: Abdomen is soft.     Tenderness: There is no abdominal tenderness. There is no guarding.  Genitourinary:    Comments: No costovertebral angle tenderness. Musculoskeletal: Normal range of motion.  Skin:    General: Skin is warm and dry.  Neurological:     Mental Status: She is alert and oriented to person, place, and time.     Cranial Nerves: No cranial nerve deficit.     Sensory: No sensory deficit.     Motor: No abnormal muscle tone.     Coordination: Coordination normal.  Psychiatric:        Behavior: Behavior normal.        Thought Content: Thought content normal.        Judgment: Judgment normal.      ED Treatments / Results  Labs (all labs ordered are listed, but only abnormal results are displayed) Labs Reviewed  BASIC METABOLIC PANEL - Abnormal; Notable for the following components:      Result Value   Sodium 125 (*)     Potassium 2.9 (*)    Chloride 82 (*)    Glucose, Bld 171 (*)    Anion gap 17 (*)    All other components within normal limits  URINALYSIS, ROUTINE W REFLEX MICROSCOPIC - Abnormal; Notable for the following components:   Specific Gravity, Urine 1.004 (*)    Ketones, ur 20 (*)    All other components within normal limits  CBC    EKG None  Radiology No results found.  Procedures .Critical Care Performed by: Daleen Bo, MD Authorized by: Daleen Bo, MD   Critical care provider statement:    Critical care time (minutes):  35   Critical care start time:  08/18/2019 4:30 PM   Critical care end time:  08/18/2019 6:20 PM   Critical care time was exclusive of:  Separately billable procedures and treating other patients   Critical care was necessary to treat or prevent imminent or life-threatening deterioration of the following conditions:  Metabolic crisis   Critical care was time spent personally by me on the following activities:  Blood draw for specimens, development of treatment plan with patient or surrogate, discussions with consultants, evaluation of patient's response to treatment, examination of patient, obtaining history from patient or surrogate, ordering and performing treatments and interventions, ordering and review of laboratory studies, pulse oximetry, re-evaluation of patient's condition, review of old charts and ordering and review of radiographic studies   (including critical care time)  Medications Ordered in ED Medications  sodium chloride flush (NS) 0.9 % injection 3 mL (3 mLs Intravenous Given 08/18/19 1736)  sodium chloride 0.9 % bolus 1,000 mL (1,000 mLs Intravenous Bolus from Bag 08/18/19 1735)  potassium chloride SA (K-DUR) CR tablet 40 mEq (40 mEq Oral Given 08/18/19 1736)     Initial Impression / Assessment and Plan / ED Course  I have reviewed the triage vital signs and the nursing notes.  Pertinent labs & imaging results that were available during  my care of the patient were reviewed by me and considered in my medical decision making (see chart for details).  Clinical Course as of Aug 17 1826  Fri Aug 18, 2019  1808 Normal  Urinalysis, Routine w reflex microscopic(!) [EW]  1808 Normal  CBC [EW]  1808 Normal except sodium low, potassium low, chloride low, glucose high, anion gap elevated  Basic metabolic panel(!) [EW]    Clinical Course User Index [EW] Daleen Bo, MD        Patient Vitals for the past 24 hrs:  BP Pulse Resp SpO2 Weight  08/18/19 1736 (!) 164/79 76 18 100 % -  08/18/19 1424 - - - - 71 kg    6:18 PM Reevaluation with update and discussion. After initial assessment and treatment, an updated evaluation reveals patient remains comfortable has no further complaints, findings discussed and questions answered. Daleen Bo   Medical Decision Making: Malaise secondary to mild dehydration and hypokalemia.  These are likely secondary to ongoing use of hydrochlorothiazide for blood pressure control.  Urinalysis is reassuring for lack of infection, currently.  No apparent hemodynamic instability.  Nonspecific mild glucose elevation.  She is currently taking metformin.  No indication for further ED intervention or hospitalization, at this time.  CRITICAL CARE- yes Performed by: Daleen Bo  Nursing Notes Reviewed/ Care Coordinated Applicable Imaging Reviewed Interpretation of Laboratory Data incorporated into ED treatment  The patient appears reasonably screened and/or stabilized for discharge and I doubt any other medical condition or other [facility] requiring further screening, evaluation, or treatment in the ED at this time prior to discharge.  Plan: Home Medications-continue usual except stop combined myocarditis and hydrochlorothiazide.; Home Treatments-increase potassium in diet, drink plenty of fluids; return here if the recommended treatment, does not improve the symptoms; Recommended follow up-PCP follow-up 1  week for retesting, and checkup.   Final Clinical Impressions(s) / ED Diagnoses   Final diagnoses:  Dehydration  Hypokalemia  Hyponatremia  Hyperglycemia    ED  Discharge Orders         Ordered    potassium chloride SA (K-DUR) 20 MEQ tablet  Daily     08/18/19 1824    telmisartan (MICARDIS) 80 MG tablet  Daily     08/18/19 1824           Daleen Bo, MD 08/18/19 1827

## 2019-08-18 NOTE — ED Triage Notes (Signed)
Pt states that she is concerned her potassium is low and that she is dehydrated. Pt states that she has had low K before. Pt states she has not been able to eat anything the last 24 hours. Pt states she is weak . Pt also states she had a fall, which she was evaluated for already. However, pt states she is still having left shoulder pain . Pt scheduled for an MRI early next week.

## 2019-08-18 NOTE — Discharge Instructions (Signed)
Your weakness, and not feeling well is likely secondary to mild dehydration.  It appears that this is caused by use of hydrochlorothiazide which is combined with the telmisartan.  We sent a new prescription to your pharmacist, which is the telmisartan without hydrochlorothiazide.  Start taking it tomorrow.  This medicine is also because your potassium and sodium to be low.  We sent a prescription for potassium to your pharmacy.  Try eating foods which contain more potassium.  See the notes on this document.  Your sodium should gradually improve on its own.

## 2019-08-23 DIAGNOSIS — M25511 Pain in right shoulder: Secondary | ICD-10-CM | POA: Diagnosis not present

## 2019-08-24 DIAGNOSIS — E876 Hypokalemia: Secondary | ICD-10-CM | POA: Diagnosis not present

## 2019-08-24 DIAGNOSIS — N390 Urinary tract infection, site not specified: Secondary | ICD-10-CM | POA: Diagnosis not present

## 2019-08-24 DIAGNOSIS — M25512 Pain in left shoulder: Secondary | ICD-10-CM | POA: Diagnosis not present

## 2019-08-25 DIAGNOSIS — M542 Cervicalgia: Secondary | ICD-10-CM | POA: Diagnosis not present

## 2019-08-28 DIAGNOSIS — M542 Cervicalgia: Secondary | ICD-10-CM

## 2019-08-28 HISTORY — DX: Cervicalgia: M54.2

## 2019-08-30 ENCOUNTER — Ambulatory Visit: Payer: PPO | Admitting: Podiatry

## 2019-08-30 ENCOUNTER — Encounter: Payer: Self-pay | Admitting: Podiatry

## 2019-08-30 ENCOUNTER — Other Ambulatory Visit: Payer: Self-pay

## 2019-08-30 DIAGNOSIS — E119 Type 2 diabetes mellitus without complications: Secondary | ICD-10-CM | POA: Diagnosis not present

## 2019-08-30 DIAGNOSIS — M79676 Pain in unspecified toe(s): Secondary | ICD-10-CM | POA: Diagnosis not present

## 2019-08-30 DIAGNOSIS — B351 Tinea unguium: Secondary | ICD-10-CM | POA: Diagnosis not present

## 2019-08-30 NOTE — Progress Notes (Signed)
Patient ID: Cindy Rhodes, female   DOB: 26-Nov-1946, 73 y.o.   MRN: [id number] Complaint:  Visit Type: Patient returns to my office for continued preventative foot care services. Complaint: Patient states" my nails have grown long and thick and become painful to walk and wear shoes" Patient has been diagnosed with DM with no complications. He presents for preventative foot care services. No changes to ROS  Podiatric Exam: Vascular: dorsalis pedis and posterior tibial pulses are palpable bilateral. Capillary return is immediate. Temperature gradient is WNL. Skin turgor WNL  Sensorium: Normal Semmes Weinstein monofilament test. Normal tactile sensation bilaterally. Nail Exam: Pt has thick disfigured discolored nails with subungual debris noted bilateral entire nail hallux through fifth toenails Ulcer Exam: There is no evidence of ulcer or pre-ulcerative changes or infection. Orthopedic Exam: Muscle tone and strength are WNL. No limitations in general ROM. No crepitus or effusions noted. Foot type and digits show no abnormalities. Bony prominences are unremarkable. Skin: No Porokeratosis. No infection or ulcers  Diagnosis:  Tinea unguium, Pain in right toe, pain in left toes  Treatment & Plan Procedures and Treatment: Consent by patient was obtained for treatment procedures. The patient understood the discussion of treatment and procedures well. All questions were answered thoroughly reviewed. Debridement of mycotic and hypertrophic toenails, 1 through 5 bilateral and clearing of subungual debris. No ulceration.   Return Visit-Office Procedure: Patient instructed to return to the office for a follow up visit 10 weeks  for continued evaluation and treatment.  Gardiner Barefoot DPM

## 2019-09-01 DIAGNOSIS — M25512 Pain in left shoulder: Secondary | ICD-10-CM | POA: Diagnosis not present

## 2019-09-01 DIAGNOSIS — M542 Cervicalgia: Secondary | ICD-10-CM | POA: Diagnosis not present

## 2019-09-07 DIAGNOSIS — M542 Cervicalgia: Secondary | ICD-10-CM | POA: Diagnosis not present

## 2019-09-11 DIAGNOSIS — M542 Cervicalgia: Secondary | ICD-10-CM | POA: Diagnosis not present

## 2019-09-14 DIAGNOSIS — M542 Cervicalgia: Secondary | ICD-10-CM | POA: Diagnosis not present

## 2019-09-18 DIAGNOSIS — M542 Cervicalgia: Secondary | ICD-10-CM | POA: Diagnosis not present

## 2019-09-21 DIAGNOSIS — M542 Cervicalgia: Secondary | ICD-10-CM | POA: Diagnosis not present

## 2019-09-23 DIAGNOSIS — Z23 Encounter for immunization: Secondary | ICD-10-CM | POA: Diagnosis not present

## 2019-09-25 DIAGNOSIS — M542 Cervicalgia: Secondary | ICD-10-CM | POA: Diagnosis not present

## 2019-09-28 DIAGNOSIS — M542 Cervicalgia: Secondary | ICD-10-CM | POA: Diagnosis not present

## 2019-10-02 DIAGNOSIS — M542 Cervicalgia: Secondary | ICD-10-CM | POA: Diagnosis not present

## 2019-10-10 DIAGNOSIS — F325 Major depressive disorder, single episode, in full remission: Secondary | ICD-10-CM | POA: Diagnosis not present

## 2019-10-10 DIAGNOSIS — E871 Hypo-osmolality and hyponatremia: Secondary | ICD-10-CM | POA: Diagnosis not present

## 2019-10-10 DIAGNOSIS — Z0000 Encounter for general adult medical examination without abnormal findings: Secondary | ICD-10-CM | POA: Diagnosis not present

## 2019-10-10 DIAGNOSIS — Z1389 Encounter for screening for other disorder: Secondary | ICD-10-CM | POA: Diagnosis not present

## 2019-10-10 DIAGNOSIS — E1169 Type 2 diabetes mellitus with other specified complication: Secondary | ICD-10-CM | POA: Diagnosis not present

## 2019-10-10 DIAGNOSIS — E7800 Pure hypercholesterolemia, unspecified: Secondary | ICD-10-CM | POA: Diagnosis not present

## 2019-10-10 DIAGNOSIS — I10 Essential (primary) hypertension: Secondary | ICD-10-CM | POA: Diagnosis not present

## 2019-10-13 DIAGNOSIS — M4802 Spinal stenosis, cervical region: Secondary | ICD-10-CM | POA: Diagnosis not present

## 2019-10-13 DIAGNOSIS — M5030 Other cervical disc degeneration, unspecified cervical region: Secondary | ICD-10-CM

## 2019-10-13 HISTORY — DX: Other cervical disc degeneration, unspecified cervical region: M50.30

## 2019-10-13 HISTORY — DX: Spinal stenosis, cervical region: M48.02

## 2019-10-23 DIAGNOSIS — M4802 Spinal stenosis, cervical region: Secondary | ICD-10-CM | POA: Diagnosis not present

## 2019-10-23 DIAGNOSIS — M5030 Other cervical disc degeneration, unspecified cervical region: Secondary | ICD-10-CM | POA: Diagnosis not present

## 2019-10-23 DIAGNOSIS — M542 Cervicalgia: Secondary | ICD-10-CM | POA: Diagnosis not present

## 2019-11-08 ENCOUNTER — Encounter: Payer: Self-pay | Admitting: Podiatry

## 2019-11-08 ENCOUNTER — Ambulatory Visit: Payer: PPO | Admitting: Podiatry

## 2019-11-08 ENCOUNTER — Other Ambulatory Visit: Payer: Self-pay

## 2019-11-08 DIAGNOSIS — B351 Tinea unguium: Secondary | ICD-10-CM | POA: Diagnosis not present

## 2019-11-08 DIAGNOSIS — E119 Type 2 diabetes mellitus without complications: Secondary | ICD-10-CM

## 2019-11-08 DIAGNOSIS — M79676 Pain in unspecified toe(s): Secondary | ICD-10-CM

## 2019-11-08 NOTE — Progress Notes (Signed)
Patient ID: Cindy Rhodes, female   DOB: 26-Nov-1946, 73 y.o.   MRN: [id number] Complaint:  Visit Type: Patient returns to my office for continued preventative foot care services. Complaint: Patient states" my nails have grown long and thick and become painful to walk and wear shoes" Patient has been diagnosed with DM with no complications. He presents for preventative foot care services. No changes to ROS  Podiatric Exam: Vascular: dorsalis pedis and posterior tibial pulses are palpable bilateral. Capillary return is immediate. Temperature gradient is WNL. Skin turgor WNL  Sensorium: Normal Semmes Weinstein monofilament test. Normal tactile sensation bilaterally. Nail Exam: Pt has thick disfigured discolored nails with subungual debris noted bilateral entire nail hallux through fifth toenails Ulcer Exam: There is no evidence of ulcer or pre-ulcerative changes or infection. Orthopedic Exam: Muscle tone and strength are WNL. No limitations in general ROM. No crepitus or effusions noted. Foot type and digits show no abnormalities. Bony prominences are unremarkable. Skin: No Porokeratosis. No infection or ulcers  Diagnosis:  Tinea unguium, Pain in right toe, pain in left toes  Treatment & Plan Procedures and Treatment: Consent by patient was obtained for treatment procedures. The patient understood the discussion of treatment and procedures well. All questions were answered thoroughly reviewed. Debridement of mycotic and hypertrophic toenails, 1 through 5 bilateral and clearing of subungual debris. No ulceration.   Return Visit-Office Procedure: Patient instructed to return to the office for a follow up visit 10 weeks  for continued evaluation and treatment.  Gardiner Barefoot DPM

## 2020-01-24 ENCOUNTER — Ambulatory Visit: Payer: PPO | Admitting: Podiatry

## 2020-01-24 ENCOUNTER — Other Ambulatory Visit: Payer: Self-pay

## 2020-01-24 ENCOUNTER — Encounter: Payer: Self-pay | Admitting: Podiatry

## 2020-01-24 DIAGNOSIS — B351 Tinea unguium: Secondary | ICD-10-CM | POA: Diagnosis not present

## 2020-01-24 DIAGNOSIS — L608 Other nail disorders: Secondary | ICD-10-CM | POA: Insufficient documentation

## 2020-01-24 DIAGNOSIS — M79676 Pain in unspecified toe(s): Secondary | ICD-10-CM

## 2020-01-24 DIAGNOSIS — E119 Type 2 diabetes mellitus without complications: Secondary | ICD-10-CM

## 2020-01-24 HISTORY — DX: Other nail disorders: L60.8

## 2020-01-24 NOTE — Progress Notes (Signed)
Patient ID: Cindy Rhodes, female   DOB: [birth year], 74 y.o.   MRN: [id number] Complaint:  Visit Type: Patient returns to my office for continued preventative foot care services. Complaint: Patient states" my nails have grown long and thick and become painful to walk and wear shoes" Patient has been diagnosed with DM with no complications. He presents for preventative foot care services. No changes to ROS  Podiatric Exam: Vascular: dorsalis pedis and posterior tibial pulses are palpable bilateral. Capillary return is immediate. Temperature gradient is WNL. Skin turgor WNL  Sensorium: Normal Semmes Weinstein monofilament test. Normal tactile sensation bilaterally. Nail Exam: Pt has thick disfigured discolored nails with subungual debris noted bilateral entire nail hallux through fifth toenails Ulcer Exam: There is no evidence of ulcer or pre-ulcerative changes or infection. Orthopedic Exam: Muscle tone and strength are WNL. No limitations in general ROM. No crepitus or effusions noted. Foot type and digits show no abnormalities. Bony prominences are unremarkable. Skin: No Porokeratosis. No infection or ulcers  Diagnosis:  Tinea unguium, Pain in right toe, pain in left toes  Treatment & Plan Procedures and Treatment: Consent by patient was obtained for treatment procedures. The patient understood the discussion of treatment and procedures well. All questions were answered thoroughly reviewed. Debridement of mycotic and hypertrophic toenails, 1 through 5 bilateral and clearing of subungual debris. No ulceration.   Return Visit-Office Procedure: Patient instructed to return to the office for a follow up visit 10 weeks  for continued evaluation and treatment.  Gardiner Barefoot DPM

## 2020-03-25 DIAGNOSIS — Z1231 Encounter for screening mammogram for malignant neoplasm of breast: Secondary | ICD-10-CM | POA: Diagnosis not present

## 2020-04-02 DIAGNOSIS — R8290 Unspecified abnormal findings in urine: Secondary | ICD-10-CM | POA: Diagnosis not present

## 2020-04-02 DIAGNOSIS — Z01419 Encounter for gynecological examination (general) (routine) without abnormal findings: Secondary | ICD-10-CM | POA: Diagnosis not present

## 2020-04-03 ENCOUNTER — Ambulatory Visit: Payer: PPO | Admitting: Podiatry

## 2020-04-03 ENCOUNTER — Other Ambulatory Visit: Payer: Self-pay

## 2020-04-03 ENCOUNTER — Encounter: Payer: Self-pay | Admitting: Podiatry

## 2020-04-03 VITALS — Temp 97.0°F

## 2020-04-03 DIAGNOSIS — R8290 Unspecified abnormal findings in urine: Secondary | ICD-10-CM | POA: Diagnosis not present

## 2020-04-03 DIAGNOSIS — B351 Tinea unguium: Secondary | ICD-10-CM

## 2020-04-03 DIAGNOSIS — L608 Other nail disorders: Secondary | ICD-10-CM | POA: Diagnosis not present

## 2020-04-03 DIAGNOSIS — E119 Type 2 diabetes mellitus without complications: Secondary | ICD-10-CM | POA: Diagnosis not present

## 2020-04-03 DIAGNOSIS — M79676 Pain in unspecified toe(s): Secondary | ICD-10-CM | POA: Diagnosis not present

## 2020-04-03 NOTE — Progress Notes (Signed)

## 2020-04-08 DIAGNOSIS — E1121 Type 2 diabetes mellitus with diabetic nephropathy: Secondary | ICD-10-CM | POA: Diagnosis not present

## 2020-04-08 DIAGNOSIS — E7800 Pure hypercholesterolemia, unspecified: Secondary | ICD-10-CM | POA: Diagnosis not present

## 2020-04-08 DIAGNOSIS — I10 Essential (primary) hypertension: Secondary | ICD-10-CM | POA: Diagnosis not present

## 2020-04-08 DIAGNOSIS — M8588 Other specified disorders of bone density and structure, other site: Secondary | ICD-10-CM | POA: Diagnosis not present

## 2020-04-08 DIAGNOSIS — E1169 Type 2 diabetes mellitus with other specified complication: Secondary | ICD-10-CM | POA: Diagnosis not present

## 2020-04-23 DIAGNOSIS — M4802 Spinal stenosis, cervical region: Secondary | ICD-10-CM | POA: Diagnosis not present

## 2020-04-30 DIAGNOSIS — M542 Cervicalgia: Secondary | ICD-10-CM | POA: Diagnosis not present

## 2020-05-06 DIAGNOSIS — E119 Type 2 diabetes mellitus without complications: Secondary | ICD-10-CM | POA: Diagnosis not present

## 2020-05-06 DIAGNOSIS — Z7984 Long term (current) use of oral hypoglycemic drugs: Secondary | ICD-10-CM | POA: Diagnosis not present

## 2020-05-06 DIAGNOSIS — N390 Urinary tract infection, site not specified: Secondary | ICD-10-CM | POA: Diagnosis not present

## 2020-05-28 DIAGNOSIS — H524 Presbyopia: Secondary | ICD-10-CM | POA: Diagnosis not present

## 2020-05-28 DIAGNOSIS — H2513 Age-related nuclear cataract, bilateral: Secondary | ICD-10-CM | POA: Diagnosis not present

## 2020-05-28 DIAGNOSIS — H5203 Hypermetropia, bilateral: Secondary | ICD-10-CM | POA: Diagnosis not present

## 2020-05-28 DIAGNOSIS — E119 Type 2 diabetes mellitus without complications: Secondary | ICD-10-CM | POA: Diagnosis not present

## 2020-06-06 DIAGNOSIS — N3941 Urge incontinence: Secondary | ICD-10-CM | POA: Diagnosis not present

## 2020-06-06 DIAGNOSIS — N3020 Other chronic cystitis without hematuria: Secondary | ICD-10-CM | POA: Diagnosis not present

## 2020-06-23 DIAGNOSIS — M542 Cervicalgia: Secondary | ICD-10-CM | POA: Diagnosis not present

## 2020-06-23 DIAGNOSIS — Z888 Allergy status to other drugs, medicaments and biological substances status: Secondary | ICD-10-CM | POA: Diagnosis not present

## 2020-06-23 DIAGNOSIS — R220 Localized swelling, mass and lump, head: Secondary | ICD-10-CM | POA: Diagnosis not present

## 2020-06-23 DIAGNOSIS — Z7984 Long term (current) use of oral hypoglycemic drugs: Secondary | ICD-10-CM | POA: Diagnosis not present

## 2020-06-23 DIAGNOSIS — Z882 Allergy status to sulfonamides status: Secondary | ICD-10-CM | POA: Diagnosis not present

## 2020-06-23 DIAGNOSIS — W1830XA Fall on same level, unspecified, initial encounter: Secondary | ICD-10-CM | POA: Diagnosis not present

## 2020-06-23 DIAGNOSIS — S022XXA Fracture of nasal bones, initial encounter for closed fracture: Secondary | ICD-10-CM | POA: Diagnosis not present

## 2020-06-23 DIAGNOSIS — Z7982 Long term (current) use of aspirin: Secondary | ICD-10-CM | POA: Diagnosis not present

## 2020-06-23 DIAGNOSIS — Y92480 Sidewalk as the place of occurrence of the external cause: Secondary | ICD-10-CM | POA: Diagnosis not present

## 2020-06-23 DIAGNOSIS — S199XXA Unspecified injury of neck, initial encounter: Secondary | ICD-10-CM | POA: Diagnosis not present

## 2020-06-23 DIAGNOSIS — S0990XA Unspecified injury of head, initial encounter: Secondary | ICD-10-CM | POA: Diagnosis not present

## 2020-06-26 ENCOUNTER — Ambulatory Visit: Payer: PPO | Admitting: Podiatry

## 2020-07-04 DIAGNOSIS — W19XXXD Unspecified fall, subsequent encounter: Secondary | ICD-10-CM | POA: Diagnosis not present

## 2020-07-04 DIAGNOSIS — S022XXA Fracture of nasal bones, initial encounter for closed fracture: Secondary | ICD-10-CM | POA: Diagnosis not present

## 2020-08-30 DIAGNOSIS — Z20828 Contact with and (suspected) exposure to other viral communicable diseases: Secondary | ICD-10-CM | POA: Diagnosis not present

## 2020-09-13 DIAGNOSIS — H6123 Impacted cerumen, bilateral: Secondary | ICD-10-CM | POA: Diagnosis not present

## 2020-09-24 DIAGNOSIS — R8271 Bacteriuria: Secondary | ICD-10-CM | POA: Diagnosis not present

## 2020-09-24 DIAGNOSIS — N3020 Other chronic cystitis without hematuria: Secondary | ICD-10-CM | POA: Diagnosis not present

## 2020-10-08 ENCOUNTER — Other Ambulatory Visit: Payer: Self-pay

## 2020-10-08 ENCOUNTER — Encounter: Payer: Self-pay | Admitting: Podiatry

## 2020-10-08 ENCOUNTER — Ambulatory Visit: Payer: PPO | Admitting: Podiatry

## 2020-10-08 DIAGNOSIS — B351 Tinea unguium: Secondary | ICD-10-CM | POA: Diagnosis not present

## 2020-10-08 DIAGNOSIS — M79676 Pain in unspecified toe(s): Secondary | ICD-10-CM

## 2020-10-08 DIAGNOSIS — L608 Other nail disorders: Secondary | ICD-10-CM | POA: Diagnosis not present

## 2020-10-08 DIAGNOSIS — E119 Type 2 diabetes mellitus without complications: Secondary | ICD-10-CM | POA: Diagnosis not present

## 2020-10-08 NOTE — Progress Notes (Signed)

## 2020-10-17 DIAGNOSIS — E2839 Other primary ovarian failure: Secondary | ICD-10-CM | POA: Diagnosis not present

## 2020-10-17 DIAGNOSIS — E1169 Type 2 diabetes mellitus with other specified complication: Secondary | ICD-10-CM | POA: Diagnosis not present

## 2020-10-17 DIAGNOSIS — I10 Essential (primary) hypertension: Secondary | ICD-10-CM | POA: Diagnosis not present

## 2020-10-17 DIAGNOSIS — Z5181 Encounter for therapeutic drug level monitoring: Secondary | ICD-10-CM | POA: Diagnosis not present

## 2020-10-17 DIAGNOSIS — E7800 Pure hypercholesterolemia, unspecified: Secondary | ICD-10-CM | POA: Diagnosis not present

## 2020-10-17 DIAGNOSIS — F325 Major depressive disorder, single episode, in full remission: Secondary | ICD-10-CM | POA: Diagnosis not present

## 2020-10-17 DIAGNOSIS — Z0000 Encounter for general adult medical examination without abnormal findings: Secondary | ICD-10-CM | POA: Diagnosis not present

## 2020-10-21 DIAGNOSIS — R7989 Other specified abnormal findings of blood chemistry: Secondary | ICD-10-CM | POA: Diagnosis not present

## 2020-12-17 ENCOUNTER — Ambulatory Visit: Payer: PPO | Admitting: Podiatry

## 2020-12-17 ENCOUNTER — Encounter: Payer: Self-pay | Admitting: Podiatry

## 2020-12-17 ENCOUNTER — Other Ambulatory Visit: Payer: Self-pay

## 2020-12-17 DIAGNOSIS — M79676 Pain in unspecified toe(s): Secondary | ICD-10-CM | POA: Diagnosis not present

## 2020-12-17 DIAGNOSIS — B351 Tinea unguium: Secondary | ICD-10-CM

## 2020-12-17 DIAGNOSIS — E119 Type 2 diabetes mellitus without complications: Secondary | ICD-10-CM

## 2020-12-17 DIAGNOSIS — L608 Other nail disorders: Secondary | ICD-10-CM

## 2020-12-17 NOTE — Progress Notes (Signed)

## 2020-12-27 DIAGNOSIS — L814 Other melanin hyperpigmentation: Secondary | ICD-10-CM | POA: Diagnosis not present

## 2020-12-27 DIAGNOSIS — Z85828 Personal history of other malignant neoplasm of skin: Secondary | ICD-10-CM | POA: Diagnosis not present

## 2020-12-27 DIAGNOSIS — D1801 Hemangioma of skin and subcutaneous tissue: Secondary | ICD-10-CM | POA: Diagnosis not present

## 2020-12-27 DIAGNOSIS — I8393 Asymptomatic varicose veins of bilateral lower extremities: Secondary | ICD-10-CM | POA: Diagnosis not present

## 2020-12-27 DIAGNOSIS — L298 Other pruritus: Secondary | ICD-10-CM | POA: Diagnosis not present

## 2020-12-27 DIAGNOSIS — D229 Melanocytic nevi, unspecified: Secondary | ICD-10-CM | POA: Diagnosis not present

## 2020-12-27 DIAGNOSIS — L853 Xerosis cutis: Secondary | ICD-10-CM | POA: Diagnosis not present

## 2020-12-27 DIAGNOSIS — L718 Other rosacea: Secondary | ICD-10-CM | POA: Diagnosis not present

## 2020-12-27 DIAGNOSIS — L819 Disorder of pigmentation, unspecified: Secondary | ICD-10-CM | POA: Diagnosis not present

## 2020-12-27 DIAGNOSIS — L905 Scar conditions and fibrosis of skin: Secondary | ICD-10-CM | POA: Diagnosis not present

## 2020-12-27 DIAGNOSIS — L821 Other seborrheic keratosis: Secondary | ICD-10-CM | POA: Diagnosis not present

## 2021-02-03 DIAGNOSIS — R8271 Bacteriuria: Secondary | ICD-10-CM | POA: Diagnosis not present

## 2021-02-03 DIAGNOSIS — N3000 Acute cystitis without hematuria: Secondary | ICD-10-CM | POA: Diagnosis not present

## 2021-03-03 DIAGNOSIS — U071 COVID-19: Secondary | ICD-10-CM | POA: Diagnosis not present

## 2021-03-03 DIAGNOSIS — Z20822 Contact with and (suspected) exposure to covid-19: Secondary | ICD-10-CM | POA: Diagnosis not present

## 2021-03-04 ENCOUNTER — Encounter: Payer: Self-pay | Admitting: Podiatry

## 2021-03-04 ENCOUNTER — Other Ambulatory Visit: Payer: Self-pay

## 2021-03-04 ENCOUNTER — Ambulatory Visit: Payer: PPO | Admitting: Podiatry

## 2021-03-04 DIAGNOSIS — L608 Other nail disorders: Secondary | ICD-10-CM | POA: Diagnosis not present

## 2021-03-04 DIAGNOSIS — M79676 Pain in unspecified toe(s): Secondary | ICD-10-CM

## 2021-03-04 DIAGNOSIS — E119 Type 2 diabetes mellitus without complications: Secondary | ICD-10-CM | POA: Diagnosis not present

## 2021-03-04 DIAGNOSIS — B351 Tinea unguium: Secondary | ICD-10-CM | POA: Diagnosis not present

## 2021-03-04 NOTE — Progress Notes (Signed)

## 2021-03-24 DIAGNOSIS — N3020 Other chronic cystitis without hematuria: Secondary | ICD-10-CM | POA: Diagnosis not present

## 2021-03-24 DIAGNOSIS — N3941 Urge incontinence: Secondary | ICD-10-CM | POA: Diagnosis not present

## 2021-03-24 DIAGNOSIS — R300 Dysuria: Secondary | ICD-10-CM | POA: Diagnosis not present

## 2021-04-07 IMAGING — CT CT HEAD WITHOUT CONTRAST
4 series · 16 of 47 positions shown, 18 images · non-contrast
Comparison: None.

CLINICAL DATA: Recent fall with pain.

EXAM:
CT HEAD WITHOUT CONTRAST
TECHNIQUE: Contiguous axial images were obtained from the base of the skull
through the vertex without intravenous contrast.

[Series 2: head 5.00 hr40 s3 axial ibhc · axial · 0.40mm/px · z∈[-586,-451]mm · 7 of 37 slices shown, 9 images]
[im 5/37  brain]
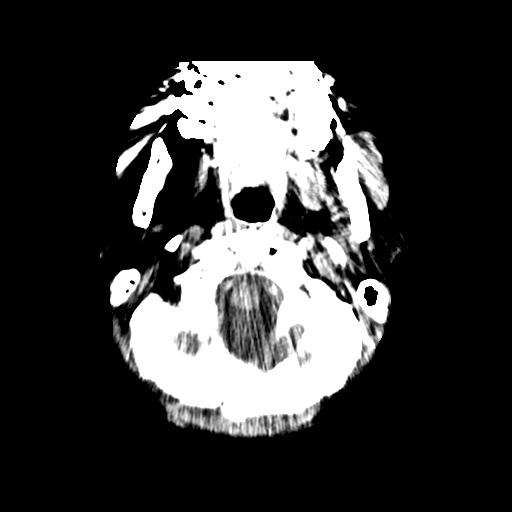
[im 5/37  bone]
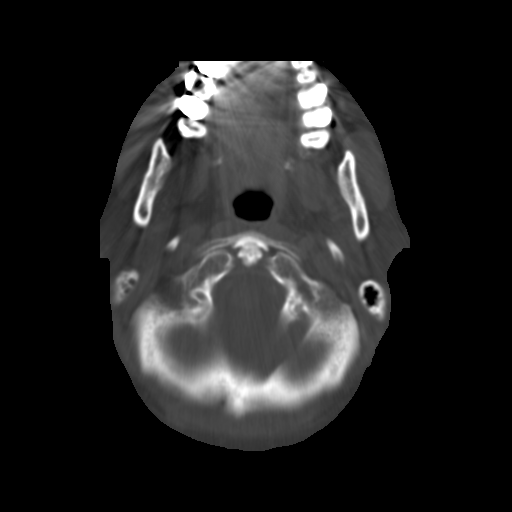
[im 10/37  brain]
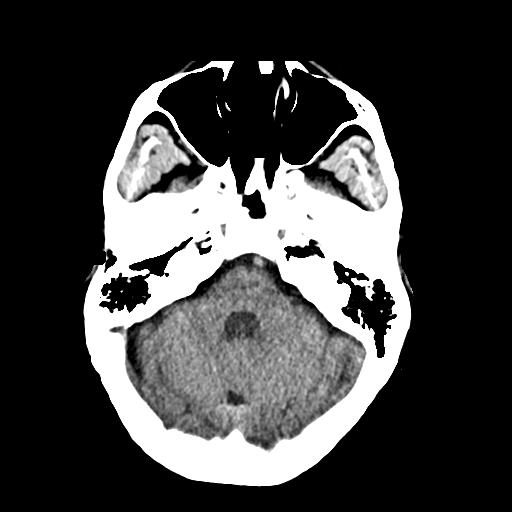
[im 14/37  brain]
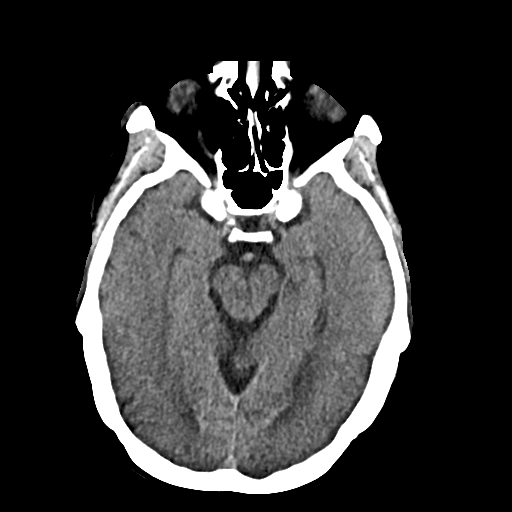
[im 19/37  brain]
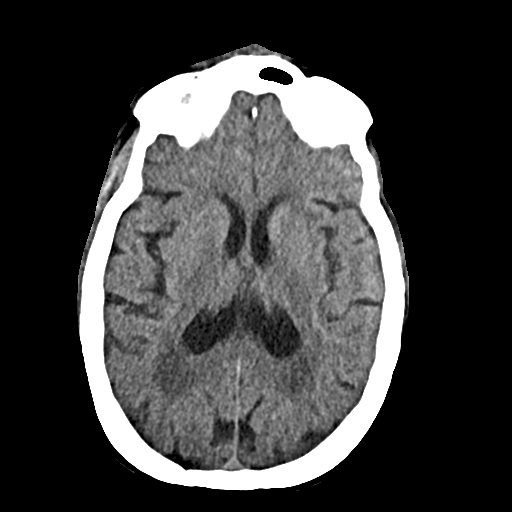
[im 23/37  brain]
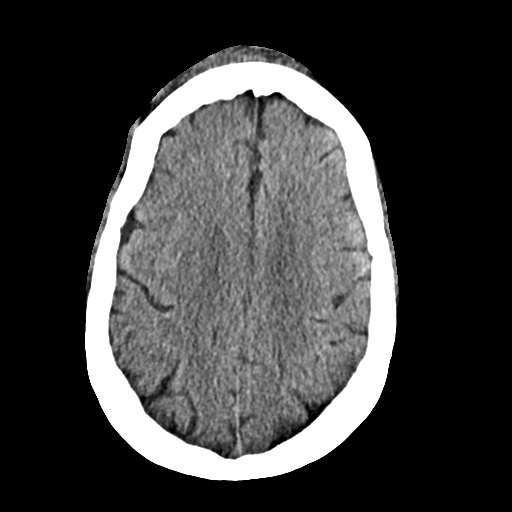
[im 23/37  bone]
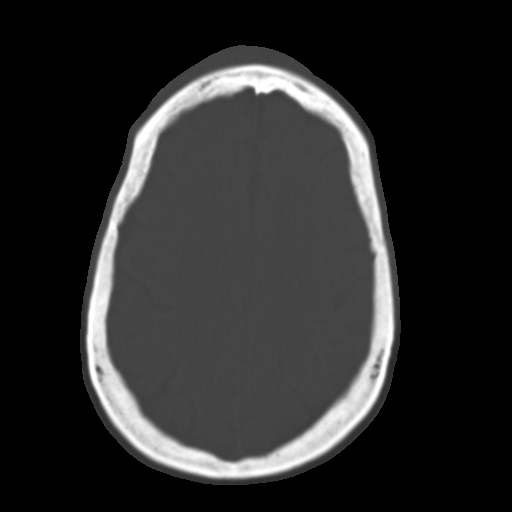
[im 28/37  brain]
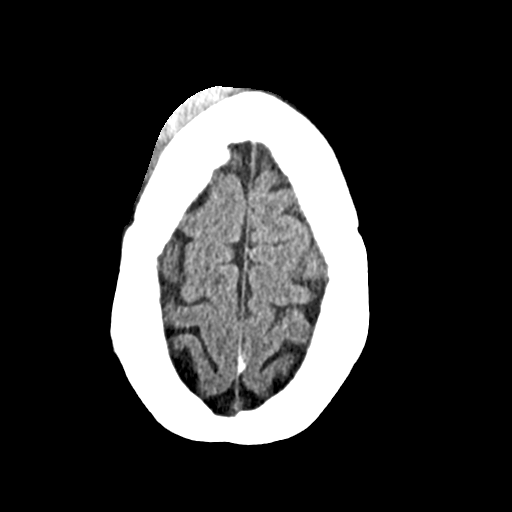
[im 32/37  brain]
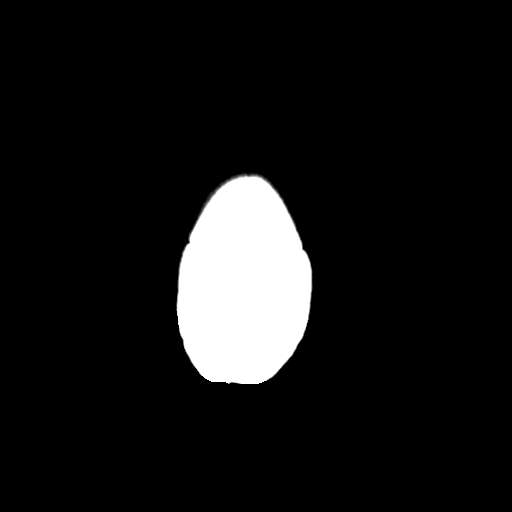

[Series 3: head 2.00 hr60 s3 axial bone · axial · 0.43mm/px · z∈[-590,-554]mm · 3 of 93 slices shown]
[im 10/93  bone]
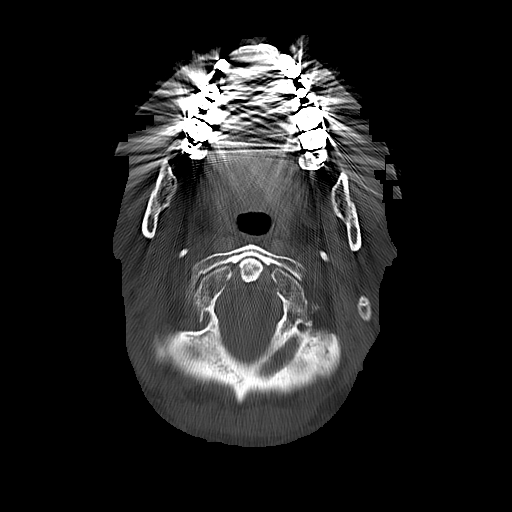
[im 19/93  bone]
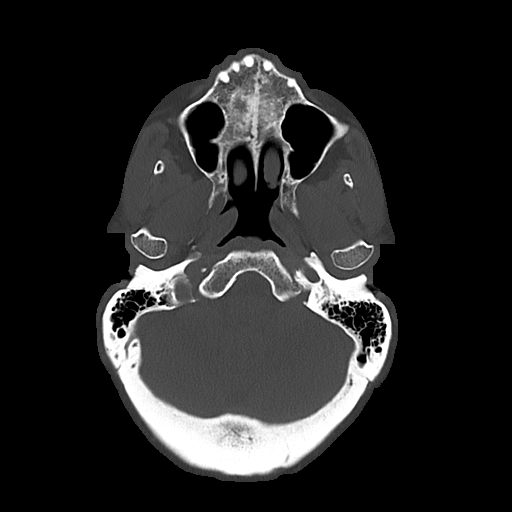
[im 28/93  bone]
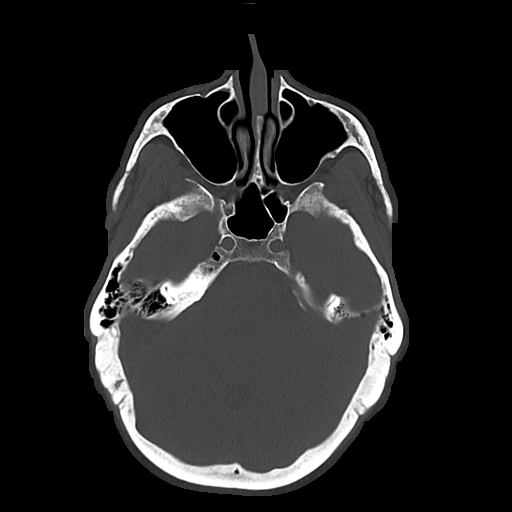

[Series 4: head 3.00 hr40 s3 sag · sagittal · 0.37mm/px · 3 of 79 slices shown]
[im 27/79  brain]
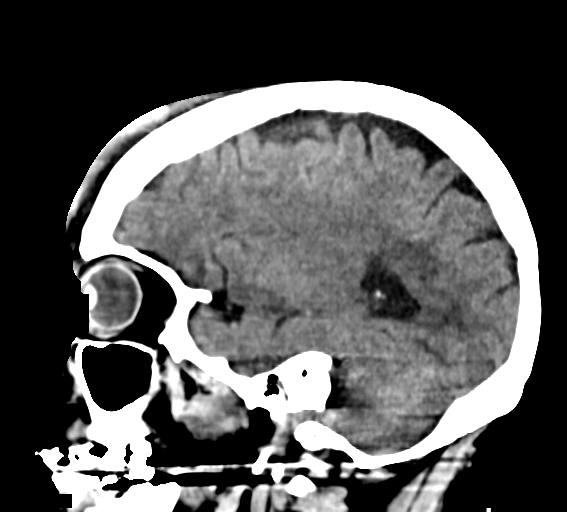
[im 40/79  brain]
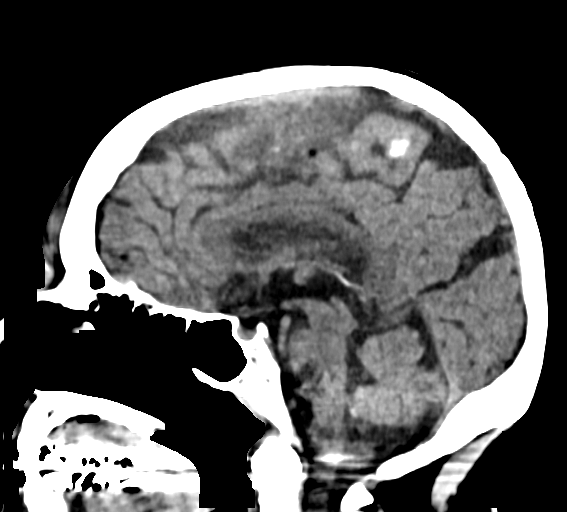
[im 53/79  brain]
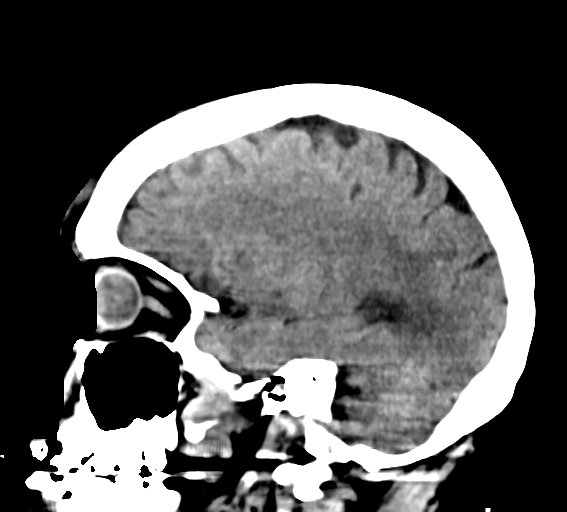

[Series 6: head 3.00 hr40 s3 cor · coronal · 0.32mm/px · 3 of 103 slices shown]
[im 35/103  brain]
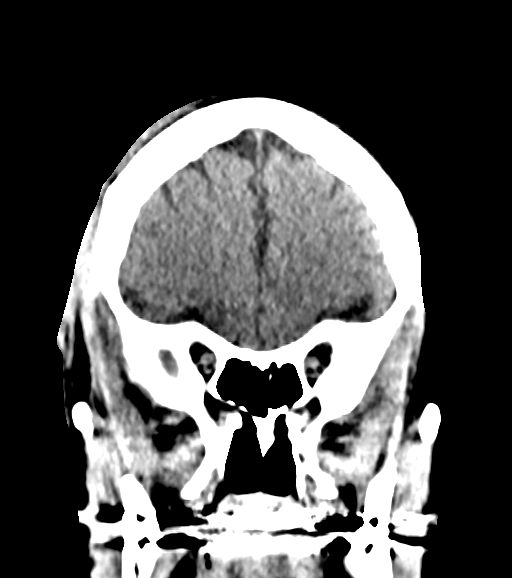
[im 46/103  brain]
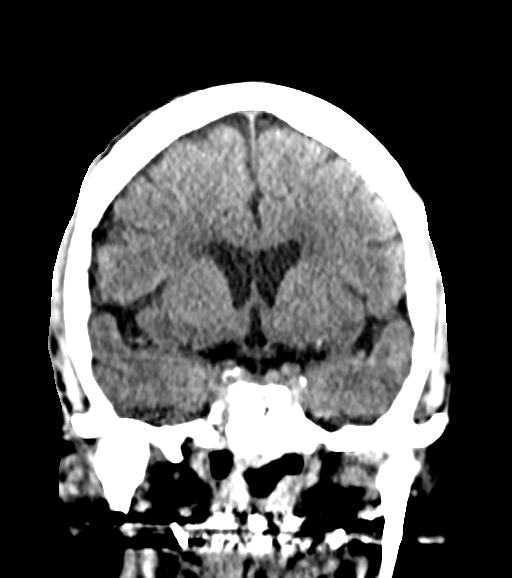
[im 57/103  brain]
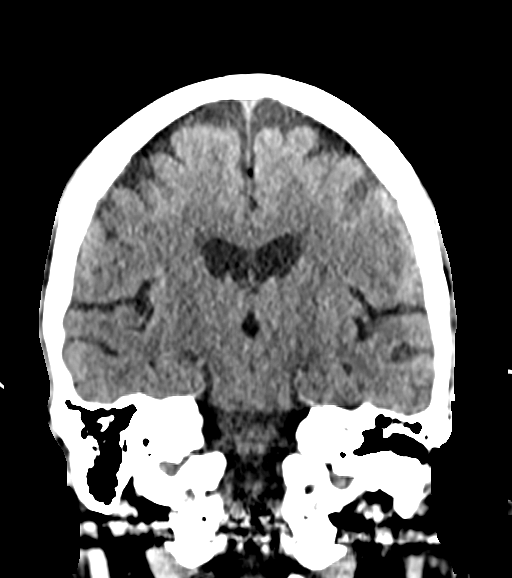

[16 of 47 positions shown; findings below may reference images not displayed]

FINDINGS: Brain: There is age related volume loss. There is no intracranial
mass, hemorrhage, extra-axial fluid collection, or midline shift.
There is small vessel disease in the centra semiovale bilaterally.
No acute infarct is demonstrable.

Vascular: There is no hyperdense vessel. There is calcification in
each carotid siphon region.

Skull: Bony calvarium appears intact. There is a right frontal scalp
hematoma.

Sinuses/Orbits: Paranasal sinuses are clear. Intraorbital regions
appear symmetric bilaterally. There is preseptal soft tissue
swelling and hematoma over the right eye region.

Other: Mastoid air cells are clear.
IMPRESSION: Age related volume loss with patchy periventricular small vessel
disease. No acute infarct evident. No mass or hemorrhage. No
extra-axial fluid.

There are foci of arterial vascular calcification.

There is a right frontal scalp hematoma. There is preseptal soft
tissue edema and hematoma over the right eye region. No underlying
fracture.

## 2021-04-15 DIAGNOSIS — D229 Melanocytic nevi, unspecified: Secondary | ICD-10-CM | POA: Diagnosis not present

## 2021-04-15 DIAGNOSIS — L821 Other seborrheic keratosis: Secondary | ICD-10-CM | POA: Diagnosis not present

## 2021-04-15 DIAGNOSIS — L814 Other melanin hyperpigmentation: Secondary | ICD-10-CM | POA: Diagnosis not present

## 2021-04-15 DIAGNOSIS — L718 Other rosacea: Secondary | ICD-10-CM | POA: Diagnosis not present

## 2021-04-17 DIAGNOSIS — Z79899 Other long term (current) drug therapy: Secondary | ICD-10-CM | POA: Diagnosis not present

## 2021-04-17 DIAGNOSIS — Z7984 Long term (current) use of oral hypoglycemic drugs: Secondary | ICD-10-CM | POA: Diagnosis not present

## 2021-04-17 DIAGNOSIS — E7800 Pure hypercholesterolemia, unspecified: Secondary | ICD-10-CM | POA: Diagnosis not present

## 2021-04-17 DIAGNOSIS — E871 Hypo-osmolality and hyponatremia: Secondary | ICD-10-CM | POA: Diagnosis not present

## 2021-04-17 DIAGNOSIS — I10 Essential (primary) hypertension: Secondary | ICD-10-CM | POA: Diagnosis not present

## 2021-04-17 DIAGNOSIS — E1169 Type 2 diabetes mellitus with other specified complication: Secondary | ICD-10-CM | POA: Diagnosis not present

## 2021-04-17 DIAGNOSIS — F325 Major depressive disorder, single episode, in full remission: Secondary | ICD-10-CM | POA: Diagnosis not present

## 2021-04-30 DIAGNOSIS — N3020 Other chronic cystitis without hematuria: Secondary | ICD-10-CM | POA: Diagnosis not present

## 2021-05-14 ENCOUNTER — Encounter: Payer: Self-pay | Admitting: Podiatry

## 2021-05-14 ENCOUNTER — Ambulatory Visit: Payer: PPO | Admitting: Podiatry

## 2021-05-14 ENCOUNTER — Other Ambulatory Visit: Payer: Self-pay

## 2021-05-14 DIAGNOSIS — B351 Tinea unguium: Secondary | ICD-10-CM

## 2021-05-14 DIAGNOSIS — D72829 Elevated white blood cell count, unspecified: Secondary | ICD-10-CM | POA: Insufficient documentation

## 2021-05-14 DIAGNOSIS — E119 Type 2 diabetes mellitus without complications: Secondary | ICD-10-CM

## 2021-05-14 DIAGNOSIS — L608 Other nail disorders: Secondary | ICD-10-CM

## 2021-05-14 DIAGNOSIS — M79676 Pain in unspecified toe(s): Secondary | ICD-10-CM | POA: Diagnosis not present

## 2021-05-14 DIAGNOSIS — G43009 Migraine without aura, not intractable, without status migrainosus: Secondary | ICD-10-CM | POA: Insufficient documentation

## 2021-05-14 DIAGNOSIS — F411 Generalized anxiety disorder: Secondary | ICD-10-CM

## 2021-05-14 DIAGNOSIS — F325 Major depressive disorder, single episode, in full remission: Secondary | ICD-10-CM | POA: Insufficient documentation

## 2021-05-14 DIAGNOSIS — N3010 Interstitial cystitis (chronic) without hematuria: Secondary | ICD-10-CM | POA: Insufficient documentation

## 2021-05-14 DIAGNOSIS — E2839 Other primary ovarian failure: Secondary | ICD-10-CM | POA: Insufficient documentation

## 2021-05-14 DIAGNOSIS — E1169 Type 2 diabetes mellitus with other specified complication: Secondary | ICD-10-CM | POA: Insufficient documentation

## 2021-05-14 DIAGNOSIS — E1121 Type 2 diabetes mellitus with diabetic nephropathy: Secondary | ICD-10-CM

## 2021-05-14 DIAGNOSIS — F419 Anxiety disorder, unspecified: Secondary | ICD-10-CM | POA: Insufficient documentation

## 2021-05-14 DIAGNOSIS — E7800 Pure hypercholesterolemia, unspecified: Secondary | ICD-10-CM

## 2021-05-14 DIAGNOSIS — M8580 Other specified disorders of bone density and structure, unspecified site: Secondary | ICD-10-CM

## 2021-05-14 DIAGNOSIS — N952 Postmenopausal atrophic vaginitis: Secondary | ICD-10-CM

## 2021-05-14 DIAGNOSIS — N763 Subacute and chronic vulvitis: Secondary | ICD-10-CM

## 2021-05-14 DIAGNOSIS — N3941 Urge incontinence: Secondary | ICD-10-CM

## 2021-05-14 HISTORY — DX: Type 2 diabetes mellitus with diabetic nephropathy: E11.21

## 2021-05-14 HISTORY — DX: Elevated white blood cell count, unspecified: D72.829

## 2021-05-14 HISTORY — DX: Interstitial cystitis (chronic) without hematuria: N30.10

## 2021-05-14 HISTORY — DX: Major depressive disorder, single episode, in full remission: F32.5

## 2021-05-14 HISTORY — DX: Migraine without aura, not intractable, without status migrainosus: G43.009

## 2021-05-14 HISTORY — DX: Postmenopausal atrophic vaginitis: N95.2

## 2021-05-14 HISTORY — DX: Urge incontinence: N39.41

## 2021-05-14 HISTORY — DX: Subacute and chronic vulvitis: N76.3

## 2021-05-14 HISTORY — DX: Other primary ovarian failure: E28.39

## 2021-05-14 HISTORY — DX: Generalized anxiety disorder: F41.1

## 2021-05-14 HISTORY — DX: Pure hypercholesterolemia, unspecified: E78.00

## 2021-05-14 HISTORY — DX: Other specified disorders of bone density and structure, unspecified site: M85.80

## 2021-05-14 NOTE — Progress Notes (Signed)

## 2021-05-21 DIAGNOSIS — Z1231 Encounter for screening mammogram for malignant neoplasm of breast: Secondary | ICD-10-CM | POA: Diagnosis not present

## 2021-05-28 DIAGNOSIS — N3020 Other chronic cystitis without hematuria: Secondary | ICD-10-CM | POA: Diagnosis not present

## 2021-05-28 DIAGNOSIS — R8271 Bacteriuria: Secondary | ICD-10-CM | POA: Diagnosis not present

## 2021-05-29 DIAGNOSIS — E119 Type 2 diabetes mellitus without complications: Secondary | ICD-10-CM | POA: Diagnosis not present

## 2021-05-29 DIAGNOSIS — H2513 Age-related nuclear cataract, bilateral: Secondary | ICD-10-CM | POA: Diagnosis not present

## 2021-05-29 DIAGNOSIS — H524 Presbyopia: Secondary | ICD-10-CM | POA: Diagnosis not present

## 2021-06-24 DIAGNOSIS — N3020 Other chronic cystitis without hematuria: Secondary | ICD-10-CM | POA: Diagnosis not present

## 2021-07-30 ENCOUNTER — Other Ambulatory Visit: Payer: Self-pay

## 2021-07-30 ENCOUNTER — Encounter: Payer: Self-pay | Admitting: Podiatry

## 2021-07-30 ENCOUNTER — Ambulatory Visit: Payer: PPO | Admitting: Podiatry

## 2021-07-30 DIAGNOSIS — L608 Other nail disorders: Secondary | ICD-10-CM | POA: Diagnosis not present

## 2021-07-30 DIAGNOSIS — E119 Type 2 diabetes mellitus without complications: Secondary | ICD-10-CM | POA: Diagnosis not present

## 2021-07-30 DIAGNOSIS — B351 Tinea unguium: Secondary | ICD-10-CM

## 2021-07-30 DIAGNOSIS — M79676 Pain in unspecified toe(s): Secondary | ICD-10-CM | POA: Diagnosis not present

## 2021-07-30 NOTE — Progress Notes (Signed)

## 2021-08-05 DIAGNOSIS — R8271 Bacteriuria: Secondary | ICD-10-CM | POA: Diagnosis not present

## 2021-08-05 DIAGNOSIS — N3020 Other chronic cystitis without hematuria: Secondary | ICD-10-CM | POA: Diagnosis not present

## 2021-10-08 ENCOUNTER — Ambulatory Visit: Payer: PPO | Admitting: Podiatry

## 2021-10-08 ENCOUNTER — Encounter: Payer: Self-pay | Admitting: Podiatry

## 2021-10-08 ENCOUNTER — Other Ambulatory Visit: Payer: Self-pay

## 2021-10-08 DIAGNOSIS — M79676 Pain in unspecified toe(s): Secondary | ICD-10-CM | POA: Diagnosis not present

## 2021-10-08 DIAGNOSIS — L608 Other nail disorders: Secondary | ICD-10-CM | POA: Diagnosis not present

## 2021-10-08 DIAGNOSIS — B351 Tinea unguium: Secondary | ICD-10-CM

## 2021-10-08 DIAGNOSIS — E119 Type 2 diabetes mellitus without complications: Secondary | ICD-10-CM | POA: Diagnosis not present

## 2021-10-08 NOTE — Progress Notes (Signed)

## 2021-10-20 DIAGNOSIS — E1169 Type 2 diabetes mellitus with other specified complication: Secondary | ICD-10-CM | POA: Diagnosis not present

## 2021-10-20 DIAGNOSIS — E7800 Pure hypercholesterolemia, unspecified: Secondary | ICD-10-CM | POA: Diagnosis not present

## 2021-10-20 DIAGNOSIS — F325 Major depressive disorder, single episode, in full remission: Secondary | ICD-10-CM | POA: Diagnosis not present

## 2021-10-20 DIAGNOSIS — M8588 Other specified disorders of bone density and structure, other site: Secondary | ICD-10-CM | POA: Diagnosis not present

## 2021-10-20 DIAGNOSIS — Z0000 Encounter for general adult medical examination without abnormal findings: Secondary | ICD-10-CM | POA: Diagnosis not present

## 2021-10-20 DIAGNOSIS — I10 Essential (primary) hypertension: Secondary | ICD-10-CM | POA: Diagnosis not present

## 2021-10-20 DIAGNOSIS — Z1211 Encounter for screening for malignant neoplasm of colon: Secondary | ICD-10-CM | POA: Diagnosis not present

## 2021-10-20 DIAGNOSIS — Z7984 Long term (current) use of oral hypoglycemic drugs: Secondary | ICD-10-CM | POA: Diagnosis not present

## 2021-12-31 ENCOUNTER — Ambulatory Visit: Payer: PPO | Admitting: Podiatry

## 2022-01-01 DIAGNOSIS — L814 Other melanin hyperpigmentation: Secondary | ICD-10-CM | POA: Diagnosis not present

## 2022-01-01 DIAGNOSIS — D492 Neoplasm of unspecified behavior of bone, soft tissue, and skin: Secondary | ICD-10-CM | POA: Diagnosis not present

## 2022-01-01 DIAGNOSIS — D225 Melanocytic nevi of trunk: Secondary | ICD-10-CM | POA: Diagnosis not present

## 2022-01-01 DIAGNOSIS — L821 Other seborrheic keratosis: Secondary | ICD-10-CM | POA: Diagnosis not present

## 2022-01-01 DIAGNOSIS — C44319 Basal cell carcinoma of skin of other parts of face: Secondary | ICD-10-CM | POA: Diagnosis not present

## 2022-01-01 DIAGNOSIS — Z08 Encounter for follow-up examination after completed treatment for malignant neoplasm: Secondary | ICD-10-CM | POA: Diagnosis not present

## 2022-01-01 DIAGNOSIS — Z85828 Personal history of other malignant neoplasm of skin: Secondary | ICD-10-CM | POA: Diagnosis not present

## 2022-01-01 DIAGNOSIS — R208 Other disturbances of skin sensation: Secondary | ICD-10-CM | POA: Diagnosis not present

## 2022-01-05 ENCOUNTER — Ambulatory Visit: Payer: PPO | Admitting: Podiatry

## 2022-01-05 ENCOUNTER — Encounter: Payer: Self-pay | Admitting: Podiatry

## 2022-01-05 ENCOUNTER — Other Ambulatory Visit: Payer: Self-pay

## 2022-01-05 DIAGNOSIS — M79676 Pain in unspecified toe(s): Secondary | ICD-10-CM

## 2022-01-05 DIAGNOSIS — L608 Other nail disorders: Secondary | ICD-10-CM | POA: Diagnosis not present

## 2022-01-05 DIAGNOSIS — B351 Tinea unguium: Secondary | ICD-10-CM | POA: Diagnosis not present

## 2022-01-05 DIAGNOSIS — E119 Type 2 diabetes mellitus without complications: Secondary | ICD-10-CM | POA: Diagnosis not present

## 2022-01-05 NOTE — Progress Notes (Signed)

## 2022-03-16 ENCOUNTER — Ambulatory Visit: Payer: PPO | Admitting: Podiatry

## 2022-03-16 ENCOUNTER — Encounter: Payer: Self-pay | Admitting: Podiatry

## 2022-03-16 DIAGNOSIS — E119 Type 2 diabetes mellitus without complications: Secondary | ICD-10-CM

## 2022-03-16 DIAGNOSIS — L608 Other nail disorders: Secondary | ICD-10-CM

## 2022-03-16 DIAGNOSIS — M79676 Pain in unspecified toe(s): Secondary | ICD-10-CM | POA: Diagnosis not present

## 2022-03-16 DIAGNOSIS — B351 Tinea unguium: Secondary | ICD-10-CM

## 2022-03-16 NOTE — Progress Notes (Signed)

## 2022-03-26 DIAGNOSIS — C006 Malignant neoplasm of commissure of lip, unspecified: Secondary | ICD-10-CM | POA: Diagnosis not present

## 2022-04-20 DIAGNOSIS — M8588 Other specified disorders of bone density and structure, other site: Secondary | ICD-10-CM | POA: Diagnosis not present

## 2022-04-20 DIAGNOSIS — E2839 Other primary ovarian failure: Secondary | ICD-10-CM | POA: Diagnosis not present

## 2022-04-20 DIAGNOSIS — E7800 Pure hypercholesterolemia, unspecified: Secondary | ICD-10-CM | POA: Diagnosis not present

## 2022-04-20 DIAGNOSIS — E1169 Type 2 diabetes mellitus with other specified complication: Secondary | ICD-10-CM | POA: Diagnosis not present

## 2022-04-20 DIAGNOSIS — I10 Essential (primary) hypertension: Secondary | ICD-10-CM | POA: Diagnosis not present

## 2022-04-20 DIAGNOSIS — F325 Major depressive disorder, single episode, in full remission: Secondary | ICD-10-CM | POA: Diagnosis not present

## 2022-04-29 DIAGNOSIS — Z01419 Encounter for gynecological examination (general) (routine) without abnormal findings: Secondary | ICD-10-CM | POA: Diagnosis not present

## 2022-04-29 DIAGNOSIS — N952 Postmenopausal atrophic vaginitis: Secondary | ICD-10-CM | POA: Diagnosis not present

## 2022-05-01 DIAGNOSIS — Z85828 Personal history of other malignant neoplasm of skin: Secondary | ICD-10-CM | POA: Diagnosis not present

## 2022-05-01 DIAGNOSIS — L578 Other skin changes due to chronic exposure to nonionizing radiation: Secondary | ICD-10-CM | POA: Diagnosis not present

## 2022-05-01 DIAGNOSIS — Z08 Encounter for follow-up examination after completed treatment for malignant neoplasm: Secondary | ICD-10-CM | POA: Diagnosis not present

## 2022-05-18 ENCOUNTER — Other Ambulatory Visit: Payer: Self-pay | Admitting: Internal Medicine

## 2022-05-18 DIAGNOSIS — E2839 Other primary ovarian failure: Secondary | ICD-10-CM

## 2022-05-25 DIAGNOSIS — J0100 Acute maxillary sinusitis, unspecified: Secondary | ICD-10-CM | POA: Diagnosis not present

## 2022-05-27 DIAGNOSIS — Z1231 Encounter for screening mammogram for malignant neoplasm of breast: Secondary | ICD-10-CM | POA: Diagnosis not present

## 2022-06-03 ENCOUNTER — Ambulatory Visit: Payer: PPO | Admitting: Podiatry

## 2022-06-03 ENCOUNTER — Encounter: Payer: Self-pay | Admitting: Podiatry

## 2022-06-03 DIAGNOSIS — B351 Tinea unguium: Secondary | ICD-10-CM | POA: Diagnosis not present

## 2022-06-03 DIAGNOSIS — L608 Other nail disorders: Secondary | ICD-10-CM

## 2022-06-03 DIAGNOSIS — E119 Type 2 diabetes mellitus without complications: Secondary | ICD-10-CM

## 2022-06-03 DIAGNOSIS — M79676 Pain in unspecified toe(s): Secondary | ICD-10-CM | POA: Diagnosis not present

## 2022-06-03 NOTE — Progress Notes (Signed)

## 2022-06-09 DIAGNOSIS — R928 Other abnormal and inconclusive findings on diagnostic imaging of breast: Secondary | ICD-10-CM | POA: Diagnosis not present

## 2022-06-09 DIAGNOSIS — N6323 Unspecified lump in the left breast, lower outer quadrant: Secondary | ICD-10-CM | POA: Diagnosis not present

## 2022-06-09 DIAGNOSIS — N6321 Unspecified lump in the left breast, upper outer quadrant: Secondary | ICD-10-CM | POA: Diagnosis not present

## 2022-06-17 DIAGNOSIS — N6321 Unspecified lump in the left breast, upper outer quadrant: Secondary | ICD-10-CM | POA: Diagnosis not present

## 2022-06-17 DIAGNOSIS — N6032 Fibrosclerosis of left breast: Secondary | ICD-10-CM | POA: Diagnosis not present

## 2022-06-19 DIAGNOSIS — N3020 Other chronic cystitis without hematuria: Secondary | ICD-10-CM | POA: Diagnosis not present

## 2022-06-19 DIAGNOSIS — N3941 Urge incontinence: Secondary | ICD-10-CM | POA: Diagnosis not present

## 2022-06-25 DIAGNOSIS — E119 Type 2 diabetes mellitus without complications: Secondary | ICD-10-CM | POA: Diagnosis not present

## 2022-06-25 DIAGNOSIS — H2513 Age-related nuclear cataract, bilateral: Secondary | ICD-10-CM | POA: Diagnosis not present

## 2022-06-25 DIAGNOSIS — H5203 Hypermetropia, bilateral: Secondary | ICD-10-CM | POA: Diagnosis not present

## 2022-08-04 ENCOUNTER — Ambulatory Visit: Payer: PPO | Admitting: Podiatry

## 2022-08-12 ENCOUNTER — Ambulatory Visit: Payer: PPO | Admitting: Podiatry

## 2022-08-13 DIAGNOSIS — R7989 Other specified abnormal findings of blood chemistry: Secondary | ICD-10-CM | POA: Diagnosis not present

## 2022-08-19 ENCOUNTER — Ambulatory Visit: Payer: PPO | Admitting: Podiatry

## 2022-08-20 DIAGNOSIS — E871 Hypo-osmolality and hyponatremia: Secondary | ICD-10-CM | POA: Diagnosis not present

## 2022-08-31 DIAGNOSIS — D225 Melanocytic nevi of trunk: Secondary | ICD-10-CM | POA: Diagnosis not present

## 2022-08-31 DIAGNOSIS — Z85828 Personal history of other malignant neoplasm of skin: Secondary | ICD-10-CM | POA: Diagnosis not present

## 2022-08-31 DIAGNOSIS — L814 Other melanin hyperpigmentation: Secondary | ICD-10-CM | POA: Diagnosis not present

## 2022-08-31 DIAGNOSIS — Z08 Encounter for follow-up examination after completed treatment for malignant neoplasm: Secondary | ICD-10-CM | POA: Diagnosis not present

## 2022-08-31 DIAGNOSIS — L821 Other seborrheic keratosis: Secondary | ICD-10-CM | POA: Diagnosis not present

## 2022-09-04 ENCOUNTER — Ambulatory Visit: Payer: PPO | Admitting: Podiatry

## 2022-09-04 DIAGNOSIS — E119 Type 2 diabetes mellitus without complications: Secondary | ICD-10-CM

## 2022-09-04 DIAGNOSIS — L608 Other nail disorders: Secondary | ICD-10-CM

## 2022-09-04 DIAGNOSIS — M79676 Pain in unspecified toe(s): Secondary | ICD-10-CM

## 2022-09-04 DIAGNOSIS — B351 Tinea unguium: Secondary | ICD-10-CM

## 2022-09-04 NOTE — Progress Notes (Signed)

## 2022-10-30 ENCOUNTER — Inpatient Hospital Stay: Admission: RE | Admit: 2022-10-30 | Payer: PPO | Source: Ambulatory Visit

## 2022-11-02 DIAGNOSIS — I10 Essential (primary) hypertension: Secondary | ICD-10-CM | POA: Diagnosis not present

## 2022-11-02 DIAGNOSIS — M8588 Other specified disorders of bone density and structure, other site: Secondary | ICD-10-CM | POA: Diagnosis not present

## 2022-11-02 DIAGNOSIS — Z0000 Encounter for general adult medical examination without abnormal findings: Secondary | ICD-10-CM | POA: Diagnosis not present

## 2022-11-02 DIAGNOSIS — Z1211 Encounter for screening for malignant neoplasm of colon: Secondary | ICD-10-CM | POA: Diagnosis not present

## 2022-11-02 DIAGNOSIS — E7800 Pure hypercholesterolemia, unspecified: Secondary | ICD-10-CM | POA: Diagnosis not present

## 2022-11-02 DIAGNOSIS — E1169 Type 2 diabetes mellitus with other specified complication: Secondary | ICD-10-CM | POA: Diagnosis not present

## 2022-11-02 DIAGNOSIS — F325 Major depressive disorder, single episode, in full remission: Secondary | ICD-10-CM | POA: Diagnosis not present

## 2022-11-06 DIAGNOSIS — Z1211 Encounter for screening for malignant neoplasm of colon: Secondary | ICD-10-CM | POA: Diagnosis not present

## 2022-11-24 ENCOUNTER — Encounter: Payer: Self-pay | Admitting: Podiatry

## 2022-11-24 ENCOUNTER — Ambulatory Visit (INDEPENDENT_AMBULATORY_CARE_PROVIDER_SITE_OTHER): Payer: PPO | Admitting: Podiatry

## 2022-11-24 DIAGNOSIS — L608 Other nail disorders: Secondary | ICD-10-CM | POA: Diagnosis not present

## 2022-11-24 DIAGNOSIS — B351 Tinea unguium: Secondary | ICD-10-CM

## 2022-11-24 DIAGNOSIS — M79676 Pain in unspecified toe(s): Secondary | ICD-10-CM | POA: Diagnosis not present

## 2022-11-24 DIAGNOSIS — E119 Type 2 diabetes mellitus without complications: Secondary | ICD-10-CM | POA: Diagnosis not present

## 2022-11-24 NOTE — Progress Notes (Signed)

## 2022-12-23 DIAGNOSIS — N6082 Other benign mammary dysplasias of left breast: Secondary | ICD-10-CM | POA: Diagnosis not present

## 2023-01-12 DIAGNOSIS — D225 Melanocytic nevi of trunk: Secondary | ICD-10-CM | POA: Diagnosis not present

## 2023-01-12 DIAGNOSIS — Z08 Encounter for follow-up examination after completed treatment for malignant neoplasm: Secondary | ICD-10-CM | POA: Diagnosis not present

## 2023-01-12 DIAGNOSIS — L570 Actinic keratosis: Secondary | ICD-10-CM | POA: Diagnosis not present

## 2023-01-12 DIAGNOSIS — Z85828 Personal history of other malignant neoplasm of skin: Secondary | ICD-10-CM | POA: Diagnosis not present

## 2023-01-12 DIAGNOSIS — L814 Other melanin hyperpigmentation: Secondary | ICD-10-CM | POA: Diagnosis not present

## 2023-01-12 DIAGNOSIS — R233 Spontaneous ecchymoses: Secondary | ICD-10-CM | POA: Diagnosis not present

## 2023-01-12 DIAGNOSIS — L821 Other seborrheic keratosis: Secondary | ICD-10-CM | POA: Diagnosis not present

## 2023-02-02 ENCOUNTER — Ambulatory Visit: Payer: PPO | Admitting: Podiatry

## 2023-03-02 ENCOUNTER — Ambulatory Visit (INDEPENDENT_AMBULATORY_CARE_PROVIDER_SITE_OTHER): Payer: PPO | Admitting: Podiatry

## 2023-03-02 ENCOUNTER — Encounter: Payer: Self-pay | Admitting: Podiatry

## 2023-03-02 DIAGNOSIS — M79676 Pain in unspecified toe(s): Secondary | ICD-10-CM

## 2023-03-02 DIAGNOSIS — E119 Type 2 diabetes mellitus without complications: Secondary | ICD-10-CM

## 2023-03-02 DIAGNOSIS — B351 Tinea unguium: Secondary | ICD-10-CM | POA: Diagnosis not present

## 2023-03-02 NOTE — Progress Notes (Signed)

## 2023-03-18 ENCOUNTER — Observation Stay (HOSPITAL_COMMUNITY): Payer: PPO

## 2023-03-18 ENCOUNTER — Emergency Department (HOSPITAL_COMMUNITY): Payer: PPO

## 2023-03-18 ENCOUNTER — Other Ambulatory Visit: Payer: Self-pay

## 2023-03-18 ENCOUNTER — Inpatient Hospital Stay (HOSPITAL_COMMUNITY)
Admission: EM | Admit: 2023-03-18 | Discharge: 2023-03-22 | DRG: 640 | Disposition: A | Discharge status: Home / Self Care | Payer: PPO | Attending: Family Medicine | Admitting: Family Medicine

## 2023-03-18 ENCOUNTER — Encounter (HOSPITAL_COMMUNITY): Payer: Self-pay | Admitting: *Deleted

## 2023-03-18 DIAGNOSIS — Z882 Allergy status to sulfonamides status: Secondary | ICD-10-CM | POA: Diagnosis not present

## 2023-03-18 DIAGNOSIS — Z8249 Family history of ischemic heart disease and other diseases of the circulatory system: Secondary | ICD-10-CM

## 2023-03-18 DIAGNOSIS — Z888 Allergy status to other drugs, medicaments and biological substances status: Secondary | ICD-10-CM | POA: Diagnosis not present

## 2023-03-18 DIAGNOSIS — R9431 Abnormal electrocardiogram [ECG] [EKG]: Secondary | ICD-10-CM | POA: Diagnosis not present

## 2023-03-18 DIAGNOSIS — G9341 Metabolic encephalopathy: Secondary | ICD-10-CM | POA: Diagnosis not present

## 2023-03-18 DIAGNOSIS — I160 Hypertensive urgency: Secondary | ICD-10-CM

## 2023-03-18 DIAGNOSIS — G9340 Encephalopathy, unspecified: Secondary | ICD-10-CM | POA: Diagnosis present

## 2023-03-18 DIAGNOSIS — F32A Depression, unspecified: Secondary | ICD-10-CM | POA: Diagnosis not present

## 2023-03-18 DIAGNOSIS — Z7984 Long term (current) use of oral hypoglycemic drugs: Secondary | ICD-10-CM

## 2023-03-18 DIAGNOSIS — I161 Hypertensive emergency: Secondary | ICD-10-CM | POA: Diagnosis not present

## 2023-03-18 DIAGNOSIS — E876 Hypokalemia: Secondary | ICD-10-CM | POA: Diagnosis not present

## 2023-03-18 DIAGNOSIS — Z90710 Acquired absence of both cervix and uterus: Secondary | ICD-10-CM | POA: Diagnosis not present

## 2023-03-18 DIAGNOSIS — R5381 Other malaise: Secondary | ICD-10-CM

## 2023-03-18 DIAGNOSIS — E1169 Type 2 diabetes mellitus with other specified complication: Secondary | ICD-10-CM | POA: Diagnosis not present

## 2023-03-18 DIAGNOSIS — Z79899 Other long term (current) drug therapy: Secondary | ICD-10-CM | POA: Diagnosis not present

## 2023-03-18 DIAGNOSIS — Z87891 Personal history of nicotine dependence: Secondary | ICD-10-CM | POA: Diagnosis not present

## 2023-03-18 DIAGNOSIS — E871 Hypo-osmolality and hyponatremia: Secondary | ICD-10-CM | POA: Diagnosis not present

## 2023-03-18 DIAGNOSIS — R4182 Altered mental status, unspecified: Secondary | ICD-10-CM | POA: Diagnosis not present

## 2023-03-18 DIAGNOSIS — I169 Hypertensive crisis, unspecified: Secondary | ICD-10-CM | POA: Diagnosis not present

## 2023-03-18 DIAGNOSIS — R4189 Other symptoms and signs involving cognitive functions and awareness: Secondary | ICD-10-CM

## 2023-03-18 DIAGNOSIS — I10 Essential (primary) hypertension: Secondary | ICD-10-CM | POA: Diagnosis present

## 2023-03-18 DIAGNOSIS — R008 Other abnormalities of heart beat: Secondary | ICD-10-CM | POA: Diagnosis not present

## 2023-03-18 HISTORY — DX: Hypertensive crisis, unspecified: I16.9

## 2023-03-18 HISTORY — DX: Hypertensive urgency: I16.0

## 2023-03-18 HISTORY — DX: Hypomagnesemia: E83.42

## 2023-03-18 HISTORY — DX: Abnormal electrocardiogram (ECG) (EKG): R94.31

## 2023-03-18 HISTORY — DX: Metabolic encephalopathy: G93.41

## 2023-03-18 LAB — CBC WITH DIFFERENTIAL/PLATELET
Abs Immature Granulocytes: 0.04 10*3/uL (ref 0.00–0.07)
Basophils Absolute: 0.1 10*3/uL (ref 0.0–0.1)
Basophils Relative: 1 %
Eosinophils Absolute: 0.1 10*3/uL (ref 0.0–0.5)
Eosinophils Relative: 1 %
HCT: 34.2 % — ABNORMAL LOW (ref 36.0–46.0)
Hemoglobin: 12.2 g/dL (ref 12.0–15.0)
Immature Granulocytes: 0 %
Lymphocytes Relative: 12 %
Lymphs Abs: 1.2 10*3/uL (ref 0.7–4.0)
MCH: 29.5 pg (ref 26.0–34.0)
MCHC: 35.7 g/dL (ref 30.0–36.0)
MCV: 82.8 fL (ref 80.0–100.0)
Monocytes Absolute: 0.8 10*3/uL (ref 0.1–1.0)
Monocytes Relative: 8 %
Neutro Abs: 7.6 10*3/uL (ref 1.7–7.7)
Neutrophils Relative %: 78 %
Platelets: 265 10*3/uL (ref 150–400)
RBC: 4.13 MIL/uL (ref 3.87–5.11)
RDW: 13.1 % (ref 11.5–15.5)
WBC: 9.8 10*3/uL (ref 4.0–10.5)
nRBC: 0 % (ref 0.0–0.2)

## 2023-03-18 LAB — COMPREHENSIVE METABOLIC PANEL
ALT: 20 U/L (ref 0–44)
AST: 29 U/L (ref 15–41)
Albumin: 4.5 g/dL (ref 3.5–5.0)
Alkaline Phosphatase: 51 U/L (ref 38–126)
Anion gap: 13 (ref 5–15)
BUN: 13 mg/dL (ref 8–23)
CO2: 23 mmol/L (ref 22–32)
Calcium: 8.9 mg/dL (ref 8.9–10.3)
Chloride: 90 mmol/L — ABNORMAL LOW (ref 98–111)
Creatinine, Ser: 0.81 mg/dL (ref 0.44–1.00)
GFR, Estimated: >60 mL/min (ref >60)
Glucose, Bld: 139 mg/dL — ABNORMAL HIGH (ref 70–99)
Potassium: 2.9 mmol/L — ABNORMAL LOW (ref 3.5–5.1)
Sodium: 126 mmol/L — ABNORMAL LOW (ref 135–145)
Total Bilirubin: 1.2 mg/dL (ref 0.3–1.2)
Total Protein: 7.8 g/dL (ref 6.5–8.1)

## 2023-03-18 LAB — GLUCOSE, CAPILLARY
Glucose-Capillary: 150 mg/dL — ABNORMAL HIGH (ref 70–99)
Glucose-Capillary: 218 mg/dL — ABNORMAL HIGH (ref 70–99)
Glucose-Capillary: 151 mg/dL — ABNORMAL HIGH (ref 70–99)
Glucose-Capillary: 200 mg/dL — ABNORMAL HIGH (ref 70–99)

## 2023-03-18 LAB — URINALYSIS, W/ REFLEX TO CULTURE (INFECTION SUSPECTED)
Bacteria, UA: NONE SEEN
Bilirubin Urine: NEGATIVE
Glucose, UA: NEGATIVE mg/dL
Hgb urine dipstick: NEGATIVE
Ketones, ur: 5 mg/dL — AB
Leukocytes,Ua: NEGATIVE
Nitrite: NEGATIVE
Protein, ur: 100 mg/dL — AB
Specific Gravity, Urine: 1.003 — ABNORMAL LOW (ref 1.005–1.030)
pH: 7 (ref 5.0–8.0)

## 2023-03-18 LAB — BASIC METABOLIC PANEL
Anion gap: 9 (ref 5–15)
BUN: 11 mg/dL (ref 8–23)
CO2: 21 mmol/L — ABNORMAL LOW (ref 22–32)
Calcium: 8.9 mg/dL (ref 8.9–10.3)
Chloride: 94 mmol/L — ABNORMAL LOW (ref 98–111)
Creatinine, Ser: 0.8 mg/dL (ref 0.44–1.00)
GFR, Estimated: >60 mL/min (ref >60)
Glucose, Bld: 157 mg/dL — ABNORMAL HIGH (ref 70–99)
Potassium: 3.6 mmol/L (ref 3.5–5.1)
Sodium: 124 mmol/L — ABNORMAL LOW (ref 135–145)

## 2023-03-18 LAB — SODIUM, URINE, RANDOM: Sodium, Ur: 26 mmol/L

## 2023-03-18 LAB — OSMOLALITY, URINE: Osmolality, Ur: 134 mOsm/kg — ABNORMAL LOW (ref 300–900)

## 2023-03-18 LAB — PROTIME-INR
INR: 1 (ref 0.8–1.2)
Prothrombin Time: 13.3 seconds (ref 11.4–15.2)

## 2023-03-18 LAB — MAGNESIUM: Magnesium: 1.6 mg/dL — ABNORMAL LOW (ref 1.7–2.4)

## 2023-03-18 LAB — OSMOLALITY: Osmolality: 272 mOsm/kg — ABNORMAL LOW (ref 275–295)

## 2023-03-18 LAB — BRAIN NATRIURETIC PEPTIDE: B Natriuretic Peptide: 87.9 pg/mL (ref 0.0–100.0)

## 2023-03-18 MED ORDER — ALUM & MAG HYDROXIDE-SIMETH 200-200-20 MG/5ML PO SUSP
15.0000 mL | Freq: Four times a day (QID) | ORAL | Status: DC | PRN
  Filled 2023-03-18: qty 30

## 2023-03-18 MED ORDER — POTASSIUM CHLORIDE CRYS ER 20 MEQ PO TBCR
40.0000 meq | EXTENDED_RELEASE_TABLET | Freq: Once | ORAL | Status: AC
  Administered 2023-03-18: 40 meq via ORAL
  Filled 2023-03-18: qty 2

## 2023-03-18 MED ORDER — MAGNESIUM SULFATE 2 GM/50ML IV SOLN
2.0000 g | Freq: Once | INTRAVENOUS | Status: AC
  Administered 2023-03-18: 2 g via INTRAVENOUS
  Filled 2023-03-18: qty 50

## 2023-03-18 MED ORDER — INSULIN ASPART 100 UNIT/ML IJ SOLN
0.0000 [IU] | Freq: Every day | INTRAMUSCULAR | Status: DC
  Administered 2023-03-19: 2 [IU] via SUBCUTANEOUS
  Administered 2023-03-21: 3 [IU] via SUBCUTANEOUS

## 2023-03-18 MED ORDER — SENNOSIDES-DOCUSATE SODIUM 8.6-50 MG PO TABS: 1.0000 | ORAL_TABLET | Freq: Every evening | ORAL | Status: DC | PRN

## 2023-03-18 MED ORDER — SODIUM CHLORIDE 0.9% FLUSH
3.0000 mL | Freq: Two times a day (BID) | INTRAVENOUS | Status: DC
  Administered 2023-03-18 – 2023-03-22 (×8): 3 mL via INTRAVENOUS

## 2023-03-18 MED ORDER — ROSUVASTATIN CALCIUM 5 MG PO TABS
2.5000 mg | ORAL_TABLET | Freq: Every day | ORAL | Status: DC
  Administered 2023-03-18 – 2023-03-21 (×4): 2.5 mg via ORAL
  Filled 2023-03-18 (×4): qty 1

## 2023-03-18 MED ORDER — AMLODIPINE BESYLATE 10 MG PO TABS
10.0000 mg | ORAL_TABLET | Freq: Every day | ORAL | Status: DC
  Administered 2023-03-19 – 2023-03-22 (×4): 10 mg via ORAL
  Filled 2023-03-18 (×4): qty 1

## 2023-03-18 MED ORDER — INSULIN ASPART 100 UNIT/ML IJ SOLN
0.0000 [IU] | Freq: Three times a day (TID) | INTRAMUSCULAR | Status: DC
  Administered 2023-03-18: 2 [IU] via SUBCUTANEOUS
  Administered 2023-03-19 – 2023-03-20 (×2): 1 [IU] via SUBCUTANEOUS
  Administered 2023-03-20: 4 [IU] via SUBCUTANEOUS
  Administered 2023-03-22: 2 [IU] via SUBCUTANEOUS

## 2023-03-18 MED ORDER — LABETALOL HCL 5 MG/ML IV SOLN
10.0000 mg | INTRAVENOUS | Status: DC | PRN
  Administered 2023-03-18 – 2023-03-21 (×2): 10 mg via INTRAVENOUS
  Filled 2023-03-18 (×2): qty 4

## 2023-03-18 MED ORDER — LORAZEPAM 2 MG/ML IJ SOLN: 0.5000 mg | Freq: Once | INTRAMUSCULAR | Status: DC | PRN

## 2023-03-18 MED ORDER — ACETAMINOPHEN 325 MG PO TABS
650.0000 mg | ORAL_TABLET | Freq: Four times a day (QID) | ORAL | Status: DC | PRN
  Administered 2023-03-18 – 2023-03-22 (×4): 650 mg via ORAL
  Filled 2023-03-18 (×4): qty 2

## 2023-03-18 MED ORDER — AMLODIPINE BESYLATE 10 MG PO TABS
5.0000 mg | ORAL_TABLET | Freq: Once | ORAL | Status: AC
  Administered 2023-03-18: 5 mg via ORAL

## 2023-03-18 MED ORDER — PANTOPRAZOLE SODIUM 40 MG PO TBEC
40.0000 mg | DELAYED_RELEASE_TABLET | Freq: Every day | ORAL | Status: DC
  Administered 2023-03-18 – 2023-03-22 (×5): 40 mg via ORAL
  Filled 2023-03-18 (×5): qty 1

## 2023-03-18 MED ORDER — AMLODIPINE BESYLATE 5 MG PO TABS
5.0000 mg | ORAL_TABLET | Freq: Once | ORAL | Status: AC
  Administered 2023-03-18: 5 mg via ORAL
  Filled 2023-03-18: qty 1

## 2023-03-18 MED ORDER — ACETAMINOPHEN 650 MG RE SUPP: 650.0000 mg | Freq: Four times a day (QID) | RECTAL | Status: DC | PRN

## 2023-03-18 MED ORDER — AMLODIPINE BESYLATE 5 MG PO TABS
5.0000 mg | ORAL_TABLET | Freq: Every day | ORAL | Status: DC
  Filled 2023-03-18: qty 1

## 2023-03-18 MED ORDER — POTASSIUM CHLORIDE 10 MEQ/100ML IV SOLN
10.0000 meq | INTRAVENOUS | Status: AC
  Administered 2023-03-18 (×3): 10 meq via INTRAVENOUS
  Filled 2023-03-18 (×3): qty 100

## 2023-03-18 MED ORDER — SODIUM CHLORIDE 0.9 % IV SOLN: INTRAVENOUS | Status: DC

## 2023-03-18 MED ORDER — POTASSIUM CHLORIDE CRYS ER 20 MEQ PO TBCR
20.0000 meq | EXTENDED_RELEASE_TABLET | Freq: Once | ORAL | Status: AC
  Administered 2023-03-18: 20 meq via ORAL
  Filled 2023-03-18: qty 1

## 2023-03-18 MED ORDER — IRBESARTAN 300 MG PO TABS
300.0000 mg | ORAL_TABLET | Freq: Every day | ORAL | Status: DC
  Administered 2023-03-18 – 2023-03-22 (×5): 300 mg via ORAL
  Filled 2023-03-18 (×5): qty 1

## 2023-03-18 MED ORDER — LABETALOL HCL 5 MG/ML IV SOLN
20.0000 mg | INTRAVENOUS | Status: DC | PRN
  Administered 2023-03-18 (×2): 20 mg via INTRAVENOUS
  Filled 2023-03-18 (×2): qty 4

## 2023-03-18 MED ORDER — ENOXAPARIN SODIUM 40 MG/0.4ML IJ SOSY
40.0000 mg | PREFILLED_SYRINGE | INTRAMUSCULAR | Status: DC
  Administered 2023-03-18 – 2023-03-22 (×5): 40 mg via SUBCUTANEOUS
  Filled 2023-03-18 (×5): qty 0.4

## 2023-03-18 MED ORDER — METOPROLOL TARTRATE 25 MG PO TABS
25.0000 mg | ORAL_TABLET | Freq: Two times a day (BID) | ORAL | Status: DC
  Administered 2023-03-18 – 2023-03-22 (×9): 25 mg via ORAL
  Filled 2023-03-18 (×9): qty 1

## 2023-03-18 MED ORDER — LACTATED RINGERS IV SOLN: INTRAVENOUS | Status: DC

## 2023-03-18 MED ORDER — LABETALOL HCL 5 MG/ML IV SOLN
20.0000 mg | Freq: Once | INTRAVENOUS | Status: AC
  Administered 2023-03-18: 20 mg via INTRAVENOUS
  Filled 2023-03-18: qty 4

## 2023-03-18 MED ORDER — HYDRALAZINE HCL 25 MG PO TABS
25.0000 mg | ORAL_TABLET | Freq: Three times a day (TID) | ORAL | Status: DC
  Administered 2023-03-18 – 2023-03-21 (×8): 25 mg via ORAL
  Filled 2023-03-18 (×8): qty 1

## 2023-03-18 NOTE — ED Triage Notes (Signed)
Pt had a UTI a month ago, had been taking cefalexin from previous prescription. Improvement of UTI symptoms however now having issues controlling blood pressure. Has been off antihypertensives for the past 2 days due to busy scheduling. Denies headache or dizziness. Reports having worsening balance issues

## 2023-03-18 NOTE — ED Notes (Signed)
ED TO INPATIENT HANDOFF REPORT  ED Nurse Name and Phone #:   S Name/Age/Gender Cindy Rhodes 77 y.o. female Room/Bed: WA25/WA25  Code Status   Code Status: Prior  Home/SNF/Other Home Patient oriented to: self, place, time, and situation Is this baseline? Yes   Triage Complete: Triage complete  Chief Complaint Hypertensive crisis [I16.9]  Triage Note Pt had a UTI a month ago, had been taking cefalexin from previous prescription. Improvement of UTI symptoms however now having issues controlling blood pressure. Has been off antihypertensives for the past 2 days due to busy scheduling. Denies headache or dizziness. Reports having worsening balance issues    Allergies Allergies  Allergen Reactions   Hydrochlorothiazide Other (See Comments)   Meloxicam Other (See Comments)    Dizzy and nervous  Other reaction(s): Other (See Comments) Dizzy and nervous    Other    Sulfa Antibiotics Other (See Comments)    Shuts down urinary system Shuts down urinary system    Level of Care/Admitting Diagnosis ED Disposition     ED Disposition  Admit   Condition  --   Comment  Hospital Area: [facility] [[id number]]  Level of Care: Progressive [102]  Admit to Progressive based on following criteria: MULTISYSTEM THREATS such as stable sepsis, metabolic/electrolyte imbalance with or without encephalopathy that is responding to early treatment.  May place patient in observation at [facility] or [facility] if equivalent level of care is available:: Yes  Covid Evaluation: Asymptomatic - no recent exposure (last 10 days) testing not required  Diagnosis: Hypertensive crisis [161096]  Admitting Physician: Briscoe Deutscher [[id number]]  Attending Physician: Briscoe Deutscher [[id number]]          B Medical/Surgery History Past Medical History:  Diagnosis Date   Depression    Diabetes mellitus without complication    Hypertension    Past Surgical History:  Procedure  Laterality Date   ABDOMINAL HYSTERECTOMY     HAND SURGERY Left      A IV Location/Drains/Wounds Patient Lines/Drains/Airways Status     Active Line/Drains/Airways     Name Placement date Placement time Site Days   Peripheral IV 03/18/23 20 G Left Antecubital 03/18/23  0303  Antecubital  less than 1            Intake/Output Last 24 hours No intake or output data in the 24 hours ending 03/18/23 0522  Labs/Imaging Results for orders placed or performed during the hospital encounter of 03/18/23 (from the past 48 hour(s))  Urinalysis, w/ Reflex to Culture (Infection Suspected) -Urine, Clean Catch     Status: Abnormal   Collection Time: 03/18/23  2:55 AM  Result Value Ref Range   Specimen Source URINE, CLEAN CATCH    Color, Urine COLORLESS (A) YELLOW   APPearance CLEAR CLEAR   Specific Gravity, Urine 1.003 (L) 1.005 - 1.030   pH 7.0 5.0 - 8.0   Glucose, UA NEGATIVE NEGATIVE mg/dL   Hgb urine dipstick NEGATIVE NEGATIVE   Bilirubin Urine NEGATIVE NEGATIVE   Ketones, ur 5 (A) NEGATIVE mg/dL   Protein, ur 295 (A) NEGATIVE mg/dL   Nitrite NEGATIVE NEGATIVE   Leukocytes,Ua NEGATIVE NEGATIVE   RBC / HPF 0-5 0 - 5 RBC/hpf   WBC, UA 0-5 0 - 5 WBC/hpf    Comment:        Reflex urine culture not performed if WBC <=10, OR if Squamous epithelial cells >5. If Squamous epithelial cells >5 suggest recollection.    Bacteria, UA  NONE SEEN NONE SEEN   Squamous Epithelial / HPF 0-5 0 - 5 /HPF    Comment: Performed at [facility], [address], [place], [place] [address]  Comprehensive metabolic panel     Status: Abnormal   Collection Time: 03/18/23  3:00 AM  Result Value Ref Range   Sodium 126 (L) 135 - 145 mmol/L   Potassium 2.9 (L) 3.5 - 5.1 mmol/L   Chloride 90 (L) 98 - 111 mmol/L   CO2 23 22 - 32 mmol/L   Glucose, Bld 139 (H) 70 - 99 mg/dL    Comment: Glucose reference range applies only to samples taken after fasting for at least 8 hours.   BUN 13 8 - 23  mg/dL   Creatinine, Ser 6.04 0.44 - 1.00 mg/dL   Calcium 8.9 8.9 - 54.0 mg/dL   Total Protein 7.8 6.5 - 8.1 g/dL   Albumin 4.5 3.5 - 5.0 g/dL   AST 29 15 - 41 U/L   ALT 20 0 - 44 U/L   Alkaline Phosphatase 51 38 - 126 U/L   Total Bilirubin 1.2 0.3 - 1.2 mg/dL   GFR, Estimated >98 >11 mL/min    Comment: (NOTE) Calculated using the CKD-EPI Creatinine Equation (2021)    Anion gap 13 5 - 15    Comment: Performed at [facility], [address], [place], [place] [address]  CBC with Differential     Status: Abnormal   Collection Time: 03/18/23  3:00 AM  Result Value Ref Range   WBC 9.8 4.0 - 10.5 K/uL   RBC 4.13 3.87 - 5.11 MIL/uL   Hemoglobin 12.2 12.0 - 15.0 g/dL   HCT 29.5 (L) 62.1 - 30.8 %   MCV 82.8 80.0 - 100.0 fL   MCH 29.5 26.0 - 34.0 pg   MCHC 35.7 30.0 - 36.0 g/dL   RDW 65.7 84.6 - 96.2 %   Platelets 265 150 - 400 K/uL   nRBC 0.0 0.0 - 0.2 %   Neutrophils Relative % 78 %   Neutro Abs 7.6 1.7 - 7.7 K/uL   Lymphocytes Relative 12 %   Lymphs Abs 1.2 0.7 - 4.0 K/uL   Monocytes Relative 8 %   Monocytes Absolute 0.8 0.1 - 1.0 K/uL   Eosinophils Relative 1 %   Eosinophils Absolute 0.1 0.0 - 0.5 K/uL   Basophils Relative 1 %   Basophils Absolute 0.1 0.0 - 0.1 K/uL   Immature Granulocytes 0 %   Abs Immature Granulocytes 0.04 0.00 - 0.07 K/uL    Comment: Performed at [facility], [address] [place], [place] [address]  Protime-INR     Status: None   Collection Time: 03/18/23  3:00 AM  Result Value Ref Range   Prothrombin Time 13.3 11.4 - 15.2 seconds   INR 1.0 0.8 - 1.2    Comment: (NOTE) INR goal varies based on device and disease states. Performed at [facility], [address], [place], [place] [address]   Magnesium     Status: Abnormal   Collection Time: 03/18/23  3:00 AM  Result Value Ref Range   Magnesium 1.6 (L) 1.7 - 2.4 mg/dL    Comment: Performed at [facility], [address], [place], [place] [address]   CT Head Wo Contrast  Result Date: 03/18/2023 CLINICAL DATA:  Altered mental status EXAM: CT HEAD WITHOUT CONTRAST TECHNIQUE: Contiguous axial images were obtained from the base of the skull  through the vertex without intravenous contrast. RADIATION DOSE REDUCTION: This exam was performed according to the departmental dose-optimization program which includes automated exposure control, adjustment of the mA and/or kV according to patient size and/or use of iterative reconstruction technique. COMPARISON:  07/28/2019 FINDINGS: Brain: No evidence of acute infarction, hemorrhage, hydrocephalus, extra-axial collection or mass lesion/mass effect. Subcortical white matter and periventricular small vessel ischemic changes. Global cortical atrophy. Vascular: Mild intracranial atherosclerosis. Skull: Normal. Negative for fracture or focal lesion. Sinuses/Orbits: The visualized paranasal sinuses are essentially clear. The mastoid air cells are unopacified. Other: None. IMPRESSION: No acute intracranial abnormality. Atrophy with small vessel ischemic changes. Electronically Signed   By: Charline Bills M.D.   On: 03/18/2023 03:53    Pending Labs Unresulted Labs (From admission, onward)    None       Vitals/Pain Today's Vitals   03/18/23 0400 03/18/23 0430 03/18/23 0445 03/18/23 0510  BP: (!) 187/99 (!) 214/94  (!) 220/90  Pulse: 72 80  69  Resp: Temp:   98.4 F (36.9 C)   TempSrc:   Oral   SpO2: 99% 99%  97%  PainSc:        Isolation Precautions No active isolations  Medications Medications  potassium chloride 10 mEq in 100 mL IVPB (10 mEq Intravenous New Bag/Given 03/18/23 0448)  magnesium sulfate IVPB 2 g 50 mL (2 g Intravenous New Bag/Given 03/18/23 0447)  amLODipine (NORVASC) tablet 5 mg (5 mg Oral Given 03/18/23 0358)  labetalol (NORMODYNE) injection 20 mg (20 mg Intravenous Given 03/18/23 0336)  potassium chloride SA (KLOR-CON M) CR tablet 40  mEq (40 mEq Oral Given 03/18/23 0445)    Mobility walks with device     Focused Assessments   R Recommendations: See Admitting Provider Note  Report given to:   Additional Notes:

## 2023-03-18 NOTE — ED Notes (Signed)
Pt reports taking 25 mg metoprolol about an hour prior to arrival tonight, but states that she has not taken it for a couple days prior to that dose.

## 2023-03-18 NOTE — Plan of Care (Signed)

## 2023-03-18 NOTE — Progress Notes (Signed)
PROGRESS NOTE    Cindy Rhodes  QIH:[id number] DOB: 1946/05/03 DOA: 03/18/2023 PCP: Renford Dills, MD  Chief Complaint  Patient presents with   Hypertension    Brief Narrative:    Cindy Rhodes is Cindy Rhodes 77 y.o. female with medical history significant for hypertension, type 2 diabetes mellitus, and depression who presents to the emergency department for evaluation of elevated blood pressure, fatigue, loss of appetite, and difficulty with her speech and balance.   Patient reports that the aforementioned symptoms began 2 days ago and have persisted.  She denies abdominal pain or nausea but has not felt like eating anything for the past 2 days.  She has had trouble with her speech described as difficulty finding the right words to use, and she also reports difficulty ambulating the past 2 days due to balance.  Her husband states that she slept all day yesterday which is unusual for her.    She had not taken her blood pressure medications for the past 2 days but, noting her blood pressure to be severely elevated, took Micardis and metoprolol shortly prior to arrival   She denies any preceding fall or trauma and denies focal numbness or weakness, fever, chills, dysuria, abdominal pain, chest pain, or cough.   ED Course: Upon arrival to the ED, patient is found to be afebrile and saturating well on normal heart rate and severely elevated she demonstrates sinus rhythm with QTc 537.  Head CT is negative for acute intracranial abnormality.  Labs are notable for sodium 126, potassium 2.9, and magnesium 1.6.   Was treated with IV magnesium, oral and IV potassium, and IV labetalol in the ED.  Assessment & Plan:   Principal Problem:   Hypertensive crisis Active Problems:   Hypokalemia   Hyponatremia   Type 2 diabetes mellitus with other specified complication   Acute encephalopathy   Hypomagnesemia   Prolonged QT interval  1. Acute encephalopathy  Ambulatory Difficulties - Presents with 2 days  of word-finding and balance difficulty   - No acute findings on head CT  - MRI brain without acute intracranial process - follow with IVF - Possibly d/t electrolyte derangements, dehydration, or severe HTN though did not seem to improve when BP was lowered in ED    # Hyponatremia - follow on NS, will continue to follow closely  - follow orthostatics, suspect this represents hypovolemic hyponatremia given hx - hold SSRI for now  2. Hypertensive urgency  - BP as high as 220/110 in ED   - Continue home medications (metoprolol, ARB, amlodipine) - will start hydralazine 25 mg q8 hrs - IV labetalol prn    3. Hypokalemia; hypomagnesemia   - Replaced, repeat chem panel in am     5. Type II DM  - Hold metformin, check CBGs, and use low-intensity SSI for now     6. Prolonged QT interval  - QTc is 537 ms in ED  - Replace potassium and magnesium, avoid QT-prolonging medications   - repeat EKG  PT/OT evals    DVT prophylaxis: lovenox Code Status: full Family Communication: none Disposition:   Status is: Observation The patient remains OBS appropriate and will d/c before 2 midnights.   Consultants:  noen  Procedures:  none  Antimicrobials:  Anti-infectives (From admission, onward)    None       Subjective: No new complaints  Objective: Vitals:   03/18/23 0609 03/18/23 1236 03/18/23 1624 03/18/23 1745  BP: (!) 194/88 (!) 192/94 (!) 196/96 [name])  179/80  Pulse: 69 67 73 65  Resp: Temp: 98.2 F (36.8 C) 98 F (36.7 C) 98.4 F (36.9 C) 97.8 F (36.6 C)  TempSrc: Oral Oral Oral Oral  SpO2: 99% 98% 100% 97%  Weight:      Height:        Intake/Output Summary (Last 24 hours) at 03/18/2023 1757 Last data filed at 03/18/2023 1730 Gross per 24 hour  Intake 740 ml  Output --  Net 740 ml   Filed Weights   03/18/23 1610  Weight: 61.2 kg    Examination:  General exam: Appears calm and comfortable  Respiratory system: unlabored Cardiovascular  system: RRR Gastrointestinal system: Abdomen is nondistended, soft and nontender. Central nervous system: Alert and oriented.  CN 2-12 intact.  5/5 strength to upper and lower extremities.  FNF and heel to shin intact.   Extremities: no LEE    Data Reviewed: I have personally reviewed following labs and imaging studies  CBC: Recent Labs  Lab 03/18/23 0300  WBC 9.8  NEUTROABS 7.6  HGB 12.2  HCT 34.2*  MCV 82.8  PLT 265    Basic Metabolic Panel: Recent Labs  Lab 03/18/23 0300 03/18/23 1325  NA 126* 124*  K 2.9* 3.6  CL 90* 94*  CO2 23 21*  GLUCOSE 139* 157*  BUN 13 11  CREATININE 0.81 0.80  CALCIUM 8.9 8.9  MG 1.6*  --     GFR: Estimated Creatinine Clearance: 52.3 mL/min (by C-G formula based on SCr of 0.8 mg/dL).  Liver Function Tests: Recent Labs  Lab 03/18/23 0300  AST 29  ALT 20  ALKPHOS 51  BILITOT 1.2  PROT 7.8  ALBUMIN 4.5    CBG: Recent Labs  Lab 03/18/23 0806 03/18/23 1132 03/18/23 1725  GLUCAP 150* 218* 151*     No results found for this or any previous visit (from the past 240 hour(s)).       Radiology Studies: MR BRAIN WO CONTRAST  Result Date: 03/18/2023 CLINICAL DATA:  Altered mental status, speech difficulty EXAM: MRI HEAD WITHOUT CONTRAST TECHNIQUE: Multiplanar, multiecho pulse sequences of the brain and surrounding structures were obtained without intravenous contrast. COMPARISON:  No prior MRI available, correlation is made with CT head 03/18/2023 FINDINGS: Brain: No restricted diffusion to suggest acute or subacute infarct. No acute hemorrhage, mass, mass effect, or midline shift. No hydrocephalus or extra-axial collection. Normal pituitary and craniocervical junction. No hemosiderin deposition to suggest remote hemorrhage. Moderately advanced cerebral atrophy for age peer confluent T2 hyperintense signal in the periventricular white matter, likely the sequela of moderate to severe chronic small vessel ischemic disease. Vascular:  Normal arterial flow voids. Skull and upper cervical spine: Normal marrow signal. Sinuses/Orbits: Clear paranasal sinuses. No acute finding in the orbits. Other: Trace fluid in right mastoid air cells. IMPRESSION: No acute intracranial process. No evidence of acute or subacute infarct. Electronically Signed   By: Wiliam Ke M.D.   On: 03/18/2023 13:13   CT Head Wo Contrast  Result Date: 03/18/2023 CLINICAL DATA:  Altered mental status EXAM: CT HEAD WITHOUT CONTRAST TECHNIQUE: Contiguous axial images were obtained from the base of the skull through the vertex without intravenous contrast. RADIATION DOSE REDUCTION: This exam was performed according to the departmental dose-optimization program which includes automated exposure control, adjustment of the mA and/or kV according to patient size and/or use of iterative reconstruction technique. COMPARISON:  07/28/2019 FINDINGS: Brain: No evidence of acute infarction, hemorrhage, hydrocephalus, extra-axial collection or  mass lesion/mass effect. Subcortical white matter and periventricular small vessel ischemic changes. Global cortical atrophy. Vascular: Mild intracranial atherosclerosis. Skull: Normal. Negative for fracture or focal lesion. Sinuses/Orbits: The visualized paranasal sinuses are essentially clear. The mastoid air cells are unopacified. Other: None. IMPRESSION: No acute intracranial abnormality. Atrophy with small vessel ischemic changes. Electronically Signed   By: Charline Bills M.D.   On: 03/18/2023 03:53        Scheduled Meds:  [START ON 03/19/2023] amLODipine  10 mg Oral Daily   enoxaparin (LOVENOX) injection  40 mg Subcutaneous Q24H   insulin aspart  0-5 Units Subcutaneous QHS   insulin aspart  0-6 Units Subcutaneous TID WC   irbesartan  300 mg Oral Daily   metoprolol tartrate  25 mg Oral BID   pantoprazole  40 mg Oral Daily   rosuvastatin  2.5 mg Oral QHS   sodium chloride flush  3 mL Intravenous Q12H   Continuous  Infusions:   LOS: 0 days    Time spent: over 30 min    Lacretia Nicks, MD [facility]   To contact the attending provider between 7A-7P or the covering provider during after hours 7P-7A, please log into the web site [email or web address] and access using universal [place] password for that web site. If you do not have the password, please call the hospital operator.  03/18/2023, 5:57 PM

## 2023-03-18 NOTE — ED Notes (Signed)
Took pt to bathroom via wheelchair and had to instruct patient several times on how to sit on wheelchair then toilet. Pt states that she is usually "not so scatter brained" and has been this way the past couple days. Pt assisted into gown and back into bed. Dr Preston Fleeting at bedside to assess patient.

## 2023-03-18 NOTE — ED Notes (Signed)
Patient transported to CT 

## 2023-03-18 NOTE — ED Provider Notes (Signed)
[place] EMERGENCY DEPARTMENT AT [facility] Provider Note   CSN: [id number] Arrival date & time: 03/18/23  0107     History  Chief Complaint  Patient presents with   Hypertension    Cindy Rhodes is a 77 y.o. female.  The history is provided by the patient.  Hypertension  She has history of hypertension, diabetes, depression and comes in because of concern for UTI.  Over the last 2 days, she has noted that her thought processes have been scattered.  She has been more forgetful than normal and sometimes has trouble finding the right words to say and she is felt like she is off balance.  In the past, this is the way she felt when she had a UTI.  She does endorse some urinary frequency but denies dysuria or urinary urgency or tenesmus.  She denies abdominal pain or flank pain.  She denies fever or chills.  Also, she had not taken her blood pressure medicine for the last 2 days.  She took her blood pressure tonight and it was markedly elevated.  She took a dose of metoprolol and Micardis.  She denies headache or tinnitus.  She thinks that it has been about 2 months since the last time she had checked her blood pressure at which that time it was in the normal range.   Home Medications Prior to Admission medications   Medication Sig Start Date End Date Taking? Authorizing Provider  metoprolol tartrate (LOPRESSOR) 25 MG tablet Take 25 mg by mouth 2 (two) times daily.  09/30/13  Yes [provider]  amLODipine (NORVASC) 5 MG tablet Take 5 mg by mouth daily.    [provider]  Ascorbic Acid (VITAMIN C) 500 MG CAPS     [provider]  Calcium Carbonate-Vitamin D 600-400 MG-UNIT tablet Take 1 tablet by mouth 2 (two) times daily.    [provider]  Coenzyme Q10 (CO Q-10) 100 MG CAPS     [provider]  Coenzyme Q10 (COQ-10) 50 MG CAPS Take 50 mg by mouth daily.    [provider]  CRESTOR 5 MG tablet Take 2.5 mg by mouth at  bedtime.  10/06/13   [provider]  estradiol (ESTRACE) 0.1 MG/GM vaginal cream SMARTSIG:Sparingly Vaginal Twice a Week 11/03/21   [provider]  famotidine (PEPCID) 20 MG tablet 1 tablet at bedtime as needed    [provider]  famotidine (PEPCID) 20 MG tablet     [provider]  glucosamine-chondroitin 500-400 MG tablet Take 3 tablets by mouth daily.     [provider]  ibuprofen (ADVIL) 200 MG tablet     [provider]  ibuprofen (ADVIL,MOTRIN) 200 MG tablet Take 600 mg by mouth daily as needed (pain).    [provider]  ID NOW COVID-19 KIT See admin instructions. for testing 03/02/20   [provider]  metFORMIN (GLUCOPHAGE-XR) 500 MG 24 hr tablet Take 1,000 mg by mouth 2 (two) times daily. 07/01/19   [provider]  Misc Natural Products (TART CHERRY ADVANCED) CAPS Take by mouth daily at 2 PM.    [provider]  [facility] COVID-19 VACCINE 100 MCG/0.5ML injection  03/05/21   [provider]  Multiple Vitamin (MULTIVITAMIN WITH MINERALS) TABS tablet Take 1 tablet by mouth daily.    [provider]  oxybutynin (DITROPAN-XL) 10 MG 24 hr tablet Take 10 mg by mouth daily.  12/28/18   [provider]  potassium chloride SA (K-DUR) 20 MEQ tablet Take 2 tablets (40 mEq total) by mouth daily. 08/18/19   Mancel Bale, MD  sertraline (ZOLOFT) 50 MG tablet Take 75 mg by mouth daily.  07/22/13   [provider]  telmisartan (MICARDIS) 80 MG tablet Take 1 tablet (80 mg total) by mouth daily. 08/18/19   Mancel Bale, MD  triamcinolone (KENALOG) 0.1 % Apply topically. 12/27/20   [provider]  vitamin C (ASCORBIC ACID) 500 MG tablet Take 500 mg by mouth daily.    [provider]  telmisartan-hydrochlorothiazide (MICARDIS HCT) 80-25 MG tablet Take 1 tablet by mouth daily.  07/05/18 08/18/19  [provider]      Allergies    Hydrochlorothiazide, Meloxicam,  Other, and Sulfa antibiotics    Review of Systems   Review of Systems  All other systems reviewed and are negative.   Physical Exam Updated Vital Signs BP (!) 215/102   Pulse 65   Temp 98.9 F (37.2 C) (Oral)   Resp 12   SpO2 99%  Physical Exam Vitals and nursing note reviewed.   77 year old female, resting comfortably and in no acute distress. Vital signs are significant for markedly elevated blood pressure. Oxygen saturation is 99%, which is normal. Head is normocephalic and atraumatic. PERRLA, EOMI. Oropharynx is clear.  Fundi showed no hemorrhage, exudate, papilledema. Neck is nontender and supple without adenopathy or JVD. Back is nontender and there is no CVA tenderness. Lungs are clear without rales, wheezes, or rhonchi. Chest is nontender. Heart has regular rate and rhythm without murmur. Abdomen is soft, flat, nontender. Extremities have no cyanosis or edema, full range of motion is present. Skin is warm and dry without rash. Neurologic: Awake and alert, oriented x 3.  Speech spontaneous and appropriate but she sometimes has trouble maintaining a train of thought.  Naming is normal.  Cranial nerves are intact.  Strength is 5/5 in all 4 extremities.  There is no pronator drift.  Sensation is normal.  Finger-nose testing is normal.  ED Results / Procedures / Treatments   Labs (all labs ordered are listed, but only abnormal results are displayed) Labs Reviewed  URINALYSIS, W/ REFLEX TO CULTURE (INFECTION SUSPECTED) - Abnormal; Notable for the following components:      Result Value   Color, Urine COLORLESS (*)    Specific Gravity, Urine 1.003 (*)    Ketones, ur 5 (*)    Protein, ur 100 (*)    All other components within normal limits  COMPREHENSIVE METABOLIC PANEL - Abnormal; Notable for the following components:   Sodium 126 (*)    Potassium 2.9 (*)    Chloride 90 (*)    Glucose, Bld 139 (*)    All other components within normal limits  CBC WITH  DIFFERENTIAL/PLATELET - Abnormal; Notable for the following components:   HCT 34.2 (*)    All other components within normal limits  MAGNESIUM - Abnormal; Notable for the following components:   Magnesium 1.6 (*)    All other components within normal limits  PROTIME-INR    EKG EKG Interpretation  Date/Time:  Thursday March 18 2023 01:55:12 EDT Ventricular Rate:  75 PR Interval:  127 QRS Duration: 85 QT Interval:  480 QTC Calculation: 537 R Axis:   15 Text Interpretation: Sinus rhythm Probable left atrial enlargement Low voltage, precordial leads Borderline repolarization abnormality Prolonged QT interval When compared with ECG of 08/18/2019, No significant change was found Confirmed by Dione Booze ([id number]) on 03/18/2023  2:02:48 AM  Radiology CT Head Wo Contrast  Result Date: 03/18/2023 CLINICAL DATA:  Altered mental status EXAM: CT HEAD WITHOUT CONTRAST TECHNIQUE: Contiguous axial images were obtained from the base of the skull through the vertex without intravenous contrast. RADIATION DOSE REDUCTION: This exam was performed according to the departmental dose-optimization program which includes automated exposure control, adjustment of the mA and/or kV according to patient size and/or use of iterative reconstruction technique. COMPARISON:  07/28/2019 FINDINGS: Brain: No evidence of acute infarction, hemorrhage, hydrocephalus, extra-axial collection or mass lesion/mass effect. Subcortical white matter and periventricular small vessel ischemic changes. Global cortical atrophy. Vascular: Mild intracranial atherosclerosis. Skull: Normal. Negative for fracture or focal lesion. Sinuses/Orbits: The visualized paranasal sinuses are essentially clear. The mastoid air cells are unopacified. Other: None. IMPRESSION: No acute intracranial abnormality. Atrophy with small vessel ischemic changes. Electronically Signed   By: Charline Bills M.D.   On: 03/18/2023 03:53    Procedures Procedures  Cardiac  monitor shows normal sinus rhythm, per my interpretation.  Medications Ordered in ED Medications - No data to display  ED Course/ Medical Decision Making/ A&P                             Medical Decision Making Amount and/or Complexity of Data Reviewed Labs: ordered. Radiology: ordered.  Risk Prescription drug management. Decision regarding hospitalization.   Subtle mental status change which could be from occult UTI but also need to consider hypertensive encephalopathy.  No focal findings to suggest stroke.  Consider other metabolic encephalopathy such as electrolyte disturbance.  I have ordered a dose of amlodipine and has also a dose of intravenous labetalol.  I have ordered laboratory workup of CBC, comprehensive metabolic panel, magnesium level, urinalysis.  I have ordered CT of the head to look for evidence of hemorrhage or recent stroke.  I have reviewed and interpreted her electrocardiogram and my interpretation is sinus rhythm with borderline repolarization  la normality and prolonged QT interval unchanged from prior.  CT scan shows no acute intracranial process.  I have independently viewed the images, and agree with the radiologist interpretation.  I have reviewed and interpreted her laboratory tests, and my interpretation is no evidence of UTI but proteinuria and ketonuria are present, normal CBC, moderate hyponatremia, moderate to severe hypokalemia, mild hypomagnesemia.  Blood pressure has come down with above-noted treatment, but still moderately elevated.  I believe that the cause of her symptoms are multifactorial between severe hypertension and electrolyte disturbance.  I have ordered oral and intravenous potassium, intravenous magnesium.  I have discussed the case with Dr. Antionette Char of [facility], who agrees to admit the patient.  CRITICAL CARE Performed by: Dione Booze Total critical care time: 60 minutes Critical care time was exclusive of separately billable  procedures and treating other patients. Critical care was necessary to treat or prevent imminent or life-threatening deterioration. Critical care was time spent personally by me on the following activities: development of treatment plan with patient and/or surrogate as well as nursing, discussions with consultants, evaluation of patient's response to treatment, examination of patient, obtaining history from patient or surrogate, ordering and performing treatments and interventions, ordering and review of laboratory studies, ordering and review of radiographic studies, pulse oximetry and re-evaluation of patient's condition.  Final Clinical Impression(s) / ED Diagnoses Final diagnoses:  Metabolic encephalopathy  Hypertensive urgency  Hyponatremia  Hypokalemia  Hypomagnesemia   Rx / DC Orders ED Discharge Orders  None         Dione Booze, MD 03/18/23 [phone]

## 2023-03-18 NOTE — H&P (Signed)
History and Physical    Cindy Rhodes ZOX:[id number] DOB: [birth year] DOA: 03/18/2023  PCP: Renford Dills, MD   Patient coming from: Home   Chief Complaint: High BP, speech and balance difficulty, fatigue, loss of appetite  HPI: Cindy Rhodes is a 77 y.o. female with medical history significant for hypertension, type 2 diabetes mellitus, and depression who presents to the emergency department for evaluation of elevated blood pressure, fatigue, loss of appetite, and difficulty with her speech and balance.  Patient reports that the aforementioned symptoms began 2 days ago and have persisted.  She denies abdominal pain or nausea but has not felt like eating anything for the past 2 days.  She has had trouble with her speech described as difficulty finding the right words to use, and she also reports difficulty ambulating the past 2 days due to balance.  Her husband states that she slept all day yesterday which is unusual for her.   She had not taken her blood pressure medications for the past 2 days but, noting her blood pressure to be severely elevated, took Micardis and metoprolol shortly prior to arrival  She denies any preceding fall or trauma and denies focal numbness or weakness, fever, chills, dysuria, abdominal pain, chest pain, or cough.  ED Course: Upon arrival to the ED, patient is found to be afebrile and saturating well on normal heart rate and severely elevated she demonstrates sinus rhythm with QTc 537.  Head CT is negative for acute intracranial abnormality.  Labs are notable for sodium 126, potassium 2.9, and magnesium 1.6.  Was treated with IV magnesium, oral and IV potassium, and IV labetalol in the ED.  Review of Systems:  All other systems reviewed and apart from HPI, are negative.  Past Medical History:  Diagnosis Date   Depression    Diabetes mellitus without complication    Hypertension     Past Surgical History:  Procedure Laterality Date   ABDOMINAL  HYSTERECTOMY     HAND SURGERY Left     Social History:   reports that she has quit smoking. She has never used smokeless tobacco. She reports that she does not drink alcohol and does not use drugs.  Allergies  Allergen Reactions   Hydrochlorothiazide Other (See Comments)   Meloxicam Other (See Comments)    Dizzy and nervous  Other reaction(s): Other (See Comments) Dizzy and nervous    Other    Sulfa Antibiotics Other (See Comments)    Shuts down urinary system Shuts down urinary system    Family History  Problem Relation Age of Onset   Hypertension Other      Prior to Admission medications   Medication Sig Start Date End Date Taking? Authorizing Provider  metoprolol tartrate (LOPRESSOR) 25 MG tablet Take 25 mg by mouth 2 (two) times daily.  09/30/13  Yes [provider]  amLODipine (NORVASC) 5 MG tablet Take 5 mg by mouth daily.    [provider]  Ascorbic Acid (VITAMIN C) 500 MG CAPS     [provider]  Calcium Carbonate-Vitamin D 600-400 MG-UNIT tablet Take 1 tablet by mouth 2 (two) times daily.    [provider]  Coenzyme Q10 (CO Q-10) 100 MG CAPS     [provider]  Coenzyme Q10 (COQ-10) 50 MG CAPS Take 50 mg by mouth daily.    [provider]  CRESTOR 5 MG tablet Take 2.5 mg by mouth at bedtime.  10/06/13   [provider]  estradiol (  ESTRACE) 0.1 MG/GM vaginal cream SMARTSIG:Sparingly Vaginal Twice a Week 11/03/21   [provider]  famotidine (PEPCID) 20 MG tablet 1 tablet at bedtime as needed    [provider]  famotidine (PEPCID) 20 MG tablet     [provider]  glucosamine-chondroitin 500-400 MG tablet Take 3 tablets by mouth daily.     [provider]  ibuprofen (ADVIL) 200 MG tablet     [provider]  ibuprofen (ADVIL,MOTRIN) 200 MG tablet Take 600 mg by mouth daily as needed (pain).    [provider]  ID NOW COVID-19 KIT See admin  instructions. for testing 03/02/20   [provider]  metFORMIN (GLUCOPHAGE-XR) 500 MG 24 hr tablet Take 1,000 mg by mouth 2 (two) times daily. 07/01/19   [provider]  Misc Natural Products (TART CHERRY ADVANCED) CAPS Take by mouth daily at 2 PM.    [provider]  [facility] COVID-19 VACCINE 100 MCG/0.5ML injection  03/05/21   [provider]  Multiple Vitamin (MULTIVITAMIN WITH MINERALS) TABS tablet Take 1 tablet by mouth daily.    [provider]  oxybutynin (DITROPAN-XL) 10 MG 24 hr tablet Take 10 mg by mouth daily.  12/28/18   [provider]  potassium chloride SA (K-DUR) 20 MEQ tablet Take 2 tablets (40 mEq total) by mouth daily. 08/18/19   Mancel Bale, MD  sertraline (ZOLOFT) 50 MG tablet Take 75 mg by mouth daily.  07/22/13   [provider]  telmisartan (MICARDIS) 80 MG tablet Take 1 tablet (80 mg total) by mouth daily. 08/18/19   Mancel Bale, MD  triamcinolone (KENALOG) 0.1 % Apply topically. 12/27/20   [provider]  vitamin C (ASCORBIC ACID) 500 MG tablet Take 500 mg by mouth daily.    [provider]  telmisartan-hydrochlorothiazide (MICARDIS HCT) 80-25 MG tablet Take 1 tablet by mouth daily.  07/05/18 08/18/19  [provider]    Physical Exam: Vitals:   03/18/23 0430 03/18/23 0445 03/18/23 0510 03/18/23 0530  BP: (!) 214/94  (!) 220/90 (!) 204/85  Pulse: 80  69 72  Resp: Temp:  98.4 F (36.9 C)    TempSrc:  Oral    SpO2: 99%  97% 96%    Constitutional: NAD, calm  Eyes: PERTLA, lids and conjunctivae normal ENMT: Mucous membranes are moist. Posterior pharynx clear of any exudate or lesions.   Neck: supple, no masses  Respiratory: no wheezing, no crackles. No accessory muscle use.  Cardiovascular: S1 & S2 heard, regular rate and rhythm. No JVD. Abdomen: No distension, no tenderness, soft. Bowel sounds active.  Musculoskeletal: no clubbing / cyanosis. No joint deformity upper  and lower extremities.   Skin: no significant rashes, lesions, ulcers. Warm, dry, well-perfused. Neurologic: CN 2-12 grossly intact. Sensation to light touch intact. Strength 5/5 in all 4 limbs. Alert and oriented to person, place, and situation.  Psychiatric: Pleasant. Cooperative.    Labs and Imaging on Admission: I have personally reviewed following labs and imaging studies  CBC: Recent Labs  Lab 03/18/23 0300  WBC 9.8  NEUTROABS 7.6  HGB 12.2  HCT 34.2*  MCV 82.8  PLT 265   Basic Metabolic Panel: Recent Labs  Lab 03/18/23 0300  NA 126*  K 2.9*  CL 90*  CO2 23  GLUCOSE 139*  BUN 13  CREATININE 0.81  CALCIUM 8.9  MG 1.6*   GFR: CrCl cannot be calculated (Unknown ideal weight.). Liver Function Tests: Recent  Labs  Lab 03/18/23 0300  AST 29  ALT 20  ALKPHOS 51  BILITOT 1.2  PROT 7.8  ALBUMIN 4.5   No results for input(s): "LIPASE", "AMYLASE" in the last 168 hours. No results for input(s): "AMMONIA" in the last 168 hours. Coagulation Profile: Recent Labs  Lab 03/18/23 0300  INR 1.0   Cardiac Enzymes: No results for input(s): "CKTOTAL", "CKMB", "CKMBINDEX", "TROPONINI" in the last 168 hours. BNP (last 3 results) No results for input(s): "PROBNP" in the last 8760 hours. HbA1C: No results for input(s): "HGBA1C" in the last 72 hours. CBG: No results for input(s): "GLUCAP" in the last 168 hours. Lipid Profile: No results for input(s): "CHOL", "HDL", "LDLCALC", "TRIG", "CHOLHDL", "LDLDIRECT" in the last 72 hours. Thyroid Function Tests: No results for input(s): "TSH", "T4TOTAL", "FREET4", "T3FREE", "THYROIDAB" in the last 72 hours. Anemia Panel: No results for input(s): "VITAMINB12", "FOLATE", "FERRITIN", "TIBC", "IRON", "RETICCTPCT" in the last 72 hours. Urine analysis:    Component Value Date/Time   COLORURINE COLORLESS (A) 03/18/2023 0255   APPEARANCEUR CLEAR 03/18/2023 0255   LABSPEC 1.003 (L) 03/18/2023 0255   PHURINE 7.0 03/18/2023 0255    GLUCOSEU NEGATIVE 03/18/2023 0255   HGBUR NEGATIVE 03/18/2023 0255   BILIRUBINUR NEGATIVE 03/18/2023 0255   KETONESUR 5 (A) 03/18/2023 0255   PROTEINUR 100 (A) 03/18/2023 0255   UROBILINOGEN 0.2 04/02/2014 1222   NITRITE NEGATIVE 03/18/2023 0255   LEUKOCYTESUR NEGATIVE 03/18/2023 0255   Sepsis Labs: (procalcitonin:4,lacticidven:4) )No results found for this or any previous visit (from the past 240 hour(s)).   Radiological Exams on Admission: CT Head Wo Contrast  Result Date: 03/18/2023 CLINICAL DATA:  Altered mental status EXAM: CT HEAD WITHOUT CONTRAST TECHNIQUE: Contiguous axial images were obtained from the base of the skull through the vertex without intravenous contrast. RADIATION DOSE REDUCTION: This exam was performed according to the departmental dose-optimization program which includes automated exposure control, adjustment of the mA and/or kV according to patient size and/or use of iterative reconstruction technique. COMPARISON:  07/28/2019 FINDINGS: Brain: No evidence of acute infarction, hemorrhage, hydrocephalus, extra-axial collection or mass lesion/mass effect. Subcortical white matter and periventricular small vessel ischemic changes. Global cortical atrophy. Vascular: Mild intracranial atherosclerosis. Skull: Normal. Negative for fracture or focal lesion. Sinuses/Orbits: The visualized paranasal sinuses are essentially clear. The mastoid air cells are unopacified. Other: None. IMPRESSION: No acute intracranial abnormality. Atrophy with small vessel ischemic changes. Electronically Signed   By: Charline Bills M.D.   On: 03/18/2023 03:53    EKG: Independently reviewed. Sinus rhythm, QTc 537 ms.   Assessment/Plan   1. Acute encephalopathy  - Presents with 2 days of word-finding and balance difficulty   - No acute findings on head CT  - Possibly d/t electrolyte derangements, dehydration, or severe HTN though did not seem to improve when BP was lowered in ED  -  Check MRI brain, correct electrolytes, hydrate with IVF, continue BP-control, and expand workup if sxs persist and MRI unrevealing    2. Hypertensive urgency  - BP as high as 220/110 in ED   - Continue home medications (metoprolol, ARB, amlodipine), continue labetalol IVPs as needed   3. Hypokalemia; hypomagnesemia   - Replaced, repeat chem panel in am    4. Hyponatremia  - Serum sodium 126 on admission in setting of recent loss of appetite and SSRI use  - Hold Zoloft, start isotonic IVF hydration, repeat chem panel in am   5. Type II DM  - Hold metformin, check CBGs, and use  low-intensity SSI for now    6. Prolonged QT interval  - QTc is 537 ms in ED  - Replace potassium and magnesium, avoid QT-prolonging medications     DVT prophylaxis: Lovenox  Code Status: Full  Level of Care: Level of care: Progressive Family Communication: Husband at bedside   Disposition Plan:  Patient is from: Home  Anticipated d/c is to: TBD Anticipated d/c date is: Possibly as early as 03/19/23  Patient currently: Pending MRI brain, BP-control, may need PT eval  Consults called: none  Admission status: Observation     Briscoe Deutscher, MD [facility]  03/18/2023, 6:02 AM

## 2023-03-18 NOTE — Plan of Care (Signed)
  Problem: Education: Goal: Ability to describe self-care measures that may prevent or decrease complications (Diabetes Survival Skills Education) will improve Outcome: Progressing Goal: Individualized Educational Video(s) Outcome: Progressing   Problem: Coping: Goal: Ability to adjust to condition or change in health will improve Outcome: Progressing   Problem: Fluid Volume: Goal: Ability to maintain a balanced intake and output will improve Outcome: Progressing   Problem: Health Behavior/Discharge Planning: Goal: Ability to identify and utilize available resources and services will improve Outcome: Progressing Goal: Ability to manage health-related needs will improve Outcome: Progressing   Problem: Metabolic: Goal: Ability to maintain appropriate glucose levels will improve Outcome: Progressing   Problem: Nutritional: Goal: Maintenance of adequate nutrition will improve Outcome: Progressing Goal: Progress toward achieving an optimal weight will improve Outcome: Progressing   Problem: Skin Integrity: Goal: Risk for impaired skin integrity will decrease Outcome: Progressing   Problem: Education: Goal: Knowledge of General Education information will improve Description: Including pain rating scale, medication(s)/side effects and non-pharmacologic comfort measures Outcome: Progressing   Problem: Health Behavior/Discharge Planning: Goal: Ability to manage health-related needs will improve Outcome: Progressing   Problem: Clinical Measurements: Goal: Ability to maintain clinical measurements within normal limits will improve Outcome: Progressing Goal: Will remain free from infection Outcome: Progressing Goal: Diagnostic test results will improve Outcome: Progressing Goal: Respiratory complications will improve Outcome: Progressing Goal: Cardiovascular complication will be avoided Outcome: Progressing   Problem: Activity: Goal: Risk for activity intolerance will  decrease Outcome: Progressing   Problem: Nutrition: Goal: Adequate nutrition will be maintained Outcome: Progressing   Problem: Coping: Goal: Level of anxiety will decrease Outcome: Progressing   Problem: Elimination: Goal: Will not experience complications related to bowel motility Outcome: Progressing Goal: Will not experience complications related to urinary retention Outcome: Progressing   Problem: Pain Managment: Goal: General experience of comfort will improve Outcome: Progressing   Problem: Safety: Goal: Ability to remain free from injury will improve Outcome: Progressing   Problem: Skin Integrity: Goal: Risk for impaired skin integrity will decrease Outcome: Progressing   

## 2023-03-19 ENCOUNTER — Inpatient Hospital Stay (HOSPITAL_COMMUNITY): Payer: PPO

## 2023-03-19 ENCOUNTER — Observation Stay (HOSPITAL_COMMUNITY): Payer: PPO

## 2023-03-19 DIAGNOSIS — Z90710 Acquired absence of both cervix and uterus: Secondary | ICD-10-CM | POA: Diagnosis not present

## 2023-03-19 DIAGNOSIS — Z87891 Personal history of nicotine dependence: Secondary | ICD-10-CM | POA: Diagnosis not present

## 2023-03-19 DIAGNOSIS — I161 Hypertensive emergency: Secondary | ICD-10-CM | POA: Diagnosis present

## 2023-03-19 DIAGNOSIS — E876 Hypokalemia: Secondary | ICD-10-CM | POA: Diagnosis present

## 2023-03-19 DIAGNOSIS — E1169 Type 2 diabetes mellitus with other specified complication: Secondary | ICD-10-CM | POA: Diagnosis present

## 2023-03-19 DIAGNOSIS — Z8249 Family history of ischemic heart disease and other diseases of the circulatory system: Secondary | ICD-10-CM | POA: Diagnosis not present

## 2023-03-19 DIAGNOSIS — R008 Other abnormalities of heart beat: Secondary | ICD-10-CM | POA: Diagnosis not present

## 2023-03-19 DIAGNOSIS — I10 Essential (primary) hypertension: Secondary | ICD-10-CM | POA: Diagnosis present

## 2023-03-19 DIAGNOSIS — Z888 Allergy status to other drugs, medicaments and biological substances status: Secondary | ICD-10-CM | POA: Diagnosis not present

## 2023-03-19 DIAGNOSIS — R9431 Abnormal electrocardiogram [ECG] [EKG]: Secondary | ICD-10-CM | POA: Diagnosis present

## 2023-03-19 DIAGNOSIS — I169 Hypertensive crisis, unspecified: Secondary | ICD-10-CM | POA: Diagnosis not present

## 2023-03-19 DIAGNOSIS — Z79899 Other long term (current) drug therapy: Secondary | ICD-10-CM | POA: Diagnosis not present

## 2023-03-19 DIAGNOSIS — F32A Depression, unspecified: Secondary | ICD-10-CM | POA: Diagnosis present

## 2023-03-19 DIAGNOSIS — E871 Hypo-osmolality and hyponatremia: Secondary | ICD-10-CM | POA: Diagnosis present

## 2023-03-19 DIAGNOSIS — Z882 Allergy status to sulfonamides status: Secondary | ICD-10-CM | POA: Diagnosis not present

## 2023-03-19 DIAGNOSIS — G9341 Metabolic encephalopathy: Secondary | ICD-10-CM | POA: Diagnosis present

## 2023-03-19 DIAGNOSIS — Z7984 Long term (current) use of oral hypoglycemic drugs: Secondary | ICD-10-CM | POA: Diagnosis not present

## 2023-03-19 LAB — CBC
HCT: 31.9 % — ABNORMAL LOW (ref 36.0–46.0)
Hemoglobin: 11.1 g/dL — ABNORMAL LOW (ref 12.0–15.0)
MCH: 29.3 pg (ref 26.0–34.0)
MCHC: 34.8 g/dL (ref 30.0–36.0)
MCV: 84.2 fL (ref 80.0–100.0)
Platelets: 242 10*3/uL (ref 150–400)
RBC: 3.79 MIL/uL — ABNORMAL LOW (ref 3.87–5.11)
RDW: 13.6 % (ref 11.5–15.5)
WBC: 8.7 10*3/uL (ref 4.0–10.5)
nRBC: 0 % (ref 0.0–0.2)

## 2023-03-19 LAB — BASIC METABOLIC PANEL
Anion gap: 9 (ref 5–15)
Anion gap: 11 (ref 5–15)
Anion gap: 7 (ref 5–15)
BUN: 12 mg/dL (ref 8–23)
BUN: 13 mg/dL (ref 8–23)
BUN: 12 mg/dL (ref 8–23)
CO2: 21 mmol/L — ABNORMAL LOW (ref 22–32)
CO2: 21 mmol/L — ABNORMAL LOW (ref 22–32)
CO2: 21 mmol/L — ABNORMAL LOW (ref 22–32)
Calcium: 8.4 mg/dL — ABNORMAL LOW (ref 8.9–10.3)
Calcium: 8.8 mg/dL — ABNORMAL LOW (ref 8.9–10.3)
Calcium: 8.4 mg/dL — ABNORMAL LOW (ref 8.9–10.3)
Chloride: 95 mmol/L — ABNORMAL LOW (ref 98–111)
Chloride: 94 mmol/L — ABNORMAL LOW (ref 98–111)
Chloride: 95 mmol/L — ABNORMAL LOW (ref 98–111)
Creatinine, Ser: 0.93 mg/dL (ref 0.44–1.00)
Creatinine, Ser: 0.95 mg/dL (ref 0.44–1.00)
Creatinine, Ser: 0.87 mg/dL (ref 0.44–1.00)
GFR, Estimated: >60 mL/min (ref >60)
GFR, Estimated: >60 mL/min (ref >60)
GFR, Estimated: >60 mL/min (ref >60)
Glucose, Bld: 123 mg/dL — ABNORMAL HIGH (ref 70–99)
Glucose, Bld: 139 mg/dL — ABNORMAL HIGH (ref 70–99)
Glucose, Bld: 196 mg/dL — ABNORMAL HIGH (ref 70–99)
Potassium: 3.4 mmol/L — ABNORMAL LOW (ref 3.5–5.1)
Potassium: 3.4 mmol/L — ABNORMAL LOW (ref 3.5–5.1)
Potassium: 4 mmol/L (ref 3.5–5.1)
Sodium: 125 mmol/L — ABNORMAL LOW (ref 135–145)
Sodium: 126 mmol/L — ABNORMAL LOW (ref 135–145)
Sodium: 123 mmol/L — ABNORMAL LOW (ref 135–145)

## 2023-03-19 LAB — GLUCOSE, CAPILLARY
Glucose-Capillary: 189 mg/dL — ABNORMAL HIGH (ref 70–99)
Glucose-Capillary: 120 mg/dL — ABNORMAL HIGH (ref 70–99)
Glucose-Capillary: 212 mg/dL — ABNORMAL HIGH (ref 70–99)

## 2023-03-19 LAB — IRON AND TIBC
Iron: 58 ug/dL (ref 28–170)
Saturation Ratios: 16 % (ref 10.4–31.8)
TIBC: 368 ug/dL (ref 250–450)
UIBC: 310 ug/dL

## 2023-03-19 LAB — SODIUM, URINE, RANDOM: Sodium, Ur: <10 mmol/L

## 2023-03-19 LAB — OSMOLALITY, URINE: Osmolality, Ur: 130 mOsm/kg — ABNORMAL LOW (ref 300–900)

## 2023-03-19 LAB — ECHOCARDIOGRAM COMPLETE
Area-P 1/2: 2.95 cm2
Height: 62 in
S' Lateral: 2.7 cm
Weight: 2257.51 oz

## 2023-03-19 LAB — HEMOGLOBIN A1C
Hgb A1c MFr Bld: 5.8 % — ABNORMAL HIGH (ref 4.8–5.6)
Mean Plasma Glucose: 119.76 mg/dL

## 2023-03-19 LAB — CORTISOL: Cortisol, Plasma: 7.1 ug/dL

## 2023-03-19 LAB — MAGNESIUM: Magnesium: 2 mg/dL (ref 1.7–2.4)

## 2023-03-19 LAB — TSH: TSH: 2.919 u[IU]/mL (ref 0.350–4.500)

## 2023-03-19 LAB — VITAMIN B12: Vitamin B-12: 467 pg/mL (ref 180–914)

## 2023-03-19 LAB — FOLATE: Folate: 39 ng/mL (ref >5.9)

## 2023-03-19 LAB — FERRITIN: Ferritin: 33 ng/mL (ref 11–307)

## 2023-03-19 MED ORDER — PERFLUTREN LIPID MICROSPHERE
1.0000 mL | INTRAVENOUS | Status: AC | PRN
  Administered 2023-03-19: 2 mL via INTRAVENOUS

## 2023-03-19 MED ORDER — COSYNTROPIN 0.25 MG IJ SOLR
0.2500 mg | Freq: Once | INTRAMUSCULAR | Status: AC
  Administered 2023-03-20: 0.25 mg via INTRAVENOUS
  Filled 2023-03-19: qty 0.25

## 2023-03-19 MED ORDER — POTASSIUM CHLORIDE CRYS ER 20 MEQ PO TBCR
40.0000 meq | EXTENDED_RELEASE_TABLET | Freq: Once | ORAL | Status: AC
  Administered 2023-03-19: 40 meq via ORAL
  Filled 2023-03-19: qty 2

## 2023-03-19 NOTE — Progress Notes (Signed)
PROGRESS NOTE    Cindy Rhodes  WUJ:[id number] DOB: [birth year] DOA: 03/18/2023 PCP: Renford Dills, MD  Chief Complaint  Patient presents with   Hypertension    Brief Narrative:    Cindy Rhodes is Cindy Rhodes 77 y.o. female with medical history significant for hypertension, type 2 diabetes mellitus, and depression who presents to the emergency department for evaluation of elevated blood pressure, fatigue, loss of appetite, and difficulty with her speech and balance.   Patient reports that the aforementioned symptoms began 2 days ago and have persisted.  She denies abdominal pain or nausea but has not felt like eating anything for the past 2 days.  She has had trouble with her speech described as difficulty finding the right words to use, and she also reports difficulty ambulating the past 2 days due to balance.  Her husband states that she slept all day yesterday which is unusual for her.    She had not taken her blood pressure medications for the past 2 days but, noting her blood pressure to be severely elevated, took Micardis and metoprolol shortly prior to arrival   She denies any preceding fall or trauma and denies focal numbness or weakness, fever, chills, dysuria, abdominal pain, chest pain, or cough.   ED Course: Upon arrival to the ED, patient is found to be afebrile and saturating well on normal heart rate and severely elevated she demonstrates sinus rhythm with QTc 537.  Head CT is negative for acute intracranial abnormality.  Labs are notable for sodium 126, potassium 2.9, and magnesium 1.6.   Was treated with IV magnesium, oral and IV potassium, and IV labetalol in the ED.  Assessment & Plan:   Principal Problem:   Hypertensive crisis Active Problems:   Hypokalemia   Hyponatremia   Type 2 diabetes mellitus with other specified complication   Acute encephalopathy   Hypomagnesemia   Prolonged QT interval  1. Acute encephalopathy  Ambulatory Difficulties - Presents with 2 days  of word-finding and balance difficulty   - No acute findings on head CT  - MRI brain without acute intracranial process - follow with IVF  - overall improved today - normal b12, folate, TSH - UA not concerning for UTI - Possibly d/t electrolyte derangements, dehydration, or severe HTN though did not seem to improve when BP was lowered in ED    # Hyponatremia - follow on NS, will continue to follow closely  - follow orthostatics (positive 4/18) - hold SSRI for now - normal TSH.  Pending ACTH stim test. - urine sodium <10, will continue IVF   2. Hypertensive urgency  - BP as high as 220/110 in ED   - Continue home medications (metoprolol, ARB, amlodipine) - will start hydralazine 25 mg q8 hrs - with her low potassium and significant hypertension, follow renin/aldo levels - IV labetalol prn    3. Hypokalemia; hypomagnesemia   Replace and follow    5. Type II DM  - Hold metformin, check CBGs, and use low-intensity SSI for now     6. Prolonged QT interval  - QTc is 537 ms in ED -> 477  - Replace potassium and magnesium, avoid QT-prolonging medications    PT/OT evals    DVT prophylaxis: lovenox Code Status: full Family Communication: none Disposition:   Status is: Observation The patient remains OBS appropriate and will d/c before 2 midnights.   Consultants:  noen  Procedures:  Echo IMPRESSIONS     1. Left ventricular ejection fraction, by  estimation, is 55 to 60%. The  left ventricle has normal function. The left ventricle has no regional  wall motion abnormalities. There is mild concentric left ventricular  hypertrophy. Left ventricular diastolic  parameters are consistent with Grade I diastolic dysfunction (impaired  relaxation).   2. Right ventricular systolic function is normal. The right ventricular  size is normal. There is normal pulmonary artery systolic pressure. The  estimated right ventricular systolic pressure is 24.3 mmHg.   3. Left atrial size  was mildly dilated.   4. The mitral valve is normal in structure. No evidence of mitral valve  regurgitation. No evidence of mitral stenosis.   5. The aortic valve is tricuspid. Aortic valve regurgitation is not  visualized. No aortic stenosis is present.   6. The inferior vena cava is normal in size with greater than 50%  respiratory variability, suggesting right atrial pressure of 3 mmHg.   Antimicrobials:  Anti-infectives (From admission, onward)    None       Subjective: Feels better today Husband and son note she sounds better   Objective: Vitals:   03/18/23 2101 03/19/23 0500 03/19/23 0520 03/19/23 1434  BP: (!) 185/82  (!) 164/86 (!) 155/70  Pulse: 72  68 65  Resp: 18  20 17   Temp: (!) 97.5 F (36.4 C)  98.7 F (37.1 C) 98 F (36.7 C)  TempSrc: Oral  Oral Oral  SpO2: 99%  98% 99%  Weight:  64 kg    Height:        Intake/Output Summary (Last 24 hours) at 03/19/2023 1701 Last data filed at 03/19/2023 1500 Gross per 24 hour  Intake 1057.31 ml  Output 500 ml  Net 557.31 ml   Filed Weights   03/18/23 0608 03/19/23 0500  Weight: 61.2 kg 64 kg    Examination:  General: No acute distress. Cardiovascular: Heart sounds show Ladrea Holladay regular rate, and rhythm.  Lungs: Clear to auscultation bilaterally Abdomen: Soft, nontender, nondistended Neurological: Alert. Moves all extremities 4. Cranial nerves II through XII grossly intact. Extremities: No clubbing or cyanosis. No edema.   Data Reviewed: I have personally reviewed following labs and imaging studies  CBC: Recent Labs  Lab 03/18/23 0300 03/19/23 0402  WBC 9.8 8.7  NEUTROABS 7.6  --   HGB 12.2 11.1*  HCT 34.2* 31.9*  MCV 82.8 84.2  PLT 265 242    Basic Metabolic Panel: Recent Labs  Lab 03/18/23 0300 03/18/23 1325 03/19/23 0402 03/19/23 0848  NA 126* 124* 125* 126*  K 2.9* 3.6 3.4* 3.4*  CL 90* 94* 95* 94*  CO2 23 21* 21* 21*  GLUCOSE 139* 157* 123* 139*  BUN 13 11 12 13   CREATININE 0.81 0.80  0.93 0.95  CALCIUM 8.9 8.9 8.4* 8.8*  MG 1.6*  --  2.0  --     GFR: Estimated Creatinine Clearance: 45 mL/min (by C-G formula based on SCr of 0.95 mg/dL).  Liver Function Tests: Recent Labs  Lab 03/18/23 0300  AST 29  ALT 20  ALKPHOS 51  BILITOT 1.2  PROT 7.8  ALBUMIN 4.5    CBG: Recent Labs  Lab 03/18/23 0806 03/18/23 1132 03/18/23 1725 03/18/23 2059 03/19/23 1147  GLUCAP 150* 218* 151* 200* 189*     No results found for this or any previous visit (from the past 240 hour(s)).       Radiology Studies: ECHOCARDIOGRAM COMPLETE  Result Date: 03/19/2023    ECHOCARDIOGRAM REPORT   Patient Name:   BREUNA LOVEALL Ebers  Date of Exam: 03/19/2023 Medical Rec #:  [id number]      Height:       62.0 in Accession #:    [id number]     Weight:       141.1 lb Date of Birth:  September 29, 1946       BSA:          1.648 m Patient Age:    76 years       BP:           164/86 mmHg Patient Gender: F              HR:           64 bpm. Exam Location:  Inpatient Procedure: 2D Echo, Color Doppler, Cardiac Doppler and Intracardiac            Opacification Agent Indications:    R00.8 Other abnormalities of heart beat  History:        Patient has no prior history of Echocardiogram examinations.                 Risk Factors:Diabetes and Hypertension.  Sonographer:    Milbert Coulter Referring Phys: [phone] Draylen Lobue CALDWELL POWELL JR  Sonographer Comments: Suboptimal apical window. Dense breast tissue. IMPRESSIONS  1. Left ventricular ejection fraction, by estimation, is 55 to 60%. The left ventricle has normal function. The left ventricle has no regional wall motion abnormalities. There is mild concentric left ventricular hypertrophy. Left ventricular diastolic parameters are consistent with Grade I diastolic dysfunction (impaired relaxation).  2. Right ventricular systolic function is normal. The right ventricular size is normal. There is normal pulmonary artery systolic pressure. The estimated right ventricular systolic pressure  is 24.3 mmHg.  3. Left atrial size was mildly dilated.  4. The mitral valve is normal in structure. No evidence of mitral valve regurgitation. No evidence of mitral stenosis.  5. The aortic valve is tricuspid. Aortic valve regurgitation is not visualized. No aortic stenosis is present.  6. The inferior vena cava is normal in size with greater than 50% respiratory variability, suggesting right atrial pressure of 3 mmHg. FINDINGS  Left Ventricle: Left ventricular ejection fraction, by estimation, is 55 to 60%. The left ventricle has normal function. The left ventricle has no regional wall motion abnormalities. Definity contrast agent was given IV to delineate the left ventricular  endocardial borders. The left ventricular internal cavity size was normal in size. There is mild concentric left ventricular hypertrophy. Left ventricular diastolic parameters are consistent with Grade I diastolic dysfunction (impaired relaxation). Right Ventricle: The right ventricular size is normal. No increase in right ventricular wall thickness. Right ventricular systolic function is normal. There is normal pulmonary artery systolic pressure. The tricuspid regurgitant velocity is 2.31 m/s, and  with an assumed right atrial pressure of 3 mmHg, the estimated right ventricular systolic pressure is 24.3 mmHg. Left Atrium: Left atrial size was mildly dilated. Right Atrium: Right atrial size was normal in size. Pericardium: There is no evidence of pericardial effusion. Mitral Valve: The mitral valve is normal in structure. No evidence of mitral valve regurgitation. No evidence of mitral valve stenosis. Tricuspid Valve: The tricuspid valve is normal in structure. Tricuspid valve regurgitation is trivial. Aortic Valve: The aortic valve is tricuspid. Aortic valve regurgitation is not visualized. No aortic stenosis is present. Pulmonic Valve: The pulmonic valve was normal in structure. Pulmonic valve regurgitation is not visualized. Aorta: The  aortic root is normal in size and structure. Venous: The inferior vena  cava is normal in size with greater than 50% respiratory variability, suggesting right atrial pressure of 3 mmHg. IAS/Shunts: No atrial level shunt detected by color flow Doppler.  LEFT VENTRICLE PLAX 2D LVIDd:         4.10 cm Diastology LVIDs:         2.70 cm LV e' medial:    6.20 cm/s LV PW:         1.30 cm LV E/e' medial:  8.0 LV IVS:        1.30 cm LV e' lateral:   11.60 cm/s                        LV E/e' lateral: 4.2  RIGHT VENTRICLE RV S prime:     13.90 cm/s LEFT ATRIUM           Index LA diam:      3.50 cm 2.12 cm/m LA Vol (A4C): 35.4 ml 21.48 ml/m   AORTA Ao Root diam: 2.60 cm MITRAL VALVE               TRICUSPID VALVE MV Area (PHT): 2.95 cm    TR Peak grad:   21.3 mmHg MV Decel Time: 257 msec    TR Vmax:        231.00 cm/s MV E velocity: 49.30 cm/s MV Ikenna Ohms velocity: 73.30 cm/s MV E/Lennard Capek ratio:  0.67 Dalton McleanMD Electronically signed by Wilfred Lacy Signature Date/Time: 03/19/2023/3:56:13 PM    Final    MR BRAIN WO CONTRAST  Result Date: 03/18/2023 CLINICAL DATA:  Altered mental status, speech difficulty EXAM: MRI HEAD WITHOUT CONTRAST TECHNIQUE: Multiplanar, multiecho pulse sequences of the brain and surrounding structures were obtained without intravenous contrast. COMPARISON:  No prior MRI available, correlation is made with CT head 03/18/2023 FINDINGS: Brain: No restricted diffusion to suggest acute or subacute infarct. No acute hemorrhage, mass, mass effect, or midline shift. No hydrocephalus or extra-axial collection. Normal pituitary and craniocervical junction. No hemosiderin deposition to suggest remote hemorrhage. Moderately advanced cerebral atrophy for age peer confluent T2 hyperintense signal in the periventricular white matter, likely the sequela of moderate to severe chronic small vessel ischemic disease. Vascular: Normal arterial flow voids. Skull and upper cervical spine: Normal marrow signal. Sinuses/Orbits:  Clear paranasal sinuses. No acute finding in the orbits. Other: Trace fluid in right mastoid air cells. IMPRESSION: No acute intracranial process. No evidence of acute or subacute infarct. Electronically Signed   By: Wiliam Ke M.D.   On: 03/18/2023 13:13   CT Head Wo Contrast  Result Date: 03/18/2023 CLINICAL DATA:  Altered mental status EXAM: CT HEAD WITHOUT CONTRAST TECHNIQUE: Contiguous axial images were obtained from the base of the skull through the vertex without intravenous contrast. RADIATION DOSE REDUCTION: This exam was performed according to the departmental dose-optimization program which includes automated exposure control, adjustment of the mA and/or kV according to patient size and/or use of iterative reconstruction technique. COMPARISON:  07/28/2019 FINDINGS: Brain: No evidence of acute infarction, hemorrhage, hydrocephalus, extra-axial collection or mass lesion/mass effect. Subcortical white matter and periventricular small vessel ischemic changes. Global cortical atrophy. Vascular: Mild intracranial atherosclerosis. Skull: Normal. Negative for fracture or focal lesion. Sinuses/Orbits: The visualized paranasal sinuses are essentially clear. The mastoid air cells are unopacified. Other: None. IMPRESSION: No acute intracranial abnormality. Atrophy with small vessel ischemic changes. Electronically Signed   By: Charline Bills M.D.   On: 03/18/2023 03:53        Scheduled Meds:  amLODipine  10 mg Oral Daily   enoxaparin (LOVENOX) injection  40 mg Subcutaneous Q24H   hydrALAZINE  25 mg Oral Q8H   insulin aspart  0-5 Units Subcutaneous QHS   insulin aspart  0-6 Units Subcutaneous TID WC   irbesartan  300 mg Oral Daily   metoprolol tartrate  25 mg Oral BID   pantoprazole  40 mg Oral Daily   rosuvastatin  2.5 mg Oral QHS   sodium chloride flush  3 mL Intravenous Q12H   Continuous Infusions:  sodium chloride 100 mL/hr at 03/18/23 1820     LOS: 0 days    Time spent: over 30  min    Lacretia Nicks, MD [facility]   To contact the attending provider between 7A-7P or the covering provider during after hours 7P-7A, please log into the web site [email or web address] and access using universal [place] password for that web site. If you do not have the password, please call the hospital operator.  03/19/2023, 5:01 PM

## 2023-03-19 NOTE — Evaluation (Signed)
Physical Therapy One Time Evaluation Patient Details Name: Cindy Rhodes MRN: [id number] DOB: 10-04-1946 Today's Date: 03/19/2023  History of Present Illness  Pt is a 77 y.o. female presenting to [facility] ED with 2 day history of elevated blood pressure, fatigue, loss of appetite, difficulty with speech, and balance. CT head negative for acute intracranial abnormality.  Labs are notable for sodium 126, potassium 2.9, and magnesium 1.6. Treated with IV magnesium, potassium, and labetalol. PMH significant for HTN, DM II, and depression.  Clinical Impression  Patient evaluated by Physical Therapy with no further acute PT needs identified. All education has been completed and the patient has no further questions.  Pt used bathroom modified independently and then ambulated in hallway.  Pt reports her balance does not yet feel at baseline and she held hand rail in hallway (also does have SPC to use at home if needed).  Pt would like to follow up with physical therapy upon d/c for higher level balance evaluation and treatment.  Pt agreeable to continue to mobilize in acute setting with staff. See below for any follow-up Physical Therapy or equipment needs. PT is signing off. Thank you for this referral.        Recommendations for follow up therapy are one component of a multi-disciplinary discharge planning process, led by the attending physician.  Recommendations may be updated based on patient status, additional functional criteria and insurance authorization.  Follow Up Recommendations       Assistance Recommended at Discharge PRN  Patient can return home with the following       Equipment Recommendations None recommended by PT  Recommendations for Other Services       Functional Status Assessment Patient has had a recent decline in their functional status and demonstrates the ability to make significant improvements in function in a reasonable and predictable amount of time.     Precautions /  Restrictions Precautions Precautions: Fall      Mobility  Bed Mobility Overal bed mobility: Independent                  Transfers Overall transfer level: Needs assistance Equipment used: None Transfers: Sit to/from Stand Sit to Stand: Supervision                Ambulation/Gait Ambulation/Gait assistance: Supervision Gait Distance (Feet): 400 Feet   Gait Pattern/deviations: Step-through pattern, Decreased stride length Gait velocity: decr     General Gait Details: pt holding hallway handrail throughout ambulation for more security (fearful of falling)  Stairs            Wheelchair Mobility    Modified Rankin (Stroke Patients Only)       Balance Overall balance assessment: Mild deficits observed, not formally tested                                           Pertinent Vitals/Pain Pain Assessment Pain Assessment: No/denies pain    Home Living Family/patient expects to be discharged to:: Other (Comment) (Independnet living at [facility] [address])     Type of Home: Independent living facility           Home Equipment: [employer] - single point Additional Comments: staff provides cleaning, meals in dining room. no stairs to enter. Apartment on second floor, but has elevator. Walk in shower with grab bars, no seat. Handicapped height toilet.  Prior Function Prior Level of Function : Independent/Modified Independent             Mobility Comments: no AD ADLs Comments: husband drives. indep in medication management for both herself and her husband. Husband does the finances.     Hand Dominance   Dominant Hand: Left    Extremity/Trunk Assessment        Lower Extremity Assessment Lower Extremity Assessment: Overall WFL for tasks assessed    Cervical / Trunk Assessment Cervical / Trunk Assessment: Normal  Communication   Communication: Expressive difficulties (Intermittent word finding difficulty.  Reports this is predominantly back to her baseline (has mild difficulty at baseline))  Cognition Arousal/Alertness: Awake/alert Behavior During Therapy: WFL for tasks assessed/performed Overall Cognitive Status: Impaired/Different from baseline                                 General Comments: pt very talkative, cognitive deficits per OT evaluation        General Comments General comments (skin integrity, edema, etc.): Pt denies any falls within the last 6 months.  Pt reports most recent falls wear outside of her home.    Exercises     Assessment/Plan    PT Assessment All further PT needs can be met in the next venue of care  PT Problem List Decreased balance       PT Treatment Interventions      PT Goals (Current goals can be found in the Care Plan section)  Acute Rehab PT Goals PT Goal Formulation: All assessment and education complete, DC therapy    Frequency       Co-evaluation               AM-PAC PT "6 Clicks" Mobility  Outcome Measure Help needed turning from your back to your side while in a flat bed without using bedrails?: None Help needed moving from lying on your back to sitting on the side of a flat bed without using bedrails?: None Help needed moving to and from a bed to a chair (including a wheelchair)?: A Little Help needed standing up from a chair using your arms (e.g., wheelchair or bedside chair)?: A Little Help needed to walk in hospital room?: A Little Help needed climbing 3-5 steps with a railing? : A Little 6 Click Score: 20    End of Session   Activity Tolerance: Patient tolerated treatment well Patient left: in bed;with call bell/phone within reach;with family/visitor present   PT Visit Diagnosis: Difficulty in walking, not elsewhere classified (R26.2)    Time: 9147-8295 PT Time Calculation (min) (ACUTE ONLY): 25 min   Charges:   PT Evaluation $PT Eval Low Complexity: 1 Low        Kati PT, DPT Physical  Therapist Acute Rehabilitation Services Office: [phone]   Janan Halter [place] 03/19/2023, 2:50 PM

## 2023-03-19 NOTE — Evaluation (Signed)
Occupational Therapy Evaluation Patient Details Name: Cindy Rhodes MRN: [id number] DOB: [birth year] Today's Date: 03/19/2023   History of Present Illness Pt is a 76 y.o. female presenting to [facility] ED with 2 day history of elevated blood pressure, fatigue, loss of appetite, difficulty with speech, and balance. CT head negative for acute intracranial abnormality.  Labs are notable for sodium 126, potassium 2.9, and magnesium 1.6. Treated with IV magnesium, potassium, and labetalol. PMH significant for HTN, DM II, and depression.   Clinical Impression   PTA, pt lived with her husband in independent living at [facility]. Pt was independent in ADL, light housekeeping, and medication management. Upon eval, Pt performing ADL with up to supervision A. Pt with decreased memory, problem solving, and word finding. Pt with awareness of experiencing difficulty during cognitive testing, but after testing, reporting "I think I did really well. Pt scoring a 15 on the Short Blessed Test indicating impairment consistent with dementia. Pt reporting her thinking feels near baseline, but open to OP OT follow up to ensure safe transition into her executive function tasks. Husband also present in session and advised to supervise IADL/medication management when first returning home.      Recommendations for follow up therapy are one component of a multi-disciplinary discharge planning process, led by the attending physician.  Recommendations may be updated based on patient status, additional functional criteria and insurance authorization.   Assistance Recommended at Discharge Intermittent Supervision/Assistance  Patient can return home with the following Direct supervision/assist for medications management;Direct supervision/assist for financial management;Assist for transportation    Functional Status Assessment  Patient has had a recent decline in their functional status and demonstrates the ability to make  significant improvements in function in a reasonable and predictable amount of time.  Equipment Recommendations  None recommended by OT    Recommendations for Other Services Speech consult (cog/language)     Precautions / Restrictions Precautions Precautions: Fall      Mobility Bed Mobility Overal bed mobility: Independent                  Transfers Overall transfer level: Needs assistance Equipment used: None Transfers: Sit to/from Stand, Bed to chair/wheelchair/BSC Sit to Stand: Modified independent (Device/Increase time), Supervision     Step pivot transfers: Supervision, Modified independent (Device/Increase time)     General transfer comment: Transfers at supervision to mod I level. Supervision predominantly due to pt with significant fear of falling.      Balance Overall balance assessment: Mild deficits observed, not formally tested                                         ADL either performed or assessed with clinical judgement   ADL Overall ADL's : Needs assistance/impaired Eating/Feeding: Independent;Sitting   Grooming: Standing;Supervision/safety Grooming Details (indicate cue type and reason): at sink. No LOB. One cue to problem solve while looking for hand towel. Upper Body Bathing: Sitting;Modified independent   Lower Body Bathing: Sitting/lateral leans;Sit to/from stand;Modified independent   Upper Body Dressing : Sitting;Modified independent   Lower Body Dressing: Sit to/from stand;Sitting/lateral leans;Modified independent   Toilet Transfer: Supervision/safety;Ambulation   Toileting- Clothing Manipulation and Hygiene: Modified independent;Sitting/lateral lean   Tub/ Shower Transfer: Walk-in shower;Supervision/safety;Ambulation Tub/Shower Transfer Details (indicate cue type and reason): Pt and husband report no lip to step over "0 entry" shower at home Functional mobility during ADLs:  Supervision/safety General ADL  Comments: Pt also able to retrieve item from floor in room to simulate home environment safety and clean up to the extent that she performs at home.     Vision Baseline Vision/History: 1 Wears glasses Ability to See in Adequate Light: 0 Adequate Patient Visual Report: No change from baseline Vision Assessment?: No apparent visual deficits Additional Comments: Pt able to read board and therapist's name tag. Denies visual changes.     Perception Perception Perception Tested?: No   Praxis Praxis Praxis tested?: Within functional limits    Pertinent Vitals/Pain Pain Assessment Pain Assessment: Faces Pain Score: 0-No pain     Hand Dominance Left   Extremity/Trunk Assessment Upper Extremity Assessment Upper Extremity Assessment: Overall WFL for tasks assessed   Lower Extremity Assessment Lower Extremity Assessment: Defer to PT evaluation   Cervical / Trunk Assessment Cervical / Trunk Assessment: Normal   Communication Communication Communication: Expressive difficulties (Intermittent word finding difficulty. Reports this is predominantly back to her baseline (has mild difficulty at baseline))   Cognition Arousal/Alertness: Awake/alert Behavior During Therapy: WFL for tasks assessed/performed Overall Cognitive Status: Impaired/Different from baseline Area of Impairment: Memory, Safety/judgement, Awareness, Problem solving                     Memory: Decreased short-term memory   Safety/Judgement: Decreased awareness of safety, Decreased awareness of deficits (decr awareness of cognitive difficulties stating "I think I passed" after cognitive testing. Also, fear of falling more significant than actual risk) Awareness: Intellectual, Emergent, Anticipatory (fair foresight, bowever, when reflecting on cognitive tasks, recalls that she did much better than she did in reality) Problem Solving: Requires verbal cues, Slow processing General Comments: Pt scoring a 15 on the  short blessed test with difficulty estibating time of day (believed it was 6:00 PM but 8:30AM). Difficulty counting backward, stating months of year in reverse order. Difficulty with delayed memory recall. Not oriented to day of week (guessing thursday), but when cued "+1", able to be redirected to friday. Very pleasant and conversational. Tangential and very elaborate in descriptions when sked about home or PLOF, but suspect being relatively verbose is this pt's baseline.     General Comments  VSS. Husband present. Pt likes to go by "bunny"    Exercises     Shoulder Instructions      Home Living Family/patient expects to be discharged to:: Other (Comment) (Independnet living at [facility] apartments)                                 Additional Comments: staff provides cleaning, meals in dining room. no stairs to enter. Apartment on second floor, but has elevator. Walk in shower with grab bars, no seat. Handicapped height toilet.      Prior Functioning/Environment Prior Level of Function : Independent/Modified Independent             Mobility Comments: no AD ADLs Comments: husband drives. indep in medication management for both herself and her husband. Husband does the finances.        OT Problem List: Decreased activity tolerance;Impaired balance (sitting and/or standing);Decreased safety awareness;Decreased cognition      OT Treatment/Interventions: Self-care/ADL training;Therapeutic exercise;Balance training;Patient/family education;Cognitive remediation/compensation;Therapeutic activities;DME and/or AE instruction    OT Goals(Current goals can be found in the care plan section) Acute Rehab OT Goals Patient Stated Goal: go home OT Goal Formulation: With patient/family Time For Goal  Achievement: 04/02/23 Potential to Achieve Goals: Good  OT Frequency: Min 2X/week    Co-evaluation              AM-PAC OT "6 Clicks" Daily Activity     Outcome  Measure Help from another person eating meals?: None Help from another person taking care of personal grooming?: None Help from another person toileting, which includes using toliet, bedpan, or urinal?: A Little Help from another person bathing (including washing, rinsing, drying)?: A Little Help from another person to put on and taking off regular upper body clothing?: None Help from another person to put on and taking off regular lower body clothing?: A Little 6 Click Score: 21   End of Session Nurse Communication: Mobility status  Activity Tolerance: Patient tolerated treatment well Patient left: in chair;with call bell/phone within reach;with chair alarm set  OT Visit Diagnosis: Other symptoms and signs involving cognitive function;Unsteadiness on feet (R26.81)                Time: 1610-9604 OT Time Calculation (min): 42 min Charges:  OT General Charges $OT Visit: 1 Visit OT Evaluation $OT Eval Low Complexity: 1 Low OT Treatments $Self Care/Home Management : 23-37 mins  Tyler Deis, OTR/L [facility] Acute Rehabilitation Office: [phone]   Myrla Halsted 03/19/2023, 9:19 AM

## 2023-03-20 DIAGNOSIS — I169 Hypertensive crisis, unspecified: Secondary | ICD-10-CM | POA: Diagnosis not present

## 2023-03-20 LAB — CBC
HCT: 33 % — ABNORMAL LOW (ref 36.0–46.0)
Hemoglobin: 11.5 g/dL — ABNORMAL LOW (ref 12.0–15.0)
MCH: 29.7 pg (ref 26.0–34.0)
MCHC: 34.8 g/dL (ref 30.0–36.0)
MCV: 85.3 fL (ref 80.0–100.0)
Platelets: 229 10*3/uL (ref 150–400)
RBC: 3.87 MIL/uL (ref 3.87–5.11)
RDW: 13.7 % (ref 11.5–15.5)
WBC: 7.1 10*3/uL (ref 4.0–10.5)
nRBC: 0 % (ref 0.0–0.2)

## 2023-03-20 LAB — GLUCOSE, CAPILLARY
Glucose-Capillary: 123 mg/dL — ABNORMAL HIGH (ref 70–99)
Glucose-Capillary: 316 mg/dL — ABNORMAL HIGH (ref 70–99)
Glucose-Capillary: 191 mg/dL — ABNORMAL HIGH (ref 70–99)
Glucose-Capillary: 169 mg/dL — ABNORMAL HIGH (ref 70–99)

## 2023-03-20 LAB — ACTH STIMULATION, 3 TIME POINTS
Cortisol, 30 Min: 35.4 ug/dL
Cortisol, 60 Min: 40.6 ug/dL
Cortisol, Base: 9.8 ug/dL

## 2023-03-20 LAB — BASIC METABOLIC PANEL
Anion gap: 8 (ref 5–15)
BUN: 11 mg/dL (ref 8–23)
CO2: 20 mmol/L — ABNORMAL LOW (ref 22–32)
Calcium: 8.7 mg/dL — ABNORMAL LOW (ref 8.9–10.3)
Chloride: 102 mmol/L (ref 98–111)
Creatinine, Ser: 0.86 mg/dL (ref 0.44–1.00)
GFR, Estimated: >60 mL/min (ref >60)
Glucose, Bld: 125 mg/dL — ABNORMAL HIGH (ref 70–99)
Potassium: 3.6 mmol/L (ref 3.5–5.1)
Sodium: 130 mmol/L — ABNORMAL LOW (ref 135–145)

## 2023-03-20 NOTE — Progress Notes (Signed)
Mobility Specialist - Progress Note   03/20/23 1358  Mobility  Activity Ambulated independently in hallway  Level of Assistance Independent  Assistive Device None  Distance Ambulated (ft) 700 ft  Activity Response Tolerated well  Mobility Referral Yes  $Mobility charge 1 Mobility   Pt received in recliner and agreeable to mobility. No complaints during session. Pt to recliner for meal after session with all needs met.     [facility]

## 2023-03-20 NOTE — Progress Notes (Signed)
PROGRESS NOTE    Cindy Rhodes  ZOX:[id number] DOB: 05-30-1946 DOA: 03/18/2023 PCP: Renford Dills, MD  Chief Complaint  Patient presents with   Hypertension    Brief Narrative:    Cindy Rhodes is Cindy Rhodes 77 y.o. female with medical history significant for hypertension, type 2 diabetes mellitus, and depression who presents to the emergency department for evaluation of elevated blood pressure, fatigue, loss of appetite, and difficulty with her speech and balance.   Patient reports that the aforementioned symptoms began 2 days ago and have persisted.  She denies abdominal pain or nausea but has not felt like eating anything for the past 2 days.  She has had trouble with her speech described as difficulty finding the right words to use, and she also reports difficulty ambulating the past 2 days due to balance.  Her husband states that she slept all day yesterday which is unusual for her.    She had not taken her blood pressure medications for the past 2 days but, noting her blood pressure to be severely elevated, took Micardis and metoprolol shortly prior to arrival   She denies any preceding fall or trauma and denies focal numbness or weakness, fever, chills, dysuria, abdominal pain, chest pain, or cough.   ED Course: Upon arrival to the ED, patient is found to be afebrile and saturating well on normal heart rate and severely elevated she demonstrates sinus rhythm with QTc 537.  Head CT is negative for acute intracranial abnormality.  Labs are notable for sodium 126, potassium 2.9, and magnesium 1.6.   Was treated with IV magnesium, oral and IV potassium, and IV labetalol in the ED.  Assessment & Plan:   Principal Problem:   Hypertensive crisis Active Problems:   Hypokalemia   Hyponatremia   Type 2 diabetes mellitus with other specified complication   Acute encephalopathy   Hypomagnesemia   Prolonged QT interval  1. Acute encephalopathy  Ambulatory Difficulties - Presents with 2 days  of word-finding and balance difficulty   - No acute findings on head CT  - MRI brain without acute intracranial process - overall improved today - normal b12, folate, TSH - UA not concerning for UTI - she's improved now, suspect this is related to significantly elevated BP at presentation   # Hyponatremia - appears euvolemic.  Her sodium worsened on NS.   - apparently drinks 750 ml water at least 3 times Cindy Rhodes day.  Eats about 2 meals Cindy Rhodes day.  - relatively low urine osm - this may be tea/toast picture - she's improved on fluid restriction - continue to hold SSRI for now - normal TSH.  Pending ACTH stim test.  2. Hypertensive urgency  - BP as high as 220/110 in ED   - Continue home medications (metoprolol, ARB, amlodipine) - will start hydralazine 25 mg q8 hrs - with her low potassium and significant hypertension, follow renin/aldo levels (pending) - BP improving overall - IV labetalol prn    3. Hypokalemia; hypomagnesemia   Replace and follow    5. Type II DM  - Hold metformin, check CBGs, and use low-intensity SSI for now     6. Prolonged QT interval  - QTc is 537 ms in ED -> 477  - Replace potassium and magnesium, avoid QT-prolonging medications    PT/OT evals    DVT prophylaxis: lovenox Code Status: full Family Communication: none Disposition:   Status is: Observation The patient remains OBS appropriate and will d/c before 2 midnights.  Consultants:  noen  Procedures:  Echo IMPRESSIONS     1. Left ventricular ejection fraction, by estimation, is 55 to 60%. The  left ventricle has normal function. The left ventricle has no regional  wall motion abnormalities. There is mild concentric left ventricular  hypertrophy. Left ventricular diastolic  parameters are consistent with Grade I diastolic dysfunction (impaired  relaxation).   2. Right ventricular systolic function is normal. The right ventricular  size is normal. There is normal pulmonary artery systolic  pressure. The  estimated right ventricular systolic pressure is 24.3 mmHg.   3. Left atrial size was mildly dilated.   4. The mitral valve is normal in structure. No evidence of mitral valve  regurgitation. No evidence of mitral stenosis.   5. The aortic valve is tricuspid. Aortic valve regurgitation is not  visualized. No aortic stenosis is present.   6. The inferior vena cava is normal in size with greater than 50%  respiratory variability, suggesting right atrial pressure of 3 mmHg.   Antimicrobials:  Anti-infectives (From admission, onward)    None       Subjective: Feeling progressively better  Objective: Vitals:   03/19/23 2014 03/20/23 0534 03/20/23 0940 03/20/23 1441  BP: (!) 165/86 (!) 172/88 (!) 168/81 139/75  Pulse: 73 81  69  Resp: 18 16  20   Temp: 98.5 F (36.9 C) 98.3 F (36.8 C)  (!) 97.5 F (36.4 C)  TempSrc: Oral Oral  Oral  SpO2: 100% 99%  100%  Weight:      Height:        Intake/Output Summary (Last 24 hours) at 03/20/2023 1448 Last data filed at 03/20/2023 1251 Gross per 24 hour  Intake 840 ml  Output 1600 ml  Net -760 ml   Filed Weights   03/18/23 0608 03/19/23 0500  Weight: 61.2 kg 64 kg    Examination:  General: No acute distress. Moist mucous membranes Cardiovascular: RRR Lungs: unlabored Abdomen: Soft, nontender, nondistended  Neurological: Alert and oriented 3. Moves all extremities 4 with equal strength. Cranial nerves II through XII grossly intact. Extremities: No clubbing or cyanosis. No edema. Normal skin turgor.   Data Reviewed: I have personally reviewed following labs and imaging studies  CBC: Recent Labs  Lab 03/18/23 0300 03/19/23 0402 03/20/23 0946  WBC 9.8 8.7 7.1  NEUTROABS 7.6  --   --   HGB 12.2 11.1* 11.5*  HCT 34.2* 31.9* 33.0*  MCV 82.8 84.2 85.3  PLT 265 242 229    Basic Metabolic Panel: Recent Labs  Lab 03/18/23 0300 03/18/23 1325 03/19/23 0402 03/19/23 0848 03/19/23 1953 03/20/23 0946  NA  126* 124* 125* 126* 123* 130*  K 2.9* 3.6 3.4* 3.4* 4.0 3.6  CL 90* 94* 95* 94* 95* 102  CO2 23 21* 21* 21* 21* 20*  GLUCOSE 139* 157* 123* 139* 196* 125*  BUN 13 11 12 13 12 11   CREATININE 0.81 0.80 0.93 0.95 0.87 0.86  CALCIUM 8.9 8.9 8.4* 8.8* 8.4* 8.7*  MG 1.6*  --  2.0  --   --   --     GFR: Estimated Creatinine Clearance: 49.7 mL/min (by C-G formula based on SCr of 0.86 mg/dL).  Liver Function Tests: Recent Labs  Lab 03/18/23 0300  AST 29  ALT 20  ALKPHOS 51  BILITOT 1.2  PROT 7.8  ALBUMIN 4.5    CBG: Recent Labs  Lab 03/19/23 1147 03/19/23 1724 03/19/23 2148 03/20/23 0800 03/20/23 1232  GLUCAP 189* 120* 212* 123*  316*     No results found for this or any previous visit (from the past 240 hour(s)).       Radiology Studies: DG CHEST PORT 1 VIEW  Result Date: 03/19/2023 CLINICAL DATA:  Hyponatremia EXAM: PORTABLE CHEST 1 VIEW COMPARISON:  Radiographs 04/02/2014 FINDINGS: The heart size and mediastinal contours are within normal limits. Both lungs are clear. The visualized skeletal structures are unremarkable. IMPRESSION: No active disease. Electronically Signed   By: Minerva Fester M.D.   On: 03/19/2023 23:31   ECHOCARDIOGRAM COMPLETE  Result Date: 03/19/2023    ECHOCARDIOGRAM REPORT   Patient Name:   VANESS JELINSKI Trotta Date of Exam: 03/19/2023 Medical Rec #:  [id number]      Height:       62.0 in Accession #:    [id number]     Weight:       141.1 lb Date of Birth:  [birth year]       BSA:          1.648 m Patient Age:    76 years       BP:           164/86 mmHg Patient Gender: F              HR:           64 bpm. Exam Location:  Inpatient Procedure: 2D Echo, Color Doppler, Cardiac Doppler and Intracardiac            Opacification Agent Indications:    R00.8 Other abnormalities of heart beat  History:        Patient has no prior history of Echocardiogram examinations.                 Risk Factors:Diabetes and Hypertension.  Sonographer:    Milbert Coulter Referring Phys:  [phone] Josiephine Simao CALDWELL POWELL JR  Sonographer Comments: Suboptimal apical window. Dense breast tissue. IMPRESSIONS  1. Left ventricular ejection fraction, by estimation, is 55 to 60%. The left ventricle has normal function. The left ventricle has no regional wall motion abnormalities. There is mild concentric left ventricular hypertrophy. Left ventricular diastolic parameters are consistent with Grade I diastolic dysfunction (impaired relaxation).  2. Right ventricular systolic function is normal. The right ventricular size is normal. There is normal pulmonary artery systolic pressure. The estimated right ventricular systolic pressure is 24.3 mmHg.  3. Left atrial size was mildly dilated.  4. The mitral valve is normal in structure. No evidence of mitral valve regurgitation. No evidence of mitral stenosis.  5. The aortic valve is tricuspid. Aortic valve regurgitation is not visualized. No aortic stenosis is present.  6. The inferior vena cava is normal in size with greater than 50% respiratory variability, suggesting right atrial pressure of 3 mmHg. FINDINGS  Left Ventricle: Left ventricular ejection fraction, by estimation, is 55 to 60%. The left ventricle has normal function. The left ventricle has no regional wall motion abnormalities. Definity contrast agent was given IV to delineate the left ventricular  endocardial borders. The left ventricular internal cavity size was normal in size. There is mild concentric left ventricular hypertrophy. Left ventricular diastolic parameters are consistent with Grade I diastolic dysfunction (impaired relaxation). Right Ventricle: The right ventricular size is normal. No increase in right ventricular wall thickness. Right ventricular systolic function is normal. There is normal pulmonary artery systolic pressure. The tricuspid regurgitant velocity is 2.31 m/s, and  with an assumed right atrial pressure of 3 mmHg, the estimated right ventricular systolic pressure is  24.3 mmHg. Left  Atrium: Left atrial size was mildly dilated. Right Atrium: Right atrial size was normal in size. Pericardium: There is no evidence of pericardial effusion. Mitral Valve: The mitral valve is normal in structure. No evidence of mitral valve regurgitation. No evidence of mitral valve stenosis. Tricuspid Valve: The tricuspid valve is normal in structure. Tricuspid valve regurgitation is trivial. Aortic Valve: The aortic valve is tricuspid. Aortic valve regurgitation is not visualized. No aortic stenosis is present. Pulmonic Valve: The pulmonic valve was normal in structure. Pulmonic valve regurgitation is not visualized. Aorta: The aortic root is normal in size and structure. Venous: The inferior vena cava is normal in size with greater than 50% respiratory variability, suggesting right atrial pressure of 3 mmHg. IAS/Shunts: No atrial level shunt detected by color flow Doppler.  LEFT VENTRICLE PLAX 2D LVIDd:         4.10 cm Diastology LVIDs:         2.70 cm LV e' medial:    6.20 cm/s LV PW:         1.30 cm LV E/e' medial:  8.0 LV IVS:        1.30 cm LV e' lateral:   11.60 cm/s                        LV E/e' lateral: 4.2  RIGHT VENTRICLE RV S prime:     13.90 cm/s LEFT ATRIUM           Index LA diam:      3.50 cm 2.12 cm/m LA Vol (A4C): 35.4 ml 21.48 ml/m   AORTA Ao Root diam: 2.60 cm MITRAL VALVE               TRICUSPID VALVE MV Area (PHT): 2.95 cm    TR Peak grad:   21.3 mmHg MV Decel Time: 257 msec    TR Vmax:        231.00 cm/s MV E velocity: 49.30 cm/s MV Melvyn Hommes velocity: 73.30 cm/s MV E/Niralya Ohanian ratio:  0.67 Dalton McleanMD Electronically signed by Wilfred Lacy Signature Date/Time: 03/19/2023/3:56:13 PM    Final         Scheduled Meds:  amLODipine  10 mg Oral Daily   enoxaparin (LOVENOX) injection  40 mg Subcutaneous Q24H   hydrALAZINE  25 mg Oral Q8H   insulin aspart  0-5 Units Subcutaneous QHS   insulin aspart  0-6 Units Subcutaneous TID WC   irbesartan  300 mg Oral Daily   metoprolol tartrate  25 mg Oral  BID   pantoprazole  40 mg Oral Daily   rosuvastatin  2.5 mg Oral QHS   sodium chloride flush  3 mL Intravenous Q12H   Continuous Infusions:     LOS: 1 day    Time spent: over 30 min    Lacretia Nicks, MD [facility]   To contact the attending provider between 7A-7P or the covering provider during after hours 7P-7A, please log into the web site [email or web address] and access using universal  password for that web site. If you do not have the password, please call the hospital operator.  03/20/2023, 2:48 PM

## 2023-03-21 DIAGNOSIS — I169 Hypertensive crisis, unspecified: Secondary | ICD-10-CM | POA: Diagnosis not present

## 2023-03-21 LAB — CBC
HCT: 33.6 % — ABNORMAL LOW (ref 36.0–46.0)
Hemoglobin: 11.2 g/dL — ABNORMAL LOW (ref 12.0–15.0)
MCH: 29.4 pg (ref 26.0–34.0)
MCHC: 33.3 g/dL (ref 30.0–36.0)
MCV: 88.2 fL (ref 80.0–100.0)
Platelets: 226 10*3/uL (ref 150–400)
RBC: 3.81 MIL/uL — ABNORMAL LOW (ref 3.87–5.11)
RDW: 13.9 % (ref 11.5–15.5)
WBC: 7.7 10*3/uL (ref 4.0–10.5)
nRBC: 0 % (ref 0.0–0.2)

## 2023-03-21 LAB — GLUCOSE, CAPILLARY
Glucose-Capillary: 137 mg/dL — ABNORMAL HIGH (ref 70–99)
Glucose-Capillary: 148 mg/dL — ABNORMAL HIGH (ref 70–99)
Glucose-Capillary: 146 mg/dL — ABNORMAL HIGH (ref 70–99)
Glucose-Capillary: 262 mg/dL — ABNORMAL HIGH (ref 70–99)

## 2023-03-21 LAB — MAGNESIUM: Magnesium: 1.9 mg/dL (ref 1.7–2.4)

## 2023-03-21 LAB — BASIC METABOLIC PANEL
Anion gap: 9 (ref 5–15)
BUN: 17 mg/dL (ref 8–23)
CO2: 18 mmol/L — ABNORMAL LOW (ref 22–32)
Calcium: 8.6 mg/dL — ABNORMAL LOW (ref 8.9–10.3)
Chloride: 103 mmol/L (ref 98–111)
Creatinine, Ser: 0.94 mg/dL (ref 0.44–1.00)
GFR, Estimated: >60 mL/min (ref >60)
Glucose, Bld: 114 mg/dL — ABNORMAL HIGH (ref 70–99)
Potassium: 3.1 mmol/L — ABNORMAL LOW (ref 3.5–5.1)
Sodium: 130 mmol/L — ABNORMAL LOW (ref 135–145)

## 2023-03-21 MED ORDER — POTASSIUM CHLORIDE CRYS ER 20 MEQ PO TBCR: 30.0000 meq | EXTENDED_RELEASE_TABLET | Freq: Every day | ORAL | Status: DC

## 2023-03-21 MED ORDER — POTASSIUM CHLORIDE CRYS ER 20 MEQ PO TBCR
40.0000 meq | EXTENDED_RELEASE_TABLET | ORAL | Status: AC
  Administered 2023-03-21 (×2): 40 meq via ORAL
  Filled 2023-03-21 (×2): qty 2

## 2023-03-21 MED ORDER — POTASSIUM CHLORIDE CRYS ER 20 MEQ PO TBCR
30.0000 meq | EXTENDED_RELEASE_TABLET | Freq: Every day | ORAL | Status: DC
  Administered 2023-03-22: 30 meq via ORAL
  Filled 2023-03-21: qty 1

## 2023-03-21 MED ORDER — HYDRALAZINE HCL 50 MG PO TABS
50.0000 mg | ORAL_TABLET | Freq: Three times a day (TID) | ORAL | Status: DC
  Administered 2023-03-21 – 2023-03-22 (×3): 50 mg via ORAL
  Filled 2023-03-21 (×3): qty 1

## 2023-03-21 MED ORDER — HYDRALAZINE HCL 25 MG PO TABS
25.0000 mg | ORAL_TABLET | Freq: Once | ORAL | Status: AC
  Administered 2023-03-21: 25 mg via ORAL
  Filled 2023-03-21: qty 1

## 2023-03-21 NOTE — Progress Notes (Signed)
Occupational Therapy Treatment Patient Details Name: Cindy Rhodes MRN: [id number] DOB: [birth year] Today's Date: 03/21/2023   History of present illness Pt is a 77 y.o. female presenting to [facility] ED with 2 day history of elevated blood pressure, fatigue, loss of appetite, difficulty with speech, and balance. CT head negative for acute intracranial abnormality.  Labs are notable for sodium 126, potassium 2.9, and magnesium 1.6. Treated with IV magnesium, potassium, and labetalol. PMH significant for HTN, DM II, and depression.   OT comments  Pt willing to participate with OT this morning. Husband supportive   Recommendations for follow up therapy are one component of a multi-disciplinary discharge planning process, led by the attending physician.  Recommendations may be updated based on patient status, additional functional criteria and insurance authorization.    Assistance Recommended at Discharge Intermittent Supervision/Assistance  Patient can return home with the following  Direct supervision/assist for medications management;Direct supervision/assist for financial management;Assist for transportation   Equipment Recommendations       Recommendations for Other Services      Precautions / Restrictions Precautions Precautions: Fall       Mobility Bed Mobility Overal bed mobility: Needs Assistance Bed Mobility: Supine to Sit     Supine to sit: Min guard          Transfers Overall transfer level: Needs assistance Equipment used: 1 person hand held assist Transfers: Sit to/from Stand Sit to Stand: Min guard Stand pivot transfers: Min assist                   ADL either performed or assessed with clinical judgement   ADL       Grooming: Standing;Supervision/safety;Wash/dry Lawyer: Min guard;Ambulation;Regular Toilet   Toileting- Clothing Manipulation and Hygiene: Modified independent;Sitting/lateral lean          General ADL Comments: huasband present for OT session.    Extremity/Trunk Assessment Upper Extremity Assessment Upper Extremity Assessment: Generalized weakness             Cognition Arousal/Alertness: Awake/alert Behavior During Therapy: WFL for tasks assessed/performed Overall Cognitive Status: Impaired/Different from baseline Area of Impairment: Memory, Safety/judgement, Awareness, Problem solving                               General Comments: pt very talkative, cognitive deficits per OT evaluation                   Pertinent Vitals/ Pain       Pain Assessment Pain Assessment: No/denies pain         Frequency  Min 2X/week        Progress Toward Goals  OT Goals(current goals can now be found in the care plan section)  Progress towards OT goals: Progressing toward goals  Acute Rehab OT Goals OT Goal Formulation: With patient/family Potential to Achieve Goals: Good  Plan Discharge plan remains appropriate       AM-PAC OT "6 Clicks" Daily Activity     Outcome Measure   Help from another person eating meals?: None Help from another person taking care of personal grooming?: None Help from another person toileting, which includes using toliet, bedpan, or urinal?: A Little Help from another person bathing (including washing, rinsing, drying)?: A Little Help from another person to put on and taking off regular upper  body clothing?: None Help from another person to put on and taking off regular lower body clothing?: A Little 6 Click Score: 21    End of Session    OT Visit Diagnosis: Other symptoms and signs involving cognitive function;Unsteadiness on feet (R26.81)   Activity Tolerance Patient tolerated treatment well   Patient Left in chair;with call bell/phone within reach;with chair alarm set;with family/visitor present   Nurse Communication Mobility status        Time: 1020-1048 OT Time Calculation (min): 28 min  Charges: OT  Treatments $Self Care/Home Management : 23-37 mins  Lise Auer, OT Acute Rehabilitation Services  [phone]     Jeanie Sewer, Karin Golden D 03/21/2023, 11:30 AM

## 2023-03-21 NOTE — Progress Notes (Signed)
PROGRESS NOTE    Cindy Rhodes  ZOX:[id number] DOB: 10/28/1946 DOA: 03/18/2023 PCP: Renford Dills, MD  Chief Complaint  Patient presents with   Hypertension    Brief Narrative:    Cindy Rhodes is Cindy Rhodes 77 y.o. female with medical history significant for hypertension, type 2 diabetes mellitus, and depression who presents to the emergency department for evaluation of elevated blood pressure, fatigue, loss of appetite, and difficulty with her speech and balance.   Patient reports that the aforementioned symptoms began 2 days ago and have persisted.  She denies abdominal pain or nausea but has not felt like eating anything for the past 2 days.  She has had trouble with her speech described as difficulty finding the right words to use, and she also reports difficulty ambulating the past 2 days due to balance.  Her husband states that she slept all day yesterday which is unusual for her.    She had not taken her blood pressure medications for the past 2 days but, noting her blood pressure to be severely elevated, took Micardis and metoprolol shortly prior to arrival   She denies any preceding fall or trauma and denies focal numbness or weakness, fever, chills, dysuria, abdominal pain, chest pain, or cough.   ED Course: Upon arrival to the ED, patient is found to be afebrile and saturating well on normal heart rate and severely elevated she demonstrates sinus rhythm with QTc 537.  Head CT is negative for acute intracranial abnormality.  Labs are notable for sodium 126, potassium 2.9, and magnesium 1.6.   Was treated with IV magnesium, oral and IV potassium, and IV labetalol in the ED.  Assessment & Plan:   Principal Problem:   Hypertensive crisis Active Problems:   Hypokalemia   Hyponatremia   Type 2 diabetes mellitus with other specified complication   Acute encephalopathy   Hypomagnesemia   Prolonged QT interval  1. Acute encephalopathy  Ambulatory Difficulties - Presents with 2 days  of word-finding and balance difficulty   - No acute findings on head CT  - MRI brain without acute intracranial process - overall improved today - normal b12, folate, TSH - UA not concerning for UTI - she's improved now, suspect this is related to significantly elevated BP at presentation   # Hyponatremia - appears euvolemic.  Her sodium worsened on NS.   - apparently drinks 750 ml water at least 3 times Cindy Gervasi day.  Eats about 2 meals Cindy Rhodes day.  - relatively low urine osm - this may be tea/toast picture - she's improved on fluid restriction - continue to hold SSRI for now - normal TSH.  Normal ACTH stim test  2. Hypertensive urgency  - BP as high as 220/110 in ED   - Continue home medications (metoprolol, ARB, amlodipine) - uptitrate hydral as tolerated - with her low potassium and significant hypertension, follow renin/aldo levels (pending) - renal artery doppler pending - BP still rather high this AM, 188 systolic  - IV labetalol prn  - watch for overcorrection   3. Hypokalemia; hypomagnesemia   Replace and follow    5. Type II DM  - Hold metformin, check CBGs, and use low-intensity SSI for now     6. Prolonged QT interval  - QTc is 537 ms in ED -> 477  - Replace potassium and magnesium, avoid QT-prolonging medications    PT/OT evals    DVT prophylaxis: lovenox Code Status: full Family Communication: none Disposition:   Status is: Observation  The patient remains OBS appropriate and will d/c before 2 midnights.   Consultants:  noen  Procedures:  Echo IMPRESSIONS     1. Left ventricular ejection fraction, by estimation, is 55 to 60%. The  left ventricle has normal function. The left ventricle has no regional  wall motion abnormalities. There is mild concentric left ventricular  hypertrophy. Left ventricular diastolic  parameters are consistent with Grade I diastolic dysfunction (impaired  relaxation).   2. Right ventricular systolic function is normal. The right  ventricular  size is normal. There is normal pulmonary artery systolic pressure. The  estimated right ventricular systolic pressure is 24.3 mmHg.   3. Left atrial size was mildly dilated.   4. The mitral valve is normal in structure. No evidence of mitral valve  regurgitation. No evidence of mitral stenosis.   5. The aortic valve is tricuspid. Aortic valve regurgitation is not  visualized. No aortic stenosis is present.   6. The inferior vena cava is normal in size with greater than 50%  respiratory variability, suggesting right atrial pressure of 3 mmHg.   Antimicrobials:  Anti-infectives (From admission, onward)    None       Subjective: No new complaints  Objective: Vitals:   03/21/23 0432 03/21/23 0600 03/21/23 1035 03/21/23 1400  BP: (!) 188/75 (!) 173/78 (!) 150/90 137/73  Pulse: 69 65  66  Resp: 16   (!) 22  Temp: 98 F (36.7 C)   97.8 F (36.6 C)  TempSrc: Oral   Oral  SpO2: 100%   99%  Weight: 62.5 kg     Height:        Intake/Output Summary (Last 24 hours) at 03/21/2023 1524 Last data filed at 03/21/2023 1414 Gross per 24 hour  Intake 718 ml  Output 400 ml  Net 318 ml   Filed Weights   03/18/23 0608 03/19/23 0500 03/21/23 0432  Weight: 61.2 kg 64 kg 62.5 kg    Examination:  General: No acute distress. Cardiovascular: RRR Lungs: unlabored Abdomen: Soft, nontender, nondistended  Neurological: Alert and oriented 3. Moves all extremities 4 with equal strength. Cranial nerves II through XII grossly intact. Extremities: No clubbing or cyanosis. No edema.  Data Reviewed: I have personally reviewed following labs and imaging studies  CBC: Recent Labs  Lab 03/18/23 0300 03/19/23 0402 03/20/23 0946 03/21/23 0423  WBC 9.8 8.7 7.1 7.7  NEUTROABS 7.6  --   --   --   HGB 12.2 11.1* 11.5* 11.2*  HCT 34.2* 31.9* 33.0* 33.6*  MCV 82.8 84.2 85.3 88.2  PLT 265 242 229 226    Basic Metabolic Panel: Recent Labs  Lab 03/18/23 0300 03/18/23 1325  03/19/23 0402 03/19/23 0848 03/19/23 1953 03/20/23 0946 03/21/23 0423  NA 126*   < > 125* 126* 123* 130* 130*  K 2.9*   < > 3.4* 3.4* 4.0 3.6 3.1*  CL 90*   < > 95* 94* 95* 102 103  CO2 23   < > 21* 21* 21* 20* 18*  GLUCOSE 139*   < > 123* 139* 196* 125* 114*  BUN 13   < > 12 13 12 11 17   CREATININE 0.81   < > 0.93 0.95 0.87 0.86 0.94  CALCIUM 8.9   < > 8.4* 8.8* 8.4* 8.7* 8.6*  MG 1.6*  --  2.0  --   --   --  1.9   < > = values in this interval not displayed.    GFR: Estimated Creatinine  Clearance: 45 mL/min (by C-G formula based on SCr of 0.94 mg/dL).  Liver Function Tests: Recent Labs  Lab 03/18/23 0300  AST 29  ALT 20  ALKPHOS 51  BILITOT 1.2  PROT 7.8  ALBUMIN 4.5    CBG: Recent Labs  Lab 03/20/23 1232 03/20/23 1654 03/20/23 2025 03/21/23 0800 03/21/23 1124  GLUCAP 316* 191* 169* 137* 148*     No results found for this or any previous visit (from the past 240 hour(s)).       Radiology Studies: DG CHEST PORT 1 VIEW  Result Date: 03/19/2023 CLINICAL DATA:  Hyponatremia EXAM: PORTABLE CHEST 1 VIEW COMPARISON:  Radiographs 04/02/2014 FINDINGS: The heart size and mediastinal contours are within normal limits. Both lungs are clear. The visualized skeletal structures are unremarkable. IMPRESSION: No active disease. Electronically Signed   By: Minerva Fester M.D.   On: 03/19/2023 23:31   ECHOCARDIOGRAM COMPLETE  Result Date: 03/19/2023    ECHOCARDIOGRAM REPORT   Patient Name:   Cindy Rhodes Date of Exam: 03/19/2023 Medical Rec #:  [id number]      Height:       62.0 in Accession #:    [id number]     Weight:       141.1 lb Date of Birth:  1945/12/20       BSA:          1.648 m Patient Age:    76 years       BP:           164/86 mmHg Patient Gender: F              HR:           64 bpm. Exam Location:  Inpatient Procedure: 2D Echo, Color Doppler, Cardiac Doppler and Intracardiac            Opacification Agent Indications:    R00.8 Other abnormalities of heart beat   History:        Patient has no prior history of Echocardiogram examinations.                 Risk Factors:Diabetes and Hypertension.  Sonographer:    Milbert Coulter Referring Phys: [phone] Avrielle Fry CALDWELL POWELL JR  Sonographer Comments: Suboptimal apical window. Dense breast tissue. IMPRESSIONS  1. Left ventricular ejection fraction, by estimation, is 55 to 60%. The left ventricle has normal function. The left ventricle has no regional wall motion abnormalities. There is mild concentric left ventricular hypertrophy. Left ventricular diastolic parameters are consistent with Grade I diastolic dysfunction (impaired relaxation).  2. Right ventricular systolic function is normal. The right ventricular size is normal. There is normal pulmonary artery systolic pressure. The estimated right ventricular systolic pressure is 24.3 mmHg.  3. Left atrial size was mildly dilated.  4. The mitral valve is normal in structure. No evidence of mitral valve regurgitation. No evidence of mitral stenosis.  5. The aortic valve is tricuspid. Aortic valve regurgitation is not visualized. No aortic stenosis is present.  6. The inferior vena cava is normal in size with greater than 50% respiratory variability, suggesting right atrial pressure of 3 mmHg. FINDINGS  Left Ventricle: Left ventricular ejection fraction, by estimation, is 55 to 60%. The left ventricle has normal function. The left ventricle has no regional wall motion abnormalities. Definity contrast agent was given IV to delineate the left ventricular  endocardial borders. The left ventricular internal cavity size was normal in size. There is mild concentric left ventricular hypertrophy. Left ventricular  diastolic parameters are consistent with Grade I diastolic dysfunction (impaired relaxation). Right Ventricle: The right ventricular size is normal. No increase in right ventricular wall thickness. Right ventricular systolic function is normal. There is normal pulmonary artery systolic  pressure. The tricuspid regurgitant velocity is 2.31 m/s, and  with an assumed right atrial pressure of 3 mmHg, the estimated right ventricular systolic pressure is 24.3 mmHg. Left Atrium: Left atrial size was mildly dilated. Right Atrium: Right atrial size was normal in size. Pericardium: There is no evidence of pericardial effusion. Mitral Valve: The mitral valve is normal in structure. No evidence of mitral valve regurgitation. No evidence of mitral valve stenosis. Tricuspid Valve: The tricuspid valve is normal in structure. Tricuspid valve regurgitation is trivial. Aortic Valve: The aortic valve is tricuspid. Aortic valve regurgitation is not visualized. No aortic stenosis is present. Pulmonic Valve: The pulmonic valve was normal in structure. Pulmonic valve regurgitation is not visualized. Aorta: The aortic root is normal in size and structure. Venous: The inferior vena cava is normal in size with greater than 50% respiratory variability, suggesting right atrial pressure of 3 mmHg. IAS/Shunts: No atrial level shunt detected by color flow Doppler.  LEFT VENTRICLE PLAX 2D LVIDd:         4.10 cm Diastology LVIDs:         2.70 cm LV e' medial:    6.20 cm/s LV PW:         1.30 cm LV E/e' medial:  8.0 LV IVS:        1.30 cm LV e' lateral:   11.60 cm/s                        LV E/e' lateral: 4.2  RIGHT VENTRICLE RV S prime:     13.90 cm/s LEFT ATRIUM           Index LA diam:      3.50 cm 2.12 cm/m LA Vol (A4C): 35.4 ml 21.48 ml/m   AORTA Ao Root diam: 2.60 cm MITRAL VALVE               TRICUSPID VALVE MV Area (PHT): 2.95 cm    TR Peak grad:   21.3 mmHg MV Decel Time: 257 msec    TR Vmax:        231.00 cm/s MV E velocity: 49.30 cm/s MV Chick Cousins velocity: 73.30 cm/s MV E/Burnard Enis ratio:  0.67 Dalton McleanMD Electronically signed by Wilfred Lacy Signature Date/Time: 03/19/2023/3:56:13 PM    Final         Scheduled Meds:  amLODipine  10 mg Oral Daily   enoxaparin (LOVENOX) injection  40 mg Subcutaneous Q24H   hydrALAZINE   50 mg Oral Q8H   insulin aspart  0-5 Units Subcutaneous QHS   insulin aspart  0-6 Units Subcutaneous TID WC   irbesartan  300 mg Oral Daily   metoprolol tartrate  25 mg Oral BID   pantoprazole  40 mg Oral Daily   rosuvastatin  2.5 mg Oral QHS   sodium chloride flush  3 mL Intravenous Q12H   Continuous Infusions:     LOS: 2 days    Time spent: over 30 min    Lacretia Nicks, MD [facility]   To contact the attending provider between 7A-7P or the covering provider during after hours 7P-7A, please log into the web site [email or web address] and access using universal Lindsay password for that web site. If you do not have the password,  please call the hospital operator.  03/21/2023, 3:24 PM

## 2023-03-22 ENCOUNTER — Inpatient Hospital Stay (HOSPITAL_COMMUNITY): Payer: PPO

## 2023-03-22 DIAGNOSIS — I10 Essential (primary) hypertension: Secondary | ICD-10-CM | POA: Diagnosis not present

## 2023-03-22 DIAGNOSIS — I169 Hypertensive crisis, unspecified: Secondary | ICD-10-CM | POA: Diagnosis not present

## 2023-03-22 LAB — COMPREHENSIVE METABOLIC PANEL
ALT: 29 U/L (ref 0–44)
AST: 32 U/L (ref 15–41)
Albumin: 4.1 g/dL (ref 3.5–5.0)
Alkaline Phosphatase: 50 U/L (ref 38–126)
Anion gap: 9 (ref 5–15)
BUN: 21 mg/dL (ref 8–23)
CO2: 20 mmol/L — ABNORMAL LOW (ref 22–32)
Calcium: 9 mg/dL (ref 8.9–10.3)
Chloride: 102 mmol/L (ref 98–111)
Creatinine, Ser: 0.79 mg/dL (ref 0.44–1.00)
GFR, Estimated: >60 mL/min (ref >60)
Glucose, Bld: 139 mg/dL — ABNORMAL HIGH (ref 70–99)
Potassium: 3.7 mmol/L (ref 3.5–5.1)
Sodium: 131 mmol/L — ABNORMAL LOW (ref 135–145)
Total Bilirubin: 0.5 mg/dL (ref 0.3–1.2)
Total Protein: 6.8 g/dL (ref 6.5–8.1)

## 2023-03-22 LAB — CBC WITH DIFFERENTIAL/PLATELET
Abs Immature Granulocytes: 0.05 10*3/uL (ref 0.00–0.07)
Basophils Absolute: 0.1 10*3/uL (ref 0.0–0.1)
Basophils Relative: 1 %
Eosinophils Absolute: 0.3 10*3/uL (ref 0.0–0.5)
Eosinophils Relative: 3 %
HCT: 34.9 % — ABNORMAL LOW (ref 36.0–46.0)
Hemoglobin: 11.9 g/dL — ABNORMAL LOW (ref 12.0–15.0)
Immature Granulocytes: 1 %
Lymphocytes Relative: 11 %
Lymphs Abs: 1.2 10*3/uL (ref 0.7–4.0)
MCH: 29.5 pg (ref 26.0–34.0)
MCHC: 34.1 g/dL (ref 30.0–36.0)
MCV: 86.4 fL (ref 80.0–100.0)
Monocytes Absolute: 1.1 10*3/uL — ABNORMAL HIGH (ref 0.1–1.0)
Monocytes Relative: 10 %
Neutro Abs: 8.2 10*3/uL — ABNORMAL HIGH (ref 1.7–7.7)
Neutrophils Relative %: 74 %
Platelets: 235 10*3/uL (ref 150–400)
RBC: 4.04 MIL/uL (ref 3.87–5.11)
RDW: 13.9 % (ref 11.5–15.5)
WBC: 10.9 10*3/uL — ABNORMAL HIGH (ref 4.0–10.5)
nRBC: 0 % (ref 0.0–0.2)

## 2023-03-22 LAB — MAGNESIUM: Magnesium: 1.8 mg/dL (ref 1.7–2.4)

## 2023-03-22 LAB — GLUCOSE, CAPILLARY
Glucose-Capillary: 146 mg/dL — ABNORMAL HIGH (ref 70–99)
Glucose-Capillary: 227 mg/dL — ABNORMAL HIGH (ref 70–99)

## 2023-03-22 LAB — PHOSPHORUS: Phosphorus: 3.9 mg/dL (ref 2.5–4.6)

## 2023-03-22 MED ORDER — HYDRALAZINE HCL 25 MG PO TABS
37.5000 mg | ORAL_TABLET | Freq: Three times a day (TID) | ORAL | Status: DC
  Administered 2023-03-22: 37.5 mg via ORAL
  Filled 2023-03-22: qty 2

## 2023-03-22 MED ORDER — AMLODIPINE BESYLATE 5 MG PO TABS: 10.0000 mg | ORAL_TABLET | Freq: Every day | ORAL | 1 refills | Status: DC

## 2023-03-22 MED ORDER — POTASSIUM CHLORIDE ER 15 MEQ PO TBCR: 30.0000 meq | EXTENDED_RELEASE_TABLET | Freq: Every day | ORAL | 0 refills | Status: DC

## 2023-03-22 MED ORDER — HYDRALAZINE HCL 25 MG PO TABS: 37.5000 mg | ORAL_TABLET | Freq: Three times a day (TID) | ORAL | 1 refills | Status: DC

## 2023-03-22 MED ORDER — IBUPROFEN 200 MG PO TABS: 200.0000 mg | ORAL_TABLET | Freq: Four times a day (QID) | ORAL | Status: DC | PRN

## 2023-03-22 MED ORDER — IBUPROFEN 200 MG PO TABS: 600.0000 mg | ORAL_TABLET | Freq: Two times a day (BID) | ORAL | 0 refills | Status: DC | PRN

## 2023-03-22 NOTE — Discharge Summary (Addendum)
Physician Discharge Summary  Cindy Rhodes ZOX:[id number] DOB: [birth year] DOA: 03/18/2023  PCP: Renford Dills, MD  Admit date: 03/18/2023 Discharge date: 03/22/2023  Time spent: 40 minutes  Recommendations for Outpatient Follow-up:  Follow outpatient CBC/CMP  Follow repeat labs within 1 week for sodium/potassium creatinine follow up  Follow with renal outpatient with hypertension and electrolyte abnormalities Renin and aldosterone levels pending Follow with neurology outpatient with concern for cognitive deficit   Discharge Diagnoses:  Principal Problem:   Hypertensive crisis Active Problems:   Hypokalemia   Hyponatremia   Type 2 diabetes mellitus with other specified complication   Acute encephalopathy   Hypomagnesemia   Prolonged QT interval   Discharge Condition: stable  Diet recommendation: heart healthy  Filed Weights   03/19/23 0500 03/21/23 0432 03/22/23 0500  Weight: 64 kg 62.5 kg 61.8 kg    History of present illness:   Cindy Rhodes is Cindy Rhodes 77 y.o. female with medical history significant for hypertension, type 2 diabetes mellitus, and depression who presents to the emergency department for evaluation of elevated blood pressure, fatigue, loss of appetite, and difficulty with her speech and balance.   She was admitted and treated for acute metabolic encephalopathy due to presumed hypertensive emergency and hyponatremia.    Her encephalopathy has improved with management of her blood pressure.  Her hyponatremia has improved with fluid restriction.  See below for additional details, plan for neurology and nephrology follow up outpatient   Hospital Course:  Assessment and Plan:  1. Acute encephalopathy  Ambulatory Difficulties  Concern for Cognitive Deficit - Presents with 2 days of word-finding and balance difficulty   - No acute findings on head CT  - MRI brain without acute intracranial process - overall improved today - normal b12, folate, TSH - UA not  concerning for UTI - did poorly on short blessed on 4/19 with OT - will arrange follow up with neurology outpatient for cognitive evaluation - she's improved now, suspect this is related to significantly elevated BP at presentation    # Hyponatremia - appears euvolemic.  Her sodium worsened on NS.   - apparently drinks 750 ml water at least 3 times Xaivier Malay day.  Eats about 2 meals Jenniah Bhavsar day.  - discharge home with fluid restriction - relatively low urine osm - this may be tea/toast picture - she's improved on fluid restriction - ok to resume zoloft on discharge, if issues outpatient, would discontinue  - normal TSH.  Normal ACTH stim test   2. Hypertensive urgency  - Continue home medications (metoprolol, ARB, amlodipine) - amlodipine increased 10 mg daily, continue metoprolol and telmisartan - hydralazine 37.5 mg TID (reduced from 50 due to lower BP this AM, want to avoid overshooting - her orthostatics were negative, able to walk around unit without issue) - with her low potassium and significant hypertension, follow renin/aldo levels (pending) - renal artery doppler with 1-59% stenosis of R renal artery and 1-59% stenosis of L renal artery - discharge home with plan to follow BP outpatient, renal referral outpatient    3. Hypokalemia; hypomagnesemia   Wnl    5. Type II DM  - A1c 5.8, follow outpatient on metformin    6. Prolonged QT interval  - QTc is 537 ms in ED -> 477  - Replace potassium and magnesium, avoid QT-prolonging medications   - follow outpatient     Procedures: Echo IMPRESSIONS     1. Left ventricular ejection fraction, by estimation, is 55 to 60%. The  left ventricle has normal function. The left ventricle has no regional  wall motion abnormalities. There is mild concentric left ventricular  hypertrophy. Left ventricular diastolic  parameters are consistent with Grade I diastolic dysfunction (impaired  relaxation).   2. Right ventricular systolic function is  normal. The right ventricular  size is normal. There is normal pulmonary artery systolic pressure. The  estimated right ventricular systolic pressure is 24.3 mmHg.   3. Left atrial size was mildly dilated.   4. The mitral valve is normal in structure. No evidence of mitral valve  regurgitation. No evidence of mitral stenosis.   5. The aortic valve is tricuspid. Aortic valve regurgitation is not  visualized. No aortic stenosis is present.   6. The inferior vena cava is normal in size with greater than 50%  respiratory variability, suggesting right atrial pressure of 3 mmHg.    Summary:  Renal:    Right: Normal size right kidney. Normal right Resisitive Index.         1-59% stenosis of the right renal artery.  Left:  Normal size of left kidney. Normal left Resistive Index.         1-59% stenosis of the left renal artery.   Consultations: none  Discharge Exam: Vitals:   03/22/23 0429 03/22/23 1106  BP: (!) 172/81 (!) 103/58  Pulse: 91   Resp: 20   Temp: 97.6 F (36.4 C)   SpO2: 100%    No new complaints Husband at bedside Discussed with son over phone  General: No acute distress. Cardiovascular: RRR Lungs: unlabored Abdomen: Soft, nontender, nondistended  Neurological: Alert and oriented 3. Moves all extremities 4 with equal strength. Cranial nerves II through XII grossly intact. Extremities: No clubbing or cyanosis. No edema.   Discharge Instructions   Discharge Instructions     Ambulatory referral to Neurology   Complete by: As directed    An appointment is requested in approximately: 2 weeks   Ambulatory referral to Physical Therapy   Complete by: As directed    Call MD for:  difficulty breathing, headache or visual disturbances   Complete by: As directed    Call MD for:  extreme fatigue   Complete by: As directed    Call MD for:  hives   Complete by: As directed    Call MD for:  persistant dizziness or light-headedness   Complete by: As directed    Call  MD for:  persistant nausea and vomiting   Complete by: As directed    Call MD for:  redness, tenderness, or signs of infection (pain, swelling, redness, odor or green/yellow discharge around incision site)   Complete by: As directed    Call MD for:  severe uncontrolled pain   Complete by: As directed    Call MD for:  temperature >100.4   Complete by: As directed    Diet - low sodium heart healthy   Complete by: As directed    Discharge instructions   Complete by: As directed    You were seen with confusion and significantly high blood pressures.  You're currently back to your baseline.  I think your significantly high blood pressures probably caused the issues with confusion for you.  We'll send you to neurology outpatient for additional cognitive evaluation.  Your blood pressure was significantly elevated.  It's not clear why this changed suddenly.  We've increased your amlodipine and added Shakia Sebastiano new medicine hydralazine.  Continue this as prescribed.  Watch for lightheadedness with your new  regimen.  Change positions slowly and if you notice issues with lightheadedness or dizziness, hold the hydralazine and check your blood pressure (and call your doctor to ask about next steps).  Your sodium has improved with fluid restriction.  Try to limit your fluids to 800 ml Javaeh Muscatello day.  Repeat your labs with your PCP within the next week.  I'm ok with you resuming your zoloft for now, but if you have recurrent issues, please hold and discuss resumption with your PCP.  We'll ask Dr. Valentino Nose' nephrology group to arrange Juandavid Dallman follow up appointment for you.  You have pending labs at discharge.  You can follow this with your PCP outpatient.  Your renin and aldosterone levels are pending.  Return for new, recurrent, or worsening symptoms.  Please ask your PCP to request records from this hospitalization so they know what was done and what the next steps will be.   Increase activity slowly   Complete by: As  directed       Allergies as of 03/22/2023       Reactions   Hydrochlorothiazide Other (See Comments)   Unknown    Meloxicam Other (See Comments)   Dizzy and nervous    Pork-derived Products Other (See Comments)   Religious    Sulfa Antibiotics Other (See Comments)   Shuts down urinary system        Medication List     STOP taking these medications    cephALEXin 500 MG capsule Commonly known as: KEFLEX       TAKE these medications    amLODipine 5 MG tablet Commonly known as: NORVASC Take 2 tablets (10 mg total) by mouth daily. What changed: how much to take   Calcium Carbonate-Vitamin D 600-400 MG-UNIT tablet Take 1 tablet by mouth daily.   CO Q 10 PO Take 1 capsule by mouth daily.   Crestor 5 MG tablet Generic drug: rosuvastatin Take 2.5 mg by mouth at bedtime.   famotidine 20 MG tablet Commonly known as: PEPCID Take 20 mg by mouth daily.   glucosamine-chondroitin 500-400 MG tablet Take 3 tablets by mouth daily.   hydrALAZINE 25 MG tablet Commonly known as: APRESOLINE Take 1.5 tablets (37.5 mg total) by mouth every 8 (eight) hours.   ibuprofen 200 MG tablet Commonly known as: ADVIL Take 3 tablets (600 mg total) by mouth 2 (two) times daily as needed. Please use this only as needed What changed:  when to take this reasons to take this additional instructions   metFORMIN 500 MG 24 hr tablet Commonly known as: GLUCOPHAGE-XR Take 1,000 mg by mouth 2 (two) times daily.   metoprolol tartrate 25 MG tablet Commonly known as: LOPRESSOR Take 25 mg by mouth 2 (two) times daily.   multivitamin with minerals Tabs tablet Take 1 tablet by mouth daily.   oxybutynin 10 MG 24 hr tablet Commonly known as: DITROPAN-XL Take 10 mg by mouth daily.   Potassium Chloride ER 15 MEQ Tbcr Take 2 tablets (30 mEq total) by mouth daily. Please repeat labs with your PCP within 1 week to check your potassium and your sodium Start taking on: March 23, 2023 What  changed:  medication strength how much to take additional instructions   sertraline 50 MG tablet Commonly known as: ZOLOFT Take 75 mg by mouth daily.   Tart Cherry Advanced Caps Take 1 capsule by mouth daily.   telmisartan 80 MG tablet Commonly known as: MICARDIS Take 1 tablet (80 mg total) by mouth daily.  Vitamin C 500 MG Caps Take 500 mg by mouth daily.       Allergies  Allergen Reactions   Hydrochlorothiazide Other (See Comments)    Unknown    Meloxicam Other (See Comments)    Dizzy and nervous    Pork-Derived Products Other (See Comments)    Religious    Sulfa Antibiotics Other (See Comments)    Shuts down urinary system       The results of significant diagnostics from this hospitalization (including imaging, microbiology, ancillary and laboratory) are listed below for reference.    Significant Diagnostic Studies: VAS US RENAL ARTERY DUPLEX  Result Date: 03/22/2023 ABDOMINAL VISCERAL Patient Name:  Cindy Rhodes  Date of Exam:   03/22/2023 Medical Rec #: [id number]       Accession #:    [id number] Date of Birth: 1946/04/06        Patient Gender: F Patient Age:   28 years Exam Location:  [facility] Procedure:      VAS US RENAL ARTERY DUPLEX Referring Phys: [id number] Sakura Denis CALDWELL POWELL JR -------------------------------------------------------------------------------- Indications: Hypertension High Risk Factors: Hypertension, Diabetes. Limitations: Air/bowel gas. Comparison Study: No prior studies. Performing Technologist: Chanda Busing RVT  Examination Guidelines: Kavin Weckwerth complete evaluation includes B-mode imaging, spectral Doppler, color Doppler, and power Doppler as needed of all accessible portions of each vessel. Bilateral testing is considered an integral part of Oscar Forman complete examination. Limited examinations for reoccurring indications may be performed as noted.  Duplex Findings: +--------------------+--------+--------+------+--------+ Mesenteric          PSV  cm/sEDV cm/sPlaqueComments +--------------------+--------+--------+------+--------+ Aorta Mid             109                          +--------------------+--------+--------+------+--------+ Celiac Artery Origin   71                          +--------------------+--------+--------+------+--------+ SMA Proximal           92      8                   +--------------------+--------+--------+------+--------+    +------------------+--------+--------+-------+ Right Renal ArteryPSV cm/sEDV cm/sComment +------------------+--------+--------+-------+ Origin               74      14           +------------------+--------+--------+-------+ Proximal            122      23           +------------------+--------+--------+-------+ Mid                 223      58           +------------------+--------+--------+-------+ Distal              182      42           +------------------+--------+--------+-------+ +-----------------+--------+--------+-------+ Left Renal ArteryPSV cm/sEDV cm/sComment +-----------------+--------+--------+-------+ Origin              49      8            +-----------------+--------+--------+-------+ Proximal            50      11           +-----------------+--------+--------+-------+ Mid  64      12           +-----------------+--------+--------+-------+ Distal              20      8            +-----------------+--------+--------+-------+ +------------+--------+--------+----+-----------+--------+--------+----+ Right KidneyPSV cm/sEDV cm/sRI  Left KidneyPSV cm/sEDV cm/sRI   +------------+--------+--------+----+-----------+--------+--------+----+ Upper Pole  19      5       0.74Upper Pole 19      6       0.70 +------------+--------+--------+----+-----------+--------+--------+----+ Mid         27      7       0.        23      4       0.81  +------------+--------+--------+----+-----------+--------+--------+----+ Lower Pole  29      8       0.71Lower Pole 21      6       0.72 +------------+--------+--------+----+-----------+--------+--------+----+ Hilar       33      7       0.80Hilar      58      14      0.76 +------------+--------+--------+----+-----------+--------+--------+----+ +------------------+----+------------------+----+ Right Kidney          Left Kidney            +------------------+----+------------------+----+ RAR                   RAR                    +------------------+----+------------------+----+ RAR (manual)      2.05RAR (manual)      0.59 +------------------+----+------------------+----+ Cortex                Cortex                 +------------------+----+------------------+----+ Cortex thickness      Corex thickness        +------------------+----+------------------+----+ Kidney length (cm)9.50Kidney length (cm)9.00 +------------------+----+------------------+----+   Summary: Renal:  Right: Normal size right kidney. Normal right Resisitive Index.        1-59% stenosis of the right renal artery. Left:  Normal size of left kidney. Normal left Resistive Index.        1-59% stenosis of the left renal artery.  *See table(s) above for measurements and observations.     Preliminary    DG CHEST PORT 1 VIEW  Result Date: 03/19/2023 CLINICAL DATA:  Hyponatremia EXAM: PORTABLE CHEST 1 VIEW COMPARISON:  Radiographs 04/02/2014 FINDINGS: The heart size and mediastinal contours are within normal limits. Both lungs are clear. The visualized skeletal structures are unremarkable. IMPRESSION: No active disease. Electronically Signed   By: Minerva Fester M.D.   On: 03/19/2023 23:31   ECHOCARDIOGRAM COMPLETE  Result Date: 03/19/2023    ECHOCARDIOGRAM REPORT   Patient Name:   Cindy Rhodes Date of Exam: 03/19/2023 Medical Rec #:  [id number]      Height:       62.0 in Accession #:    [id number]      Weight:       141.1 lb Date of Birth:  December 08, 1945       BSA:          1.648 m Patient Age:    76 years       BP:           164/86 mmHg Patient  Gender: F              HR:           64 bpm. Exam Location:  Inpatient Procedure: 2D Echo, Color Doppler, Cardiac Doppler and Intracardiac            Opacification Agent Indications:    R00.8 Other abnormalities of heart beat  History:        Patient has no prior history of Echocardiogram examinations.                 Risk Factors:Diabetes and Hypertension.  Sonographer:    Milbert Coulter Referring Phys: [phone] Laxmi Choung CALDWELL POWELL JR  Sonographer Comments: Suboptimal apical window. Dense breast tissue. IMPRESSIONS  1. Left ventricular ejection fraction, by estimation, is 55 to 60%. The left ventricle has normal function. The left ventricle has no regional wall motion abnormalities. There is mild concentric left ventricular hypertrophy. Left ventricular diastolic parameters are consistent with Grade I diastolic dysfunction (impaired relaxation).  2. Right ventricular systolic function is normal. The right ventricular size is normal. There is normal pulmonary artery systolic pressure. The estimated right ventricular systolic pressure is 24.3 mmHg.  3. Left atrial size was mildly dilated.  4. The mitral valve is normal in structure. No evidence of mitral valve regurgitation. No evidence of mitral stenosis.  5. The aortic valve is tricuspid. Aortic valve regurgitation is not visualized. No aortic stenosis is present.  6. The inferior vena cava is normal in size with greater than 50% respiratory variability, suggesting right atrial pressure of 3 mmHg. FINDINGS  Left Ventricle: Left ventricular ejection fraction, by estimation, is 55 to 60%. The left ventricle has normal function. The left ventricle has no regional wall motion abnormalities. Definity contrast agent was given IV to delineate the left ventricular  endocardial borders. The left ventricular internal cavity size was normal  in size. There is mild concentric left ventricular hypertrophy. Left ventricular diastolic parameters are consistent with Grade I diastolic dysfunction (impaired relaxation). Right Ventricle: The right ventricular size is normal. No increase in right ventricular wall thickness. Right ventricular systolic function is normal. There is normal pulmonary artery systolic pressure. The tricuspid regurgitant velocity is 2.31 m/s, and  with an assumed right atrial pressure of 3 mmHg, the estimated right ventricular systolic pressure is 24.3 mmHg. Left Atrium: Left atrial size was mildly dilated. Right Atrium: Right atrial size was normal in size. Pericardium: There is no evidence of pericardial effusion. Mitral Valve: The mitral valve is normal in structure. No evidence of mitral valve regurgitation. No evidence of mitral valve stenosis. Tricuspid Valve: The tricuspid valve is normal in structure. Tricuspid valve regurgitation is trivial. Aortic Valve: The aortic valve is tricuspid. Aortic valve regurgitation is not visualized. No aortic stenosis is present. Pulmonic Valve: The pulmonic valve was normal in structure. Pulmonic valve regurgitation is not visualized. Aorta: The aortic root is normal in size and structure. Venous: The inferior vena cava is normal in size with greater than 50% respiratory variability, suggesting right atrial pressure of 3 mmHg. IAS/Shunts: No atrial level shunt detected by color flow Doppler.  LEFT VENTRICLE PLAX 2D LVIDd:         4.10 cm Diastology LVIDs:         2.70 cm LV e' medial:    6.20 cm/s LV PW:         1.30 cm LV E/e' medial:  8.0 LV IVS:        1.30 cm LV  e' lateral:   11.60 cm/s                        LV E/e' lateral: 4.2  RIGHT VENTRICLE RV S prime:     13.90 cm/s LEFT ATRIUM           Index LA diam:      3.50 cm 2.12 cm/m LA Vol (A4C): 35.4 ml 21.48 ml/m   AORTA Ao Root diam: 2.60 cm MITRAL VALVE               TRICUSPID VALVE MV Area (PHT): 2.95 cm    TR Peak grad:   21.3 mmHg  MV Decel Time: 257 msec    TR Vmax:        231.00 cm/s MV E velocity: 49.30 cm/s MV Patrina Andreas velocity: 73.30 cm/s MV E/Daysie Helf ratio:  0.67 Dalton McleanMD Electronically signed by Wilfred Lacy Signature Date/Time: 03/19/2023/3:56:13 PM    Final    MR BRAIN WO CONTRAST  Result Date: 03/18/2023 CLINICAL DATA:  Altered mental status, speech difficulty EXAM: MRI HEAD WITHOUT CONTRAST TECHNIQUE: Multiplanar, multiecho pulse sequences of the brain and surrounding structures were obtained without intravenous contrast. COMPARISON:  No prior MRI available, correlation is made with CT head 03/18/2023 FINDINGS: Brain: No restricted diffusion to suggest acute or subacute infarct. No acute hemorrhage, mass, mass effect, or midline shift. No hydrocephalus or extra-axial collection. Normal pituitary and craniocervical junction. No hemosiderin deposition to suggest remote hemorrhage. Moderately advanced cerebral atrophy for age peer confluent T2 hyperintense signal in the periventricular white matter, likely the sequela of moderate to severe chronic small vessel ischemic disease. Vascular: Normal arterial flow voids. Skull and upper cervical spine: Normal marrow signal. Sinuses/Orbits: Clear paranasal sinuses. No acute finding in the orbits. Other: Trace fluid in right mastoid air cells. IMPRESSION: No acute intracranial process. No evidence of acute or subacute infarct. Electronically Signed   By: Wiliam Ke M.D.   On: 03/18/2023 13:13   CT Head Wo Contrast  Result Date: 03/18/2023 CLINICAL DATA:  Altered mental status EXAM: CT HEAD WITHOUT CONTRAST TECHNIQUE: Contiguous axial images were obtained from the base of the skull through the vertex without intravenous contrast. RADIATION DOSE REDUCTION: This exam was performed according to the departmental dose-optimization program which includes automated exposure control, adjustment of the mA and/or kV according to patient size and/or use of iterative reconstruction technique.  COMPARISON:  07/28/2019 FINDINGS: Brain: No evidence of acute infarction, hemorrhage, hydrocephalus, extra-axial collection or mass lesion/mass effect. Subcortical white matter and periventricular small vessel ischemic changes. Global cortical atrophy. Vascular: Mild intracranial atherosclerosis. Skull: Normal. Negative for fracture or focal lesion. Sinuses/Orbits: The visualized paranasal sinuses are essentially clear. The mastoid air cells are unopacified. Other: None. IMPRESSION: No acute intracranial abnormality. Atrophy with small vessel ischemic changes. Electronically Signed   By: Charline Bills M.D.   On: 03/18/2023 03:53    Microbiology: No results found for this or any previous visit (from the past 240 hour(s)).   Labs: Basic Metabolic Panel: Recent Labs  Lab 03/18/23 0300 03/18/23 1325 03/19/23 0402 03/19/23 0848 03/19/23 1953 03/20/23 0946 03/21/23 0423 03/22/23 0422  NA 126*   < > 125* 126* 123* 130* 130* 131*  K 2.9*   < > 3.4* 3.4* 4.0 3.6 3.1* 3.7  CL 90*   < > 95* 94* 95* 102 103 102  CO2 23   < > 21* 21* 21* 20* 18* 20*  GLUCOSE 139*   < >  123* 139* 196* 125* 114* 139*  BUN 13   < > 12 13 12 11 17 21   CREATININE 0.81   < > 0.93 0.95 0.87 0.86 0.94 0.79  CALCIUM 8.9   < > 8.4* 8.8* 8.4* 8.7* 8.6* 9.0  MG 1.6*  --  2.0  --   --   --  1.9 1.8  PHOS  --   --   --   --   --   --   --  3.9   < > = values in this interval not displayed.   Liver Function Tests: Recent Labs  Lab 03/18/23 0300 03/22/23 0422  AST 29 32  ALT 20 29  ALKPHOS 51 50  BILITOT 1.2 0.5  PROT 7.8 6.8  ALBUMIN 4.5 4.1   No results for input(s): "LIPASE", "AMYLASE" in the last 168 hours. No results for input(s): "AMMONIA" in the last 168 hours. CBC: Recent Labs  Lab 03/18/23 0300 03/19/23 0402 03/20/23 0946 03/21/23 0423 03/22/23 0422  WBC 9.8 8.7 7.1 7.7 10.9*  NEUTROABS 7.6  --   --   --  8.2*  HGB 12.2 11.1* 11.5* 11.2* 11.9*  HCT 34.2* 31.9* 33.0* 33.6* 34.9*  MCV 82.8 84.2  85.3 88.2 86.4  PLT 265 242 229 226 235   Cardiac Enzymes: No results for input(s): "CKTOTAL", "CKMB", "CKMBINDEX", "TROPONINI" in the last 168 hours. BNP: BNP (last 3 results) Recent Labs    03/18/23 1325  BNP 87.9    ProBNP (last 3 results) No results for input(s): "PROBNP" in the last 8760 hours.  CBG: Recent Labs  Lab 03/21/23 1124 03/21/23 1612 03/21/23 2150 03/22/23 0803 03/22/23 1159  GLUCAP 148* 146* 262* 146* 227*       Signed:  Lacretia Nicks MD.  [facility] 03/22/2023, 2:53 PM

## 2023-03-22 NOTE — Progress Notes (Signed)
  Transition of Care [facility]) Screening Note   Patient Details  Name: EURIKA SANDY Date of Birth: 1946/04/15   Transition of Care [facility]) CM/SW Contact:    Larrie Kass, LCSW Phone Number: 03/22/2023, 2:11 PM    Transition of Care Department [facility]) has reviewed patient and no TOC needs have been identified at this time. We will continue to monitor patient advancement through interdisciplinary progression rounds. If new patient transition needs arise, please place a TOC consult.

## 2023-03-22 NOTE — Progress Notes (Signed)
Renal artery duplex has been completed. Preliminary results can be found in CV Proc through chart review.   03/22/23 9:34 AM Olen Cordial RVT

## 2023-03-24 ENCOUNTER — Encounter: Payer: Self-pay | Admitting: Physician Assistant

## 2023-03-25 ENCOUNTER — Encounter (HOSPITAL_COMMUNITY): Payer: Self-pay

## 2023-03-25 ENCOUNTER — Inpatient Hospital Stay (HOSPITAL_COMMUNITY)
Admission: EM | Admit: 2023-03-25 | Discharge: 2023-03-28 | DRG: 641 | Disposition: A | Discharge status: Home / Self Care | Payer: PPO | Attending: Family Medicine | Admitting: Family Medicine

## 2023-03-25 DIAGNOSIS — Z87891 Personal history of nicotine dependence: Secondary | ICD-10-CM

## 2023-03-25 DIAGNOSIS — E2609 Other primary hyperaldosteronism: Secondary | ICD-10-CM | POA: Diagnosis present

## 2023-03-25 DIAGNOSIS — F32A Depression, unspecified: Secondary | ICD-10-CM | POA: Diagnosis not present

## 2023-03-25 DIAGNOSIS — Z7984 Long term (current) use of oral hypoglycemic drugs: Secondary | ICD-10-CM

## 2023-03-25 DIAGNOSIS — Z91014 Allergy to mammalian meats: Secondary | ICD-10-CM

## 2023-03-25 DIAGNOSIS — E876 Hypokalemia: Secondary | ICD-10-CM | POA: Diagnosis present

## 2023-03-25 DIAGNOSIS — Z79899 Other long term (current) drug therapy: Secondary | ICD-10-CM

## 2023-03-25 DIAGNOSIS — Z8249 Family history of ischemic heart disease and other diseases of the circulatory system: Secondary | ICD-10-CM | POA: Diagnosis not present

## 2023-03-25 DIAGNOSIS — E871 Hypo-osmolality and hyponatremia: Secondary | ICD-10-CM | POA: Diagnosis not present

## 2023-03-25 DIAGNOSIS — Z882 Allergy status to sulfonamides status: Secondary | ICD-10-CM | POA: Diagnosis not present

## 2023-03-25 DIAGNOSIS — I160 Hypertensive urgency: Secondary | ICD-10-CM | POA: Diagnosis not present

## 2023-03-25 DIAGNOSIS — E1169 Type 2 diabetes mellitus with other specified complication: Secondary | ICD-10-CM | POA: Diagnosis present

## 2023-03-25 DIAGNOSIS — I10 Essential (primary) hypertension: Secondary | ICD-10-CM | POA: Diagnosis not present

## 2023-03-25 DIAGNOSIS — R112 Nausea with vomiting, unspecified: Secondary | ICD-10-CM | POA: Diagnosis not present

## 2023-03-25 DIAGNOSIS — R631 Polydipsia: Secondary | ICD-10-CM | POA: Diagnosis present

## 2023-03-25 DIAGNOSIS — G9340 Encephalopathy, unspecified: Secondary | ICD-10-CM | POA: Diagnosis not present

## 2023-03-25 DIAGNOSIS — Z888 Allergy status to other drugs, medicaments and biological substances status: Secondary | ICD-10-CM

## 2023-03-25 LAB — ALDOSTERONE + RENIN ACTIVITY W/ RATIO
ALDO / PRA Ratio: >55.7 — ABNORMAL HIGH (ref 0.0–30.0)
Aldosterone: 9.3 ng/dL (ref 0.0–30.0)
PRA LC/MS/MS: <0.167 ng/mL/hr — ABNORMAL LOW (ref 0.167–5.380)

## 2023-03-25 LAB — CBC WITH DIFFERENTIAL/PLATELET
Abs Immature Granulocytes: 0.03 10*3/uL (ref 0.00–0.07)
Basophils Absolute: 0.1 10*3/uL (ref 0.0–0.1)
Basophils Relative: 1 %
Eosinophils Absolute: 0.1 10*3/uL (ref 0.0–0.5)
Eosinophils Relative: 1 %
HCT: 38.2 % (ref 36.0–46.0)
Hemoglobin: 12.3 g/dL (ref 12.0–15.0)
Immature Granulocytes: 0 %
Lymphocytes Relative: 10 %
Lymphs Abs: 0.9 10*3/uL (ref 0.7–4.0)
MCH: 30.1 pg (ref 26.0–34.0)
MCHC: 32.2 g/dL (ref 30.0–36.0)
MCV: 93.4 fL (ref 80.0–100.0)
Monocytes Absolute: 0.6 10*3/uL (ref 0.1–1.0)
Monocytes Relative: 7 %
Neutro Abs: 7.2 10*3/uL (ref 1.7–7.7)
Neutrophils Relative %: 81 %
Platelets: 247 10*3/uL (ref 150–400)
RBC: 4.09 MIL/uL (ref 3.87–5.11)
RDW: 13.6 % (ref 11.5–15.5)
WBC: 8.9 10*3/uL (ref 4.0–10.5)
nRBC: 0 % (ref 0.0–0.2)

## 2023-03-25 MED ORDER — SODIUM CHLORIDE 0.9 % IV BOLUS
1000.0000 mL | Freq: Once | INTRAVENOUS | Status: AC
  Administered 2023-03-25: 1000 mL via INTRAVENOUS

## 2023-03-25 MED ORDER — ONDANSETRON HCL 4 MG/2ML IJ SOLN
4.0000 mg | Freq: Once | INTRAMUSCULAR | Status: AC
  Administered 2023-03-25: 4 mg via INTRAVENOUS
  Filled 2023-03-25: qty 2

## 2023-03-25 NOTE — ED Provider Notes (Signed)
[place] EMERGENCY DEPARTMENT AT [facility] Provider Note   CSN: [id number] Arrival date & time: 03/25/23  2239     History  Chief Complaint  Patient presents with   Emesis   Weakness    Cindy Rhodes is a 77 y.o. female.  77 yo F with a cc of nausea. Patient recently in the hospital for HTN , hyponatremia, hypokalemia.  Just discharged 3 days ago.  They have an appointment with her doctor this morning but they decided that the labs will take too long to come back and they wanted to know what they were immediately and so checked in this evening to be evaluated.  She has had some nausea and vomiting mostly when she tries to take her potassium pills at home.  Says that she gags on the pills.  Otherwise has been doing well.  Feels like her blood pressure is improved.  Still feels a bit fatigued.   Emesis Weakness Associated symptoms: vomiting        Home Medications Prior to Admission medications   Medication Sig Start Date End Date Taking? Authorizing Provider  amLODipine (NORVASC) 5 MG tablet Take 2 tablets (10 mg total) by mouth daily. 03/22/23 05/21/23  Zigmund Daniel., MD  Ascorbic Acid (VITAMIN C) 500 MG CAPS Take 500 mg by mouth daily.    [provider]  Calcium Carbonate-Vitamin D 600-400 MG-UNIT tablet Take 1 tablet by mouth daily.    [provider]  Coenzyme Q10 (CO Q 10 PO) Take 1 capsule by mouth daily.    [provider]  CRESTOR 5 MG tablet Take 2.5 mg by mouth at bedtime.  10/06/13   [provider]  famotidine (PEPCID) 20 MG tablet Take 20 mg by mouth daily.    [provider]  glucosamine-chondroitin 500-400 MG tablet Take 3 tablets by mouth daily.     [provider]  hydrALAZINE (APRESOLINE) 25 MG tablet Take 1.5 tablets (37.5 mg total) by mouth every 8 (eight) hours. 03/22/23 05/21/23  Zigmund Daniel., MD  ibuprofen (ADVIL) 200 MG tablet Take 3 tablets (600 mg total) by mouth 2  (two) times daily as needed. Please use this only as needed 03/22/23   Zigmund Daniel., MD  metFORMIN (GLUCOPHAGE-XR) 500 MG 24 hr tablet Take 1,000 mg by mouth 2 (two) times daily. 07/01/19   [provider]  metoprolol tartrate (LOPRESSOR) 25 MG tablet Take 25 mg by mouth 2 (two) times daily.  09/30/13   [provider]  Misc Natural Products (TART CHERRY ADVANCED) CAPS Take 1 capsule by mouth daily.    [provider]  Multiple Vitamin (MULTIVITAMIN WITH MINERALS) TABS tablet Take 1 tablet by mouth daily.    [provider]  oxybutynin (DITROPAN-XL) 10 MG 24 hr tablet Take 10 mg by mouth daily.  12/28/18   [provider]  potassium chloride 15 MEQ TBCR Take 2 tablets (30 mEq total) by mouth daily. Please repeat labs with your PCP within 1 week to check your potassium and your sodium 03/23/23 04/22/23  Zigmund Daniel., MD  sertraline (ZOLOFT) 50 MG tablet Take 75 mg by mouth daily.  07/22/13   [provider]  telmisartan (MICARDIS) 80 MG tablet Take 1 tablet (80 mg total) by mouth daily. 08/18/19   Mancel Bale, MD  telmisartan-hydrochlorothiazide (MICARDIS HCT) 80-25 MG tablet Take 1 tablet by mouth daily.  07/05/18 08/18/19  [provider]  Allergies    Hydrochlorothiazide, Meloxicam, Pork-derived products, and Sulfa antibiotics    Review of Systems   Review of Systems  Gastrointestinal:  Positive for vomiting.  Neurological:  Positive for weakness.    Physical Exam Updated Vital Signs BP (!) 140/76   Pulse 71   Temp 98.4 F (36.9 C) (Oral)   Resp 18   Ht 5\' 2"  (1.575 m)   Wt 61.8 kg   SpO2 98%   BMI 24.92 kg/m  Physical Exam Vitals and nursing note reviewed.  Constitutional:      General: She is not in acute distress.    Appearance: She is well-developed. She is not diaphoretic.  HENT:     Head: Normocephalic and atraumatic.  Eyes:     Pupils: Pupils are equal, round, and reactive to light.   Cardiovascular:     Rate and Rhythm: Normal rate and regular rhythm.     Heart sounds: No murmur heard.    No friction rub. No gallop.  Pulmonary:     Effort: Pulmonary effort is normal.     Breath sounds: No wheezing or rales.  Abdominal:     General: There is no distension.     Palpations: Abdomen is soft.     Tenderness: There is no abdominal tenderness.  Musculoskeletal:        General: No tenderness.     Cervical back: Normal range of motion and neck supple.  Skin:    General: Skin is warm and dry.  Neurological:     Mental Status: She is alert and oriented to person, place, and time.  Psychiatric:        Behavior: Behavior normal.     ED Results / Procedures / Treatments   Labs (all labs ordered are listed, but only abnormal results are displayed) Labs Reviewed  COMPREHENSIVE METABOLIC PANEL - Abnormal; Notable for the following components:      Result Value   Sodium 122 (*)    Chloride 92 (*)    CO2 18 (*)    Glucose, Bld 159 (*)    All other components within normal limits  MAGNESIUM - Abnormal; Notable for the following components:   Magnesium 1.6 (*)    All other components within normal limits  URINALYSIS, ROUTINE W REFLEX MICROSCOPIC - Abnormal; Notable for the following components:   Color, Urine COLORLESS (*)    Specific Gravity, Urine 1.002 (*)    All other components within normal limits  CBC WITH DIFFERENTIAL/PLATELET  LIPASE, BLOOD  BASIC METABOLIC PANEL  BASIC METABOLIC PANEL  BASIC METABOLIC PANEL    EKG None  Radiology No results found.  Procedures .Critical Care  Performed by: Melene Plan, DO Authorized by: Melene Plan, DO   Critical care provider statement:    Critical care time (minutes):  35   Critical care time was exclusive of:  Separately billable procedures and treating other patients   Critical care was time spent personally by me on the following activities:  Development of treatment plan with patient or surrogate,  discussions with consultants, evaluation of patient's response to treatment, examination of patient, ordering and review of laboratory studies, ordering and review of radiographic studies, ordering and performing treatments and interventions, pulse oximetry, re-evaluation of patient's condition and review of old charts   Care discussed with: admitting provider       Medications Ordered in ED Medications  amLODipine (NORVASC) tablet 10 mg (has no administration in time range)  rosuvastatin (CRESTOR) tablet 2.5 mg (  has no administration in time range)  famotidine (PEPCID) tablet 20 mg (has no administration in time range)  hydrALAZINE (APRESOLINE) tablet 37.5 mg (has no administration in time range)  ibuprofen (ADVIL) tablet 600 mg (has no administration in time range)  metoprolol tartrate (LOPRESSOR) tablet 25 mg (has no administration in time range)  oxybutynin (DITROPAN-XL) 24 hr tablet 10 mg (has no administration in time range)  irbesartan (AVAPRO) tablet 300 mg (has no administration in time range)  sertraline (ZOLOFT) tablet 75 mg (has no administration in time range)  multivitamin with minerals tablet 1 tablet (has no administration in time range)  Potassium Chloride ER TBCR 30 mEq (has no administration in time range)  magnesium sulfate IVPB 2 g 50 mL (2 g Intravenous New Bag/Given 03/26/23 0144)  insulin aspart (novoLOG) injection 0-9 Units (has no administration in time range)  enoxaparin (LOVENOX) injection 40 mg (has no administration in time range)  acetaminophen (TYLENOL) tablet 650 mg (has no administration in time range)    Or  acetaminophen (TYLENOL) suppository 650 mg (has no administration in time range)  ondansetron (ZOFRAN) tablet 4 mg (has no administration in time range)    Or  ondansetron (ZOFRAN) injection 4 mg (has no administration in time range)  sodium chloride 0.9 % bolus 1,000 mL (0 mLs Intravenous Stopped 03/26/23 0112)  ondansetron (ZOFRAN) injection 4 mg (4  mg Intravenous Given 03/25/23 2351)    ED Course/ Medical Decision Making/ A&P                             Medical Decision Making Amount and/or Complexity of Data Reviewed Labs: ordered.  Risk Prescription drug management. Decision regarding hospitalization.   77 yo F with a chief complaints of wanting her sodium and potassium checked.  The patient tells me that she is supposed to have this rechecked tomorrow but they are worried that it would take a day or 2 to get the lab results back and so decided to come here to get them checked this evening.  She has been gagging but it seems like that is related to when she takes her potassium supplementation at home.  She does endorse some nausea.  Will give her a bolus of IV fluids here antiemetics lab work.    She had had an episode years ago where she thought that symptoms similar were due to a urinary tract infection and she still wonders if maybe she has 1 even though her urine at the time of admission was negative.  Patient sodium is 122, this is less than it was when she was admitted last week.  She does tell me that she is continue to not eat well but has been trying to push fluids.  I discussed with the hospitalist for admission.  The patients results and plan were reviewed and discussed.   Any x-rays performed were independently reviewed by myself.   Differential diagnosis were considered with the presenting HPI.  Medications  amLODipine (NORVASC) tablet 10 mg (has no administration in time range)  rosuvastatin (CRESTOR) tablet 2.5 mg (has no administration in time range)  famotidine (PEPCID) tablet 20 mg (has no administration in time range)  hydrALAZINE (APRESOLINE) tablet 37.5 mg (has no administration in time range)  ibuprofen (ADVIL) tablet 600 mg (has no administration in time range)  metoprolol tartrate (LOPRESSOR) tablet 25 mg (has no administration in time range)  oxybutynin (DITROPAN-XL) 24 hr tablet 10 mg (has  no  administration in time range)  irbesartan (AVAPRO) tablet 300 mg (has no administration in time range)  sertraline (ZOLOFT) tablet 75 mg (has no administration in time range)  multivitamin with minerals tablet 1 tablet (has no administration in time range)  Potassium Chloride ER TBCR 30 mEq (has no administration in time range)  magnesium sulfate IVPB 2 g 50 mL (2 g Intravenous New Bag/Given 03/26/23 0144)  insulin aspart (novoLOG) injection 0-9 Units (has no administration in time range)  enoxaparin (LOVENOX) injection 40 mg (has no administration in time range)  acetaminophen (TYLENOL) tablet 650 mg (has no administration in time range)    Or  acetaminophen (TYLENOL) suppository 650 mg (has no administration in time range)  ondansetron (ZOFRAN) tablet 4 mg (has no administration in time range)    Or  ondansetron (ZOFRAN) injection 4 mg (has no administration in time range)  sodium chloride 0.9 % bolus 1,000 mL (0 mLs Intravenous Stopped 03/26/23 0112)  ondansetron (ZOFRAN) injection 4 mg (4 mg Intravenous Given 03/25/23 2351)    Vitals:   03/25/23 2248 03/25/23 2255 03/26/23 0100 03/26/23 0130  BP: (!) 126/111  (!) 153/73 (!) 140/76  Pulse: 69  71 71  Resp: 16   18  Temp: 98.4 F (36.9 C)     TempSrc: Oral     SpO2: 96%  99% 98%  Weight:  61.8 kg    Height:  5\' 2"  (1.575 m)      Final diagnoses:  Hyponatremia    Admission/ observation were discussed with the admitting physician, patient and/or family and they are comfortable with the plan.          Final Clinical Impression(s) / ED Diagnoses Final diagnoses:  Hyponatremia    Rx / DC Orders ED Discharge Orders     None         Melene Plan, DO 03/26/23 0224

## 2023-03-25 NOTE — ED Triage Notes (Signed)
Pt arrived POV for wanting her labs recheck for sodium and potassium as they were low at recent admission. Pt d/c 4/22 from [facility] for HTN crisis and acute encephalopathy. Pt reports since being discharge she has felt weak and has been "gagging". NAD noted, A&O x4.

## 2023-03-26 DIAGNOSIS — E871 Hypo-osmolality and hyponatremia: Principal | ICD-10-CM

## 2023-03-26 DIAGNOSIS — G9340 Encephalopathy, unspecified: Secondary | ICD-10-CM | POA: Diagnosis not present

## 2023-03-26 DIAGNOSIS — I10 Essential (primary) hypertension: Secondary | ICD-10-CM | POA: Diagnosis not present

## 2023-03-26 DIAGNOSIS — E1169 Type 2 diabetes mellitus with other specified complication: Secondary | ICD-10-CM

## 2023-03-26 DIAGNOSIS — E876 Hypokalemia: Secondary | ICD-10-CM | POA: Diagnosis not present

## 2023-03-26 LAB — BASIC METABOLIC PANEL
Anion gap: 11 (ref 5–15)
Anion gap: 10 (ref 5–15)
Anion gap: 8 (ref 5–15)
BUN: 17 mg/dL (ref 8–23)
BUN: 19 mg/dL (ref 8–23)
BUN: 23 mg/dL (ref 8–23)
CO2: 16 mmol/L — ABNORMAL LOW (ref 22–32)
CO2: 17 mmol/L — ABNORMAL LOW (ref 22–32)
CO2: 20 mmol/L — ABNORMAL LOW (ref 22–32)
Calcium: 8.6 mg/dL — ABNORMAL LOW (ref 8.9–10.3)
Calcium: 8.5 mg/dL — ABNORMAL LOW (ref 8.9–10.3)
Calcium: 8.7 mg/dL — ABNORMAL LOW (ref 8.9–10.3)
Chloride: 100 mmol/L (ref 98–111)
Chloride: 98 mmol/L (ref 98–111)
Chloride: 98 mmol/L (ref 98–111)
Creatinine, Ser: 0.81 mg/dL (ref 0.44–1.00)
Creatinine, Ser: 0.93 mg/dL (ref 0.44–1.00)
Creatinine, Ser: 0.91 mg/dL (ref 0.44–1.00)
GFR, Estimated: >60 mL/min (ref >60)
GFR, Estimated: >60 mL/min (ref >60)
GFR, Estimated: >60 mL/min (ref >60)
Glucose, Bld: 132 mg/dL — ABNORMAL HIGH (ref 70–99)
Glucose, Bld: 159 mg/dL — ABNORMAL HIGH (ref 70–99)
Glucose, Bld: 75 mg/dL (ref 70–99)
Potassium: 3 mmol/L — ABNORMAL LOW (ref 3.5–5.1)
Potassium: 3.1 mmol/L — ABNORMAL LOW (ref 3.5–5.1)
Potassium: 4.4 mmol/L (ref 3.5–5.1)
Sodium: 127 mmol/L — ABNORMAL LOW (ref 135–145)
Sodium: 125 mmol/L — ABNORMAL LOW (ref 135–145)
Sodium: 126 mmol/L — ABNORMAL LOW (ref 135–145)

## 2023-03-26 LAB — COMPREHENSIVE METABOLIC PANEL
ALT: 27 U/L (ref 0–44)
AST: 23 U/L (ref 15–41)
Albumin: 4.2 g/dL (ref 3.5–5.0)
Alkaline Phosphatase: 50 U/L (ref 38–126)
Anion gap: 12 (ref 5–15)
BUN: 19 mg/dL (ref 8–23)
CO2: 18 mmol/L — ABNORMAL LOW (ref 22–32)
Calcium: 9.1 mg/dL (ref 8.9–10.3)
Chloride: 92 mmol/L — ABNORMAL LOW (ref 98–111)
Creatinine, Ser: 0.87 mg/dL (ref 0.44–1.00)
GFR, Estimated: >60 mL/min (ref >60)
Glucose, Bld: 159 mg/dL — ABNORMAL HIGH (ref 70–99)
Potassium: 3.6 mmol/L (ref 3.5–5.1)
Sodium: 122 mmol/L — ABNORMAL LOW (ref 135–145)
Total Bilirubin: 1 mg/dL (ref 0.3–1.2)
Total Protein: 7.1 g/dL (ref 6.5–8.1)

## 2023-03-26 LAB — GLUCOSE, CAPILLARY
Glucose-Capillary: 125 mg/dL — ABNORMAL HIGH (ref 70–99)
Glucose-Capillary: 194 mg/dL — ABNORMAL HIGH (ref 70–99)
Glucose-Capillary: 158 mg/dL — ABNORMAL HIGH (ref 70–99)
Glucose-Capillary: 120 mg/dL — ABNORMAL HIGH (ref 70–99)

## 2023-03-26 LAB — URINALYSIS, ROUTINE W REFLEX MICROSCOPIC
Bilirubin Urine: NEGATIVE
Glucose, UA: NEGATIVE mg/dL
Hgb urine dipstick: NEGATIVE
Ketones, ur: NEGATIVE mg/dL
Leukocytes,Ua: NEGATIVE
Nitrite: NEGATIVE
Protein, ur: NEGATIVE mg/dL
Specific Gravity, Urine: 1.002 — ABNORMAL LOW (ref 1.005–1.030)
pH: 6 (ref 5.0–8.0)

## 2023-03-26 LAB — OSMOLALITY, URINE: Osmolality, Ur: 158 mOsm/kg — ABNORMAL LOW (ref 300–900)

## 2023-03-26 LAB — MAGNESIUM: Magnesium: 1.6 mg/dL — ABNORMAL LOW (ref 1.7–2.4)

## 2023-03-26 LAB — SODIUM, URINE, RANDOM: Sodium, Ur: 41 mmol/L

## 2023-03-26 LAB — LIPASE, BLOOD: Lipase: 39 U/L (ref 11–51)

## 2023-03-26 MED ORDER — POTASSIUM CHLORIDE CRYS ER 20 MEQ PO TBCR
40.0000 meq | EXTENDED_RELEASE_TABLET | Freq: Two times a day (BID) | ORAL | Status: AC
  Administered 2023-03-26 (×2): 40 meq via ORAL
  Filled 2023-03-26 (×2): qty 2

## 2023-03-26 MED ORDER — MAGNESIUM SULFATE 2 GM/50ML IV SOLN
2.0000 g | Freq: Once | INTRAVENOUS | Status: AC
  Administered 2023-03-26: 2 g via INTRAVENOUS
  Filled 2023-03-26: qty 50

## 2023-03-26 MED ORDER — AMLODIPINE BESYLATE 10 MG PO TABS
10.0000 mg | ORAL_TABLET | Freq: Every day | ORAL | Status: DC
  Administered 2023-03-26: 10 mg via ORAL
  Filled 2023-03-26: qty 1

## 2023-03-26 MED ORDER — ACETAMINOPHEN 650 MG RE SUPP: 650.0000 mg | Freq: Four times a day (QID) | RECTAL | Status: DC | PRN

## 2023-03-26 MED ORDER — SERTRALINE HCL 50 MG PO TABS
75.0000 mg | ORAL_TABLET | Freq: Every day | ORAL | Status: DC
  Administered 2023-03-26 – 2023-03-28 (×3): 75 mg via ORAL
  Filled 2023-03-26 (×3): qty 1

## 2023-03-26 MED ORDER — INSULIN ASPART 100 UNIT/ML IJ SOLN
0.0000 [IU] | Freq: Three times a day (TID) | INTRAMUSCULAR | Status: DC
  Administered 2023-03-26 (×2): 2 [IU] via SUBCUTANEOUS
  Administered 2023-03-27: 3 [IU] via SUBCUTANEOUS
  Filled 2023-03-26: qty 0.09

## 2023-03-26 MED ORDER — ONDANSETRON HCL 4 MG/2ML IJ SOLN: 4.0000 mg | Freq: Four times a day (QID) | INTRAMUSCULAR | Status: DC | PRN

## 2023-03-26 MED ORDER — ROSUVASTATIN CALCIUM 5 MG PO TABS
2.5000 mg | ORAL_TABLET | Freq: Every day | ORAL | Status: DC
  Administered 2023-03-26 – 2023-03-27 (×2): 2.5 mg via ORAL
  Filled 2023-03-26 (×2): qty 1

## 2023-03-26 MED ORDER — ENOXAPARIN SODIUM 40 MG/0.4ML IJ SOSY
40.0000 mg | PREFILLED_SYRINGE | INTRAMUSCULAR | Status: DC
  Administered 2023-03-26 – 2023-03-27 (×2): 40 mg via SUBCUTANEOUS
  Filled 2023-03-26 (×2): qty 0.4

## 2023-03-26 MED ORDER — IBUPROFEN 200 MG PO TABS: 600.0000 mg | ORAL_TABLET | Freq: Two times a day (BID) | ORAL | Status: DC | PRN

## 2023-03-26 MED ORDER — POTASSIUM CHLORIDE CRYS ER 10 MEQ PO TBCR: 30.0000 meq | EXTENDED_RELEASE_TABLET | Freq: Every day | ORAL | Status: DC

## 2023-03-26 MED ORDER — AMLODIPINE BESYLATE 5 MG PO TABS
5.0000 mg | ORAL_TABLET | Freq: Every day | ORAL | Status: DC
  Administered 2023-03-27 – 2023-03-28 (×2): 5 mg via ORAL
  Filled 2023-03-26 (×2): qty 1

## 2023-03-26 MED ORDER — ACETAMINOPHEN 325 MG PO TABS: 650.0000 mg | ORAL_TABLET | Freq: Four times a day (QID) | ORAL | Status: DC | PRN

## 2023-03-26 MED ORDER — FAMOTIDINE 20 MG PO TABS
20.0000 mg | ORAL_TABLET | Freq: Every day | ORAL | Status: DC
  Administered 2023-03-26 – 2023-03-28 (×3): 20 mg via ORAL
  Filled 2023-03-26 (×3): qty 1

## 2023-03-26 MED ORDER — METOPROLOL TARTRATE 25 MG PO TABS
25.0000 mg | ORAL_TABLET | Freq: Two times a day (BID) | ORAL | Status: DC
  Administered 2023-03-26 – 2023-03-28 (×5): 25 mg via ORAL
  Filled 2023-03-26 (×5): qty 1

## 2023-03-26 MED ORDER — HYDRALAZINE HCL 25 MG PO TABS: 37.5000 mg | ORAL_TABLET | Freq: Three times a day (TID) | ORAL | Status: DC

## 2023-03-26 MED ORDER — SPIRONOLACTONE 12.5 MG HALF TABLET
12.5000 mg | ORAL_TABLET | Freq: Every day | ORAL | Status: DC
  Administered 2023-03-27 – 2023-03-28 (×2): 12.5 mg via ORAL
  Filled 2023-03-26 (×3): qty 1

## 2023-03-26 MED ORDER — ADULT MULTIVITAMIN W/MINERALS CH
1.0000 | ORAL_TABLET | Freq: Every day | ORAL | Status: DC
  Administered 2023-03-26 – 2023-03-28 (×3): 1 via ORAL
  Filled 2023-03-26 (×3): qty 1

## 2023-03-26 MED ORDER — TART CHERRY ADVANCED PO CAPS: 1.0000 | ORAL_CAPSULE | Freq: Every day | ORAL | Status: DC

## 2023-03-26 MED ORDER — IRBESARTAN 300 MG PO TABS
300.0000 mg | ORAL_TABLET | Freq: Every day | ORAL | Status: DC
  Administered 2023-03-26 – 2023-03-28 (×3): 300 mg via ORAL
  Filled 2023-03-26 (×4): qty 1

## 2023-03-26 MED ORDER — OXYBUTYNIN CHLORIDE ER 5 MG PO TB24
10.0000 mg | ORAL_TABLET | Freq: Every day | ORAL | Status: DC
  Administered 2023-03-26 – 2023-03-28 (×3): 10 mg via ORAL
  Filled 2023-03-26: qty 2
  Filled 2023-03-26: qty 1
  Filled 2023-03-26 (×2): qty 2

## 2023-03-26 MED ORDER — ONDANSETRON HCL 4 MG PO TABS: 4.0000 mg | ORAL_TABLET | Freq: Four times a day (QID) | ORAL | Status: DC | PRN

## 2023-03-26 NOTE — Consult Note (Signed)
Reason for Consult: hyponatremia Referring Physician: [name] is an 77 y.o. female with past medical history significant for diabetes mellitus-oral meds only, hypertension which seems to be worse of late requiring upward titration of medications.  She also has a history of hyponatremia which appears to date back to 2015-sodium at best 130-131.  She had a hospitalization from 4/18 to 4/22 where she had significantly elevated blood pressure with encephalopathy-it improved apparently with medication.  She was also found to be hyponatremic during that hospitalization.  Urine osmolality was low did not appear to be consistent with SIADH.  There was a history of excessive water intake.  With fluid restriction over the course of hospitalization sodium improved to 131 on 4/22.  Other workup for hyponatremia at that time include normal TSH and normal ACTH stim test.  She was discharged with instructions to continue to fluid restrict.  She indicates to me that she was not doing such a great job with that.  She returned to the hospital on 4/25 with some weakness and nausea.  Sodium was found to be 122.  She was given a liter of fluid, potassium improved to 127, then 125 several hours later.  Again, urine Osmo is low at 158 and urine sodium was 41.  I am asked to assist with management of this hyponatremia  The other issue is his hypertension which according to the family came out of nowhere.  She has persistent hypokalemia-had aldosterone and renin checked last hospitalization-aldosterone was not significantly high but her aldo to renin ratio was elevated   Trend in Creatinine: Creatinine, Ser  Date/Time Value Ref Range Status  03/26/2023 11:46 AM 0.93 0.44 - 1.00 mg/dL Final  16/08/9603 54:09 AM 0.81 0.44 - 1.00 mg/dL Final  81/19/1478 29:56 PM 0.87 0.44 - 1.00 mg/dL Final  21/30/8657 84:69 AM 0.79 0.44 - 1.00 mg/dL Final  62/95/2841 32:44 AM 0.94 0.44 - 1.00 mg/dL Final  12/02/7251 66:44 AM 0.86  0.44 - 1.00 mg/dL Final  03/47/4259 56:38 PM 0.87 0.44 - 1.00 mg/dL Final  75/64/3329 51:88 AM 0.95 0.44 - 1.00 mg/dL Final  41/66/0630 16:01 AM 0.93 0.44 - 1.00 mg/dL Final  09/32/3557 32:20 PM 0.80 0.44 - 1.00 mg/dL Final  25/42/7062 37:62 AM 0.81 0.44 - 1.00 mg/dL Final  83/15/1761 60:73 PM 0.52 0.44 - 1.00 mg/dL Final  71/04/2693 85:46 AM 0.59 0.44 - 1.00 mg/dL Final  27/01/5008 38:18 AM 0.67 0.44 - 1.00 mg/dL Final  29/93/7169 67:89 PM 0.55 0.44 - 1.00 mg/dL Final  38/08/1750 02:58 AM 0.60 0.44 - 1.00 mg/dL Final  52/77/8242 35:36 PM 0.67 0.44 - 1.00 mg/dL Final  14/43/1540 08:67 PM 0.40 (L) 0.50 - 1.10 mg/dL Final   Sodium  Date/Time Value Ref Range Status  03/26/2023 11:46 AM 125 (L) 135 - 145 mmol/L Final  03/26/2023 06:04 AM 127 (L) 135 - 145 mmol/L Final  03/25/2023 11:47 PM 122 (L) 135 - 145 mmol/L Final  03/22/2023 04:22 AM 131 (L) 135 - 145 mmol/L Final  03/21/2023 04:23 AM 130 (L) 135 - 145 mmol/L Final  03/20/2023 09:46 AM 130 (L) 135 - 145 mmol/L Final    Comment:    DELTA CHECK NOTED  03/19/2023 07:53 PM 123 (L) 135 - 145 mmol/L Final  03/19/2023 08:48 AM 126 (L) 135 - 145 mmol/L Final  03/19/2023 04:02 AM 125 (L) 135 - 145 mmol/L Final  03/18/2023 01:25 PM 124 (L) 135 - 145 mmol/L Final  03/18/2023 03:00 AM 126 (L)  135 - 145 mmol/L Final  08/18/2019 02:26 PM 125 (L) 135 - 145 mmol/L Final  08/15/2017 05:16 AM 134 (L) 135 - 145 mmol/L Final  08/14/2017 05:52 AM 135 135 - 145 mmol/L Final  08/13/2017 05:08 PM 131 (L) 135 - 145 mmol/L Final  08/13/2017 05:21 AM 128 (L) 135 - 145 mmol/L Final  08/12/2017 09:29 PM 125 (L) 135 - 145 mmol/L Final  04/02/2014 12:22 PM 129 (L) 137 - 147 mEq/L Final   PMH:   Past Medical History:  Diagnosis Date   Depression    Diabetes mellitus without complication (HCC)    Hypertension     PSH:   Past Surgical History:  Procedure Laterality Date   ABDOMINAL HYSTERECTOMY     HAND SURGERY Left     Allergies:  Allergies   Allergen Reactions   Hydrochlorothiazide Other (See Comments)    Unknown    Meloxicam Other (See Comments)    Dizzy and nervous    Pork-Derived Products Other (See Comments)    Religious    Sulfa Antibiotics Other (See Comments)    Shuts down urinary system     Medications:   Prior to Admission medications   Medication Sig Start Date End Date Taking? Authorizing Provider  amLODipine (NORVASC) 5 MG tablet Take 2 tablets (10 mg total) by mouth daily. 03/22/23 05/21/23 Yes Zigmund Daniel., MD  Ascorbic Acid (VITAMIN C) 500 MG CAPS Take 500 mg by mouth daily.   Yes [provider]  Calcium Carbonate-Vitamin D 600-400 MG-UNIT tablet Take 1 tablet by mouth daily.   Yes [provider]  Coenzyme Q10 (CO Q 10 PO) Take 1 capsule by mouth daily.   Yes [provider]  CRESTOR 5 MG tablet Take 2.5 mg by mouth at bedtime.  10/06/13  Yes [provider]  famotidine (PEPCID) 20 MG tablet Take 20 mg by mouth daily.   Yes [provider]  glucosamine-chondroitin 500-400 MG tablet Take 3 tablets by mouth daily.    Yes [provider]  hydrALAZINE (APRESOLINE) 25 MG tablet Take 1.5 tablets (37.5 mg total) by mouth every 8 (eight) hours. 03/22/23 05/21/23 Yes Zigmund Daniel., MD  ibuprofen (ADVIL) 200 MG tablet Take 3 tablets (600 mg total) by mouth 2 (two) times daily as needed. Please use this only as needed Patient taking differently: Take 600 mg by mouth 2 (two) times daily as needed for moderate pain. Please use this only as needed 03/22/23  Yes Zigmund Daniel., MD  metFORMIN (GLUCOPHAGE-XR) 500 MG 24 hr tablet Take 1,000 mg by mouth 2 (two) times daily. 07/01/19  Yes [provider]  metoprolol tartrate (LOPRESSOR) 25 MG tablet Take 25 mg by mouth 2 (two) times daily.  09/30/13  Yes [provider]  Misc Natural Products (TART CHERRY ADVANCED) CAPS Take 1 capsule by mouth daily.   Yes [provider]   Multiple Vitamin (MULTIVITAMIN WITH MINERALS) TABS tablet Take 1 tablet by mouth daily.   Yes [provider]  oxybutynin (DITROPAN-XL) 10 MG 24 hr tablet Take 10 mg by mouth daily.  12/28/18  Yes [provider]  potassium chloride 15 MEQ TBCR Take 2 tablets (30 mEq total) by mouth daily. Please repeat labs with your PCP within 1 week to check your potassium and your sodium 03/23/23 04/22/23 Yes Zigmund Daniel., MD  sertraline (ZOLOFT) 50 MG tablet Take 75 mg by mouth daily.  07/22/13  Yes [provider]  telmisartan (MICARDIS) 80 MG tablet Take 1 tablet (80 mg total) by mouth daily. 08/18/19  Yes Mancel Bale, MD  telmisartan-hydrochlorothiazide (MICARDIS HCT) 80-25 MG tablet Take 1 tablet by mouth daily.  07/05/18 08/18/19  [provider]    Discontinued Meds:   Medications Discontinued During This Encounter  Medication Reason   Tart Cherry Advanced CAPS 1 capsule P&T Policy: Herbal Product    potassium chloride (KLOR-CON M) CR tablet 30 mEq     Social History:  reports that she has quit smoking. She has never used smokeless tobacco. She reports that she does not drink alcohol and does not use drugs.  Family History:   Family History  Problem Relation Age of Onset   Hypertension Other     A comprehensive review of systems was negative.  Blood pressure 123/60, pulse 62, temperature 97.8 F (36.6 C), temperature source Oral, resp. rate 18, height 5\' 2"  (1.575 m), weight 61.8 kg, SpO2 98 %. General appearance: alert, appears stated age, and no distress Resp: clear to auscultation bilaterally Cardio: regular rate and rhythm GI: soft, non-tender; bowel sounds normal; no masses,  no organomegaly Extremities: extremities normal, atraumatic, no cyanosis or edema Neurologic: Grossly normal Slightly anxious   Labs: Basic Metabolic Panel: Recent Labs  Lab 03/19/23 1953 03/20/23 0946 03/21/23 0423 03/22/23 0422 03/25/23 2347 03/26/23 0604  03/26/23 1146  NA 123* 130* 130* 131* 122* 127* 125*  K 4.0 3.6 3.1* 3.7 3.6 3.0* 3.1*  CL 95* 102 103 102 92* 100 98  CO2 21* 20* 18* 20* 18* 16* 17*  GLUCOSE 196* 125* 114* 139* 159* 132* 159*  BUN 12 11 17 21 19 17 19   CREATININE 0.87 0.86 0.94 0.79 0.87 0.81 0.93  ALBUMIN  --   --   --  4.1 4.2  --   --   CALCIUM 8.4* 8.7* 8.6* 9.0 9.1 8.6* 8.5*  PHOS  --   --   --  3.9  --   --   --    Liver Function Tests: Recent Labs  Lab 03/22/23 0422 03/25/23 2347  AST 32 23  ALT 29 27  ALKPHOS 50 50  BILITOT 0.5 1.0  PROT 6.8 7.1  ALBUMIN 4.1 4.2   Recent Labs  Lab 03/25/23 2347  LIPASE 39   No results for input(s): "AMMONIA" in the last 168 hours. CBC: Recent Labs  Lab 03/20/23 0946 03/21/23 0423 03/22/23 0422 03/25/23 2347  WBC 7.1 7.7 10.9* 8.9  NEUTROABS  --   --  8.2* 7.2  HGB 11.5* 11.2* 11.9* 12.3  HCT 33.0* 33.6* 34.9* 38.2  MCV 85.3 88.2 86.4 93.4  PLT 229 226 235 247   PT/INR: @labrcntip (inr:5) Cardiac Enzymes: No results for input(s): "CKTOTAL", "CKMB", "CKMBINDEX", "TROPONINI" in the last 168 hours. CBG: Recent Labs  Lab 03/21/23 2150 03/22/23 0803 03/22/23 1159 03/26/23 0741 03/26/23 1112  GLUCAP 262* 146* 227* 125* 194*    Iron Studies: No results for input(s): "IRON", "TIBC", "TRANSFERRIN", "FERRITIN" in the last 168 hours.  Xrays/Other Studies: No results found.   Assessment/Plan: 77 year old female with very chronic hyponatremia and newer onset hypertension with hypokalemia  1.  Hyponatremia-is very longstanding which would be more consistent with a reset osmostat or SIADH.  Interestingly, her urine osmolality is not consistent with SIADH at least at this time, or the last 3 times it has been checked.  She does give a history of excessive water intake and possibly slightly less solute intake.  During her last  hospitalization her sodium improved with only fluid restriction.  Therefore, this seems to be the mechanism of action.  I looked at  her medication list and she is on NSAIDs which can occasionally be an etiology for hyponatremia.  It does not seem like she is received lately so I will just discontinue it.  Will also institute fluid restriction of 1200 cc and follow her sodium level 2.  Hypertension with hypokalemia-this seems to be consistent with hyperaldosteronism which would not give hyponatremia.  In these patients Aldactone can be quite instrumental in bringing blood pressure down.  Therefore, I have started a small dose of that and decreased Norvasc and stop hydralazine as I anticipate blood pressure will be better on the Aldactone and we will use that to titrate up which will also help keep her potassium up.  I agree with the ARB for the same reason.  I would not necessarily chase down an aldosterone secreting tumor because of her age we would not treat this with anything like surgery-and if aldosterone can be blocked it will not have another impact on her health   Cecille Aver 03/26/2023, 2:53 PM

## 2023-03-26 NOTE — Assessment & Plan Note (Addendum)
Cont home BP meds Normal PRC with very low PRA.  Might want to consult / curbside endocrine in AM. (Primary hyperaldosteronism would explain her severe HTN + hypokalemia last admit, shouldn't explain the hyponatremia though?).

## 2023-03-26 NOTE — Care Management Obs Status (Signed)
MEDICARE OBSERVATION STATUS NOTIFICATION   Patient Details  Name: Cindy Rhodes MRN: [id number] Date of Birth: 08-11-1946   Medicare Observation Status Notification Given:       Howell Rucks, RN 03/26/2023, 2:29 PM

## 2023-03-26 NOTE — Progress Notes (Signed)
Mobility Specialist - Progress Note   03/26/23 1300  Mobility  Activity Ambulated with assistance in hallway  Level of Assistance Contact guard assist, steadying assist  Assistive Device Other (Comment) (hand held assist w husband)  Distance Ambulated (ft) 500 ft  Activity Response Tolerated well  Mobility Referral Yes  $Mobility charge 1 Mobility   Pt received in bed and agreed to mobility, had no issues throughout session, returned to bed with all needs met and family in room.  Marilynne Halsted Mobility Specialist

## 2023-03-26 NOTE — H&P (Signed)
History and Physical    Patient: Cindy Rhodes:[id number] DOB: 1946/01/24 DOA: 03/25/2023 DOS: the patient was seen and examined on 03/26/2023 PCP: Renford Dills, MD  Patient coming from: Home  Chief Complaint:  Chief Complaint  Patient presents with   Emesis   Weakness   HPI: Cindy Rhodes is a 77 y.o. female with medical history significant of DM2, HTN.  Pt admitted to hospital this past week for AMS due to HTN urgency + hyponatremia.  HTN improved with BP meds.  Hyponatremia was felt to be due to too much water intake and ended up improving with dietary free water restriction.  Pt discharged on 4/22.  Pt back into ED, has an appointment with her doctor this morning but they decided that the labs will take too long to come back and they wanted to know what they were immediately and so checked in this evening to be evaluated. She has had some nausea and vomiting mostly when she tries to take her potassium pills at home. Says that she gags on the pills. Otherwise has been doing well. Feels like her blood pressure is improved. Still feels a bit fatigued.   Sodium 122 today.   Review of Systems: As mentioned in the history of present illness. All other systems reviewed and are negative. Past Medical History:  Diagnosis Date   Depression    Diabetes mellitus without complication (HCC)    Hypertension    Past Surgical History:  Procedure Laterality Date   ABDOMINAL HYSTERECTOMY     HAND SURGERY Left    Social History:  reports that she has quit smoking. She has never used smokeless tobacco. She reports that she does not drink alcohol and does not use drugs.  Allergies  Allergen Reactions   Hydrochlorothiazide Other (See Comments)    Unknown    Meloxicam Other (See Comments)    Dizzy and nervous    Pork-Derived Products Other (See Comments)    Religious    Sulfa Antibiotics Other (See Comments)    Shuts down urinary system     Family History  Problem Relation Age of  Onset   Hypertension Other     Prior to Admission medications   Medication Sig Start Date End Date Taking? Authorizing Provider  amLODipine (NORVASC) 5 MG tablet Take 2 tablets (10 mg total) by mouth daily. 03/22/23 05/21/23  Zigmund Daniel., MD  Ascorbic Acid (VITAMIN C) 500 MG CAPS Take 500 mg by mouth daily.    [provider]  Calcium Carbonate-Vitamin D 600-400 MG-UNIT tablet Take 1 tablet by mouth daily.    [provider]  Coenzyme Q10 (CO Q 10 PO) Take 1 capsule by mouth daily.    [provider]  CRESTOR 5 MG tablet Take 2.5 mg by mouth at bedtime.  10/06/13   [provider]  famotidine (PEPCID) 20 MG tablet Take 20 mg by mouth daily.    [provider]  glucosamine-chondroitin 500-400 MG tablet Take 3 tablets by mouth daily.     [provider]  hydrALAZINE (APRESOLINE) 25 MG tablet Take 1.5 tablets (37.5 mg total) by mouth every 8 (eight) hours. 03/22/23 05/21/23  Zigmund Daniel., MD  ibuprofen (ADVIL) 200 MG tablet Take 3 tablets (600 mg total) by mouth 2 (two) times daily as needed. Please use this only as needed 03/22/23   Zigmund Daniel., MD  metFORMIN (GLUCOPHAGE-XR) 500 MG 24 hr tablet Take 1,000 mg by mouth 2 (  two) times daily. 07/01/19   [provider]  metoprolol tartrate (LOPRESSOR) 25 MG tablet Take 25 mg by mouth 2 (two) times daily.  09/30/13   [provider]  Misc Natural Products (TART CHERRY ADVANCED) CAPS Take 1 capsule by mouth daily.    [provider]  Multiple Vitamin (MULTIVITAMIN WITH MINERALS) TABS tablet Take 1 tablet by mouth daily.    [provider]  oxybutynin (DITROPAN-XL) 10 MG 24 hr tablet Take 10 mg by mouth daily.  12/28/18   [provider]  potassium chloride 15 MEQ TBCR Take 2 tablets (30 mEq total) by mouth daily. Please repeat labs with your PCP within 1 week to check your potassium and your sodium 03/23/23 04/22/23  Zigmund Daniel., MD  sertraline (ZOLOFT) 50 MG tablet Take 75 mg by mouth daily.  07/22/13   [provider]  telmisartan (MICARDIS) 80 MG tablet Take 1 tablet (80 mg total) by mouth daily. 08/18/19   Mancel Bale, MD  telmisartan-hydrochlorothiazide (MICARDIS HCT) 80-25 MG tablet Take 1 tablet by mouth daily.  07/05/18 08/18/19  [provider]    Physical Exam: Vitals:   03/25/23 2248 03/25/23 2255 03/26/23 0100 03/26/23 0130  BP: (!) 126/111  (!) 153/73 (!) 140/76  Pulse: 69  71 71  Resp: 16   18  Temp: 98.4 F (36.9 C)     TempSrc: Oral     SpO2: 96%  99% 98%  Weight:  61.8 kg    Height:  5\' 2"  (1.575 m)     Constitutional: NAD, calm, comfortable Respiratory: clear to auscultation bilaterally, no wheezing, no crackles. Normal respiratory effort. No accessory muscle use.  Cardiovascular: Regular rate and rhythm, no murmurs / rubs / gallops. No extremity edema. 2+ pedal pulses. No carotid bruits.  Abdomen: no tenderness, no masses palpated. No hepatosplenomegaly. Bowel sounds positive.  Neurologic: CN 2-12 grossly intact. Sensation intact, DTR normal. Strength 5/5 in all 4.  Psychiatric: Normal judgment and insight. Alert and oriented x 3. Normal mood.   Data Reviewed:       Latest Ref Rng & Units 03/25/2023   11:47 PM 03/22/2023    4:22 AM 03/21/2023    4:23 AM  CBC  WBC 4.0 - 10.5 K/uL 8.9  10.9  7.7   Hemoglobin 12.0 - 15.0 g/dL 29.5  62.1  30.8   Hematocrit 36.0 - 46.0 % 38.2  34.9  33.6   Platelets 150 - 400 K/uL 247  235  226       Latest Ref Rng & Units 03/25/2023   11:47 PM 03/22/2023    4:22 AM 03/21/2023    4:23 AM  CMP  Glucose 70 - 99 mg/dL 657  846  962   BUN 8 - 23 mg/dL 19  21  17    Creatinine 0.44 - 1.00 mg/dL 9.52  8.41  3.24   Sodium 135 - 145 mmol/L 122  131  130   Potassium 3.5 - 5.1 mmol/L 3.6  3.7  3.1   Chloride 98 - 111 mmol/L 92  102  103   CO2 22 - 32 mmol/L 18  20  18    Calcium 8.9 - 10.3 mg/dL 9.1  9.0  8.6   Total Protein 6.5 - 8.1  g/dL 7.1  6.8    Total Bilirubin 0.3 - 1.2 mg/dL 1.0  0.5    Alkaline Phos 38 - 126 U/L 50  50    AST 15 - 41 U/L  23  32    ALT 0 - 44 U/L 27  29       Assessment and Plan: * Hyponatremia Mild confusion + recurrent hyponatremia. Low urine sodium and osm during last admit.  Suspicion was that she was drinking too much free water. Will fluid restrict pt to / day once again, since this worked to resolve her hyponatremia during last admission. Repeat BMP q6h to trend. Replace Mg Will repeat urine sodium and osm since she says she reduced free water intake but sodium still this low.  Wasn't consistent with SIADH last admit though: (urine sodium on admit of 26 and next day of < 10 suggests appropriate dilution of urine and retention of sodium by kidney in a patient with hyponatremia, osms 134 and 130)  Type 2 diabetes mellitus with other specified complication (HCC) Hold metformin for the moment Sensitive SSI AC  Essential hypertension Cont home BP meds Normal PRC with very low PRA.  Might want to consult / curbside endocrine in AM. (Primary hyperaldosteronism would explain her severe HTN + hypokalemia last admit, shouldn't explain the hyponatremia though?).      Advance Care Planning:   Code Status: Full Code  Consults: None  Family Communication: Husband at bedside  Severity of Illness: The appropriate patient status for this patient is OBSERVATION. Observation status is judged to be reasonable and necessary in order to provide the required intensity of service to ensure the patient's safety. The patient's presenting symptoms, physical exam findings, and initial radiographic and laboratory data in the context of their medical condition is felt to place them at decreased risk for further clinical deterioration. Furthermore, it is anticipated that the patient will be medically stable for discharge from the hospital within 2 midnights of admission.   Author: Hillary Bow.,  DO 03/26/2023 2:17 AM  For on call review [email or web address].

## 2023-03-26 NOTE — Progress Notes (Signed)
Subjective: Patient admitted this morning, see detailed H&P by dr Julian Reil 77 year old female with medical history of hypertension, diabetes mellitus type 2 admitted to hospital after she had nausea and vomiting.  Patient was recently discharged from the hospital after she was treated for hyponatremia and hypertensive urgency.  In the ED she was again found to be hyponatremic with sodium 122.  She denies drinking excessive water.  Vitals:   03/26/23 0408 03/26/23 1251  BP: (!) 153/72 123/60  Pulse: 72 62  Resp: 20 18  Temp: (!) 97.4 F (36.3 C) 97.8 F (36.6 C)  SpO2: 100% 98%      A/P Recurrent hyponatremia -Sodium is improved to 127  -Likely from polydipsia versus SSRI -Nephrology consultation for treatment for recurrent hyponatremia -Fluid restriction 1200 cc/day  Hypertension/primary hyperaldosteronism -Aldosterone/PRA ratio elevated at 55.7 -Plasma renin activity less than 0.167 -Nephrology consulted  Hypokalemia -Likely in setting of primary hyperaldosteronism as above -Replace potassium and follow BMP in am  Diabetes mellitus type 2 -Metformin on hold -Continue sliding scale insulin with NovoLog  Hypomagnesemia -Magnesium replaced, follow mag level in a.m.  Meredeth Ide [facility]

## 2023-03-26 NOTE — TOC CM/SW Note (Signed)
  Transition of Care [facility]) Screening Note   Patient Details  Name: Cindy Rhodes Date of Birth: [birth year]   Transition of Care [facility]) CM/SW Contact:    Howell Rucks, RN Phone Number: 03/26/2023, 10:27 AM    Transition of Care Department [facility]) has reviewed patient and no TOC needs have been identified at this time. We will continue to monitor patient advancement through interdisciplinary progression rounds. If new patient transition needs arise, please place a TOC consult.

## 2023-03-26 NOTE — Assessment & Plan Note (Signed)
Hold metformin for the moment Sensitive SSI AC

## 2023-03-26 NOTE — Assessment & Plan Note (Addendum)
Mild confusion + recurrent hyponatremia. Low urine sodium and osm during last admit.  Suspicion was that she was drinking too much free water. Will fluid restrict pt to / day once again, since this worked to resolve her hyponatremia during last admission. Repeat BMP q6h to trend. Replace Mg Will repeat urine sodium and osm since she says she reduced free water intake but sodium still this low.  Wasn't consistent with SIADH last admit though: (urine sodium on admit of 26 and next day of < 10 suggests appropriate dilution of urine and retention of sodium by kidney in a patient with hyponatremia, osms 134 and 130)

## 2023-03-27 DIAGNOSIS — I10 Essential (primary) hypertension: Secondary | ICD-10-CM | POA: Diagnosis present

## 2023-03-27 DIAGNOSIS — Z882 Allergy status to sulfonamides status: Secondary | ICD-10-CM | POA: Diagnosis not present

## 2023-03-27 DIAGNOSIS — E1169 Type 2 diabetes mellitus with other specified complication: Secondary | ICD-10-CM | POA: Diagnosis not present

## 2023-03-27 DIAGNOSIS — E876 Hypokalemia: Secondary | ICD-10-CM | POA: Diagnosis present

## 2023-03-27 DIAGNOSIS — Z8249 Family history of ischemic heart disease and other diseases of the circulatory system: Secondary | ICD-10-CM | POA: Diagnosis not present

## 2023-03-27 DIAGNOSIS — E871 Hypo-osmolality and hyponatremia: Secondary | ICD-10-CM | POA: Diagnosis not present

## 2023-03-27 DIAGNOSIS — F32A Depression, unspecified: Secondary | ICD-10-CM | POA: Diagnosis present

## 2023-03-27 DIAGNOSIS — Z79899 Other long term (current) drug therapy: Secondary | ICD-10-CM | POA: Diagnosis not present

## 2023-03-27 DIAGNOSIS — Z87891 Personal history of nicotine dependence: Secondary | ICD-10-CM | POA: Diagnosis not present

## 2023-03-27 DIAGNOSIS — Z7984 Long term (current) use of oral hypoglycemic drugs: Secondary | ICD-10-CM | POA: Diagnosis not present

## 2023-03-27 DIAGNOSIS — Z888 Allergy status to other drugs, medicaments and biological substances status: Secondary | ICD-10-CM | POA: Diagnosis not present

## 2023-03-27 DIAGNOSIS — E2609 Other primary hyperaldosteronism: Secondary | ICD-10-CM | POA: Diagnosis present

## 2023-03-27 DIAGNOSIS — R112 Nausea with vomiting, unspecified: Secondary | ICD-10-CM | POA: Diagnosis present

## 2023-03-27 DIAGNOSIS — R631 Polydipsia: Secondary | ICD-10-CM | POA: Diagnosis present

## 2023-03-27 DIAGNOSIS — I160 Hypertensive urgency: Secondary | ICD-10-CM | POA: Diagnosis present

## 2023-03-27 DIAGNOSIS — Z91014 Allergy to mammalian meats: Secondary | ICD-10-CM | POA: Diagnosis not present

## 2023-03-27 LAB — BASIC METABOLIC PANEL
Anion gap: 10 (ref 5–15)
Anion gap: 8 (ref 5–15)
BUN: 23 mg/dL (ref 8–23)
BUN: 25 mg/dL — ABNORMAL HIGH (ref 8–23)
CO2: 17 mmol/L — ABNORMAL LOW (ref 22–32)
CO2: 18 mmol/L — ABNORMAL LOW (ref 22–32)
Calcium: 8.6 mg/dL — ABNORMAL LOW (ref 8.9–10.3)
Calcium: 8.8 mg/dL — ABNORMAL LOW (ref 8.9–10.3)
Chloride: 103 mmol/L (ref 98–111)
Chloride: 98 mmol/L (ref 98–111)
Creatinine, Ser: 0.89 mg/dL (ref 0.44–1.00)
Creatinine, Ser: 0.91 mg/dL (ref 0.44–1.00)
GFR, Estimated: >60 mL/min (ref >60)
GFR, Estimated: >60 mL/min (ref >60)
Glucose, Bld: 107 mg/dL — ABNORMAL HIGH (ref 70–99)
Glucose, Bld: 113 mg/dL — ABNORMAL HIGH (ref 70–99)
Potassium: 4.1 mmol/L (ref 3.5–5.1)
Potassium: 4.3 mmol/L (ref 3.5–5.1)
Sodium: 125 mmol/L — ABNORMAL LOW (ref 135–145)
Sodium: 129 mmol/L — ABNORMAL LOW (ref 135–145)

## 2023-03-27 LAB — GLUCOSE, CAPILLARY
Glucose-Capillary: 119 mg/dL — ABNORMAL HIGH (ref 70–99)
Glucose-Capillary: 219 mg/dL — ABNORMAL HIGH (ref 70–99)
Glucose-Capillary: 104 mg/dL — ABNORMAL HIGH (ref 70–99)
Glucose-Capillary: 284 mg/dL — ABNORMAL HIGH (ref 70–99)

## 2023-03-27 LAB — MAGNESIUM: Magnesium: 2 mg/dL (ref 1.7–2.4)

## 2023-03-27 MED ORDER — BISACODYL 5 MG PO TBEC
5.0000 mg | DELAYED_RELEASE_TABLET | Freq: Every day | ORAL | Status: DC | PRN
  Administered 2023-03-27: 5 mg via ORAL
  Filled 2023-03-27: qty 1

## 2023-03-27 NOTE — Progress Notes (Addendum)
[facility]  PROGRESS NOTE  Cindy CHAVERO WUJ:[id number] DOB: 22-Nov-1946 DOA: 03/25/2023 PCP: Renford Dills, MD   Brief HPI:   77 year old female with medical history of hypertension, diabetes mellitus type 2 admitted to hospital after she had nausea and vomiting.  Patient was recently discharged from the hospital after she was treated for hyponatremia and hypertensive urgency.  In the ED she was again found to be hyponatremic with sodium 122.  She denies drinking excessive water.      Subjective   Patient seen and examined, denies any complaints.  Sodium slowly improving.  Today sodium is 129.   Assessment/Plan:    Recurrent hyponatremia -Sodium is improved to 127  -Likely from polydipsia versus SSRI -Nephrology consultation obtained for treatment for recurrent hyponatremia -Labs not consistent with SIADH, likely has reset osmostat -Continue fluid restriction 1200 cc/day   Hypertension/primary hyperaldosteronism -Aldosterone/PRA ratio elevated at 55.7 -Plasma renin activity less than 0.167 -Started on Aldactone per nephrology   Hypokalemia -Replete   Diabetes mellitus type 2 -Metformin on hold -Continue sliding scale insulin with NovoLog   Hypomagnesemia -Replete    Medications     amLODipine  5 mg Oral Daily   enoxaparin (LOVENOX) injection  40 mg Subcutaneous Q24H   famotidine  20 mg Oral Daily   insulin aspart  0-9 Units Subcutaneous TID WC   irbesartan  300 mg Oral Daily   metoprolol tartrate  25 mg Oral BID   multivitamin with minerals  1 tablet Oral Daily   oxybutynin  10 mg Oral Daily   rosuvastatin  2.5 mg Oral QHS   sertraline  75 mg Oral Daily   spironolactone  12.5 mg Oral Daily     Data Reviewed:   CBG:  Recent Labs  Lab 03/26/23 1614 03/26/23 2201 03/27/23 0742 03/27/23 1157 03/27/23 1553  GLUCAP 158* 120* 119* 219* 104*    SpO2: 97 %    Vitals:   03/26/23 1251 03/26/23 2159 03/27/23 0621 03/27/23 1332  BP: 123/60 (!)  146/72 (!) 157/82 120/65  Pulse: 62 68 (!) 59 61  Resp: 18 18 18 18   Temp: 97.8 F (36.6 C) 98 F (36.7 C) 97.8 F (36.6 C) 98.2 F (36.8 C)  TempSrc: Oral Oral Oral Oral  SpO2: 98% 98% 100% 97%  Weight:      Height:          Data Reviewed:  Basic Metabolic Panel: Recent Labs  Lab 03/21/23 0423 03/22/23 0422 03/25/23 2347 03/26/23 0604 03/26/23 1146 03/26/23 1834 03/26/23 2359 03/27/23 0716  NA 130* 131* 122* 127* 125* 126* 125* 129*  K 3.1* 3.7 3.6 3.0* 3.1* 4.4 4.3 4.1  CL 103 102 92* 100 98 98 98 103  CO2 18* 20* 18* 16* 17* 20* 17* 18*  GLUCOSE 114* 139* 159* 132* 159* 75 113* 107*  BUN 17 21 19 17 19 23  25* 23  CREATININE 0.94 0.79 0.87 0.81 0.93 0.91 0.91 0.89  CALCIUM 8.6* 9.0 9.1 8.6* 8.5* 8.7* 8.6* 8.8*  MG 1.9 1.8 1.6*  --   --   --  2.0  --   PHOS  --  3.9  --   --   --   --   --   --     CBC: Recent Labs  Lab 03/21/23 0423 03/22/23 0422 03/25/23 2347  WBC 7.7 10.9* 8.9  NEUTROABS  --  8.2* 7.2  HGB 11.2* 11.9* 12.3  HCT 33.6* 34.9* 38.2  MCV 88.2 86.4 93.4  PLT 226 235 247    LFT Recent Labs  Lab 03/22/23 0422 03/25/23 2347  AST 32 23  ALT 29 27  ALKPHOS 50 50  BILITOT 0.5 1.0  PROT 6.8 7.1  ALBUMIN 4.1 4.2     Antibiotics: Anti-infectives (From admission, onward)    None        DVT prophylaxis: Lovenox  Code Status: Full code  Family Communication: Discussed with patient's husband at bedside   CONSULTS nephrology   Objective    Physical Examination:   General-appears in no acute distress Heart-S1-S2, regular, no murmur auscultated Lungs-clear to auscultation bilaterally, no wheezing or crackles auscultated Abdomen-soft, nontender, no organomegaly Extremities-no edema in the lower extremities Neuro-alert, oriented x3, no focal deficit noted   Status is: Inpatient:             Meredeth Ide   [facility] If 7PM-7AM, please contact night-coverage at [email or web address], Office   [phone]   03/27/2023, 4:26 PM  LOS: 0 days

## 2023-03-27 NOTE — Progress Notes (Signed)
Subjective:  sodium is up to 129 this AM-  only action taken was to fluid restrict-  did not get the aldactone last night but did get today   Objective Vital signs in last 24 hours: Vitals:   03/26/23 0408 03/26/23 1251 03/26/23 2159 03/27/23 0621  BP: (!) 153/72 123/60 (!) 146/72 (!) 157/82  Pulse: 72 62 68 (!) 59  Resp: 20 18 18 18   Temp: (!) 97.4 F (36.3 C) 97.8 F (36.6 C) 98 F (36.7 C) 97.8 F (36.6 C)  TempSrc: Oral Oral Oral Oral  SpO2: 100% 98% 98% 100%  Weight:      Height:       Weight change:   Intake/Output Summary (Last 24 hours) at 03/27/2023 1137 Last data filed at 03/27/2023 0940 Gross per 24 hour  Intake 360 ml  Output --  Net 360 ml    Assessment/Plan: 77 year old female with very chronic hyponatremia and newer onset hypertension with hypokalemia  1.  Hyponatremia-is very longstanding which would be more consistent with a reset osmostat or SIADH.  Interestingly, her urine osmolality is not consistent with SIADH at least at this time, or the last 3 times it has been checked.  She does give a history of excessive water intake and possibly slightly less solute intake.  During her last hospitalization her sodium improved with only fluid restriction.  Therefore, this seems to be the mechanism of action.  I looked at her medication list and she is on NSAIDs which can occasionally be an etiology for hyponatremia.  It does not seem like she is received lately so I will just discontinue it.  Will also institute fluid restriction of 1200 cc and follow her sodium level.  She is improving with only fluid restriction- this point is emphasized with the pt and her husband 2.  Hypertension with hypokalemia- seems to be consistent with hyperaldosteronism which would not give hyponatremia.  In these patients Aldactone can be quite instrumental in bringing blood pressure down.  Therefore, I have started a small dose of that and decreased Norvasc and stop hydralazine as I anticipate  blood pressure will be better on the Aldactone and we will use that to titrate up which will also help keep her potassium up.  I agree with the ARB for the same reason.  I would not necessarily chase down an aldosterone secreting tumor because of her age we would not treat this with anything like surgery-and if aldosterone can be blocked it will not have another impact on her health  3. Dispo-  I think maybe should watch one more day and see what sodium does tomorrow just because she had back to back hosps-  dont want to send out too soon.  Pts son and daughter in law want her to see a nephrologist at [facility] upon discharge which is fine with me-  if sodium is same or better and BP is adequate would be OK with discharge tomorrow      Cecille Aver    Labs: Basic Metabolic Panel: Recent Labs  Lab 03/22/23 0422 03/25/23 2347 03/26/23 1834 03/26/23 2359 03/27/23 0716  NA 131*   < > 126* 125* 129*  K 3.7   < > 4.4 4.3 4.1  CL 102   < > 98 98 103  CO2 20*   < > 20* 17* 18*  GLUCOSE 139*   < > 75 113* 107*  BUN 21   < > 23 25* 23  CREATININE 0.79   < >  0.91 0.91 0.89  CALCIUM 9.0   < > 8.7* 8.6* 8.8*  PHOS 3.9  --   --   --   --    < > = values in this interval not displayed.   Liver Function Tests: Recent Labs  Lab 03/22/23 0422 03/25/23 2347  AST 32 23  ALT 29 27  ALKPHOS 50 50  BILITOT 0.5 1.0  PROT 6.8 7.1  ALBUMIN 4.1 4.2   Recent Labs  Lab 03/25/23 2347  LIPASE 39   No results for input(s): "AMMONIA" in the last 168 hours. CBC: Recent Labs  Lab 03/21/23 0423 03/22/23 0422 03/25/23 2347  WBC 7.7 10.9* 8.9  NEUTROABS  --  8.2* 7.2  HGB 11.2* 11.9* 12.3  HCT 33.6* 34.9* 38.2  MCV 88.2 86.4 93.4  PLT 226 235 247   Cardiac Enzymes: No results for input(s): "CKTOTAL", "CKMB", "CKMBINDEX", "TROPONINI" in the last 168 hours. CBG: Recent Labs  Lab 03/26/23 0741 03/26/23 1112 03/26/23 1614 03/26/23 2201 03/27/23 0742  GLUCAP 125* 194* 158* 120* 119*     Iron Studies: No results for input(s): "IRON", "TIBC", "TRANSFERRIN", "FERRITIN" in the last 72 hours. Studies/Results: No results found. Medications: Infusions:   Scheduled Medications:  amLODipine  5 mg Oral Daily   enoxaparin (LOVENOX) injection  40 mg Subcutaneous Q24H   famotidine  20 mg Oral Daily   insulin aspart  0-9 Units Subcutaneous TID WC   irbesartan  300 mg Oral Daily   metoprolol tartrate  25 mg Oral BID   multivitamin with minerals  1 tablet Oral Daily   oxybutynin  10 mg Oral Daily   rosuvastatin  2.5 mg Oral QHS   sertraline  75 mg Oral Daily   spironolactone  12.5 mg Oral Daily    have reviewed scheduled and prn medications.  Physical Exam: General:  NAD-  thin nervous Heart: RRR Lungs: mostly clear Abdomen: soft, non tender Extremities: no edema     03/27/2023,11:37 AM  LOS: 0 days

## 2023-03-28 DIAGNOSIS — E1169 Type 2 diabetes mellitus with other specified complication: Secondary | ICD-10-CM | POA: Diagnosis not present

## 2023-03-28 DIAGNOSIS — E871 Hypo-osmolality and hyponatremia: Secondary | ICD-10-CM | POA: Diagnosis not present

## 2023-03-28 DIAGNOSIS — I10 Essential (primary) hypertension: Secondary | ICD-10-CM | POA: Diagnosis not present

## 2023-03-28 LAB — RENAL FUNCTION PANEL
Albumin: 3.5 g/dL (ref 3.5–5.0)
Anion gap: 11 (ref 5–15)
BUN: 23 mg/dL (ref 8–23)
CO2: 20 mmol/L — ABNORMAL LOW (ref 22–32)
Calcium: 8.6 mg/dL — ABNORMAL LOW (ref 8.9–10.3)
Chloride: 100 mmol/L (ref 98–111)
Creatinine, Ser: 0.94 mg/dL (ref 0.44–1.00)
GFR, Estimated: >60 mL/min (ref >60)
Glucose, Bld: 110 mg/dL — ABNORMAL HIGH (ref 70–99)
Phosphorus: 3.6 mg/dL (ref 2.5–4.6)
Potassium: 3.5 mmol/L (ref 3.5–5.1)
Sodium: 131 mmol/L — ABNORMAL LOW (ref 135–145)

## 2023-03-28 LAB — GLUCOSE, CAPILLARY: Glucose-Capillary: 120 mg/dL — ABNORMAL HIGH (ref 70–99)

## 2023-03-28 MED ORDER — SPIRONOLACTONE 25 MG PO TABS: 12.5000 mg | ORAL_TABLET | Freq: Every day | ORAL | 2 refills | Status: DC

## 2023-03-28 MED ORDER — AMLODIPINE BESYLATE 5 MG PO TABS: 5.0000 mg | ORAL_TABLET | Freq: Every day | ORAL | 1 refills | Status: DC

## 2023-03-28 NOTE — Progress Notes (Signed)
Subjective:  sodium is up to 131 this AM-  only action taken was to fluid restrict-   got the aldactone yesterday and today   Objective Vital signs in last 24 hours: Vitals:   03/27/23 0621 03/27/23 1332 03/27/23 2030 03/28/23 0634  BP: (!) 157/82 120/65 (!) 165/87 (!) 154/76  Pulse: (!) 59 61 73 (!) 59  Resp: 18 18 18 18   Temp: 97.8 F (36.6 C) 98.2 F (36.8 C) 98.2 F (36.8 C) 99.3 F (37.4 C)  TempSrc: Oral Oral Oral Oral  SpO2: 100% 97% 100% 100%  Weight:      Height:       Weight change:   Intake/Output Summary (Last 24 hours) at 03/28/2023 1005 Last data filed at 03/28/2023 0732 Gross per 24 hour  Intake 600 ml  Output --  Net 600 ml    Assessment/Plan: 77 year old female with very chronic hyponatremia and newer onset hypertension with hypokalemia  1.  Hyponatremia-is very longstanding which would be more consistent with a reset osmostat or SIADH.  Interestingly, her urine osmolality is not consistent with SIADH at least at this time, or the last 3 times it has been checked.  She does give a history of excessive water intake and possibly slightly less solute intake.  During her last hospitalization her sodium improved with only fluid restriction.  Therefore, this seems to be the mechanism of action.  I looked at her medication list and she is on NSAIDs which can occasionally be an etiology for hyponatremia.  It does not seem like she is received lately so I will just discontinue it.  Will also institute fluid restriction of 1200 cc and follow her sodium level.  She has improving with only fluid restriction- this point is emphasized with the pt and her husband 2.  Hypertension with hypokalemia- seems to be consistent with hyperaldosteronism which would not give hyponatremia.  In these patients Aldactone can be quite instrumental in bringing blood pressure down.  Therefore, I have started a small dose of that and decreased Norvasc and stop hydralazine as I anticipate blood pressure  will be better on the Aldactone.   As an OP this can be  titrated up which will also help keep her potassium up.  I agree with the ARB for the same reason.  I would not necessarily chase down an aldosterone secreting tumor because of her age we would not treat this with anything like surgery-and if aldosterone can be blocked it will not have another impact on her health  3. Dispo-  ready for discharge-  Pts son and daughter in law want her to see a nephrologist at [facility] upon discharge which is fine with me-     Cindy Rhodes    Labs: Basic Metabolic Panel: Recent Labs  Lab 03/22/23 0422 03/25/23 2347 03/26/23 2359 03/27/23 0716 03/28/23 0601  NA 131*   < > 125* 129* 131*  K 3.7   < > 4.3 4.1 3.5  CL 102   < > 98 103 100  CO2 20*   < > 17* 18* 20*  GLUCOSE 139*   < > 113* 107* 110*  BUN 21   < > 25* 23 23  CREATININE 0.79   < > 0.91 0.89 0.94  CALCIUM 9.0   < > 8.6* 8.8* 8.6*  PHOS 3.9  --   --   --  3.6   < > = values in this interval not displayed.   Liver Function Tests: Recent  Labs  Lab 03/22/23 0422 03/25/23 2347 03/28/23 0601  AST 32 23  --   ALT 29 27  --   ALKPHOS 50 50  --   BILITOT 0.5 1.0  --   PROT 6.8 7.1  --   ALBUMIN 4.1 4.2 3.5   Recent Labs  Lab 03/25/23 2347  LIPASE 39   No results for input(s): "AMMONIA" in the last 168 hours. CBC: Recent Labs  Lab 03/22/23 0422 03/25/23 2347  WBC 10.9* 8.9  NEUTROABS 8.2* 7.2  HGB 11.9* 12.3  HCT 34.9* 38.2  MCV 86.4 93.4  PLT 235 247   Cardiac Enzymes: No results for input(s): "CKTOTAL", "CKMB", "CKMBINDEX", "TROPONINI" in the last 168 hours. CBG: Recent Labs  Lab 03/27/23 0742 03/27/23 1157 03/27/23 1553 03/27/23 2033 03/28/23 0726  GLUCAP 119* 219* 104* 284* 120*    Iron Studies: No results for input(s): "IRON", "TIBC", "TRANSFERRIN", "FERRITIN" in the last 72 hours. Studies/Results: No results found. Medications: Infusions:   Scheduled Medications:  amLODipine  5 mg Oral  Daily   enoxaparin (LOVENOX) injection  40 mg Subcutaneous Q24H   famotidine  20 mg Oral Daily   insulin aspart  0-9 Units Subcutaneous TID WC   irbesartan  300 mg Oral Daily   metoprolol tartrate  25 mg Oral BID   multivitamin with minerals  1 tablet Oral Daily   oxybutynin  10 mg Oral Daily   rosuvastatin  2.5 mg Oral QHS   sertraline  75 mg Oral Daily   spironolactone  12.5 mg Oral Daily    have reviewed scheduled and prn medications.  Physical Exam: General:  NAD-  thin nervous Heart: RRR Lungs: mostly clear Abdomen: soft, non tender Extremities: no edema     03/28/2023,10:05 AM  LOS: 1 day

## 2023-03-28 NOTE — Discharge Summary (Signed)
Physician Discharge Summary   Patient: Cindy Rhodes MRN: [id number] DOB: September 18, 1946  Admit date:     03/25/2023  Discharge date: 03/28/23  Discharge Physician: Meredeth Ide   PCP: Renford Dills, MD   Recommendations at discharge:   Follow-up PCP in 2 weeks Follow-up with nephrology at [facility], patient's PCP will make referral.  Discharge Diagnoses: Principal Problem:   Hyponatremia Active Problems:   Essential hypertension   Type 2 diabetes mellitus with other specified complication (HCC)  Resolved Problems:   * No resolved hospital problems. *  Hospital Course:  77 year old female with medical history of hypertension, diabetes mellitus type 2 admitted to hospital after she had nausea and vomiting.  Patient was recently discharged from the hospital after she was treated for hyponatremia and hypertensive urgency.  In the ED she was again found to be hyponatremic with sodium 122.  She denies drinking excessive water.    Assessment and Plan:  Recurrent hyponatremia -Sodium is improved to 131 -Likely from polydipsia  -Nephrology consultation obtained for treatment for recurrent hyponatremia -Labs not consistent with SIADH, likely has reset osmostat -Continue fluid restriction 1200 cc/day   Hypertension/primary hyperaldosteronism -Aldosterone/PRA ratio elevated at 55.7 -Plasma renin activity less than 0.167 -Started on Aldactone per nephrology   Hypokalemia -Replete   Diabetes mellitus type 2 -Continue metformin   Hypomagnesemia -Replete   Patient wants to follow-up nephrology at [facility] as outpatient.  Her PCP will make referral for that.      Consultants: Nephrology Procedures performed:  Disposition: Home Diet recommendation:  Discharge Diet Orders (From admission, onward)     Start     Ordered   03/28/23 0000  Diet - low sodium heart healthy        03/28/23 1026           Regular diet DISCHARGE MEDICATION: Allergies as of 03/28/2023       Reactions    Hydrochlorothiazide Other (See Comments)   Unknown    Meloxicam Other (See Comments)   Dizzy and nervous    Pork-derived Products Other (See Comments)   Religious    Sulfa Antibiotics Other (See Comments)   Shuts down urinary system        Medication List     STOP taking these medications    hydrALAZINE 25 MG tablet Commonly known as: APRESOLINE   ibuprofen 200 MG tablet Commonly known as: ADVIL   Potassium Chloride ER 15 MEQ Tbcr       TAKE these medications    amLODipine 5 MG tablet Commonly known as: NORVASC Take 1 tablet (5 mg total) by mouth daily. What changed: how much to take   Calcium Carbonate-Vitamin D 600-400 MG-UNIT tablet Take 1 tablet by mouth daily.   CO Q 10 PO Take 1 capsule by mouth daily.   Crestor 5 MG tablet Generic drug: rosuvastatin Take 2.5 mg by mouth at bedtime.   famotidine 20 MG tablet Commonly known as: PEPCID Take 20 mg by mouth daily.   glucosamine-chondroitin 500-400 MG tablet Take 3 tablets by mouth daily.   metFORMIN 500 MG 24 hr tablet Commonly known as: GLUCOPHAGE-XR Take 1,000 mg by mouth 2 (two) times daily.   metoprolol tartrate 25 MG tablet Commonly known as: LOPRESSOR Take 25 mg by mouth 2 (two) times daily.   multivitamin with minerals Tabs tablet Take 1 tablet by mouth daily.   oxybutynin 10 MG 24 hr tablet Commonly known as: DITROPAN-XL Take 10 mg by mouth daily.   sertraline  50 MG tablet Commonly known as: ZOLOFT Take 75 mg by mouth daily.   spironolactone 25 MG tablet Commonly known as: ALDACTONE Take 0.5 tablets (12.5 mg total) by mouth daily. Start taking on: March 29, 2023   Tart Cherry Advanced Caps Take 1 capsule by mouth daily.   telmisartan 80 MG tablet Commonly known as: MICARDIS Take 1 tablet (80 mg total) by mouth daily.   Vitamin C 500 MG Caps Take 500 mg by mouth daily.        Discharge Exam: Filed Weights   03/25/23 2255  Weight: 61.8 kg   General-appears in no  acute distress Heart-S1-S2, regular, no murmur auscultated Lungs-clear to auscultation bilaterally, no wheezing or crackles auscultated Abdomen-soft, nontender, no organomegaly Extremities-no edema in the lower extremities Neuro-alert, oriented x3, no focal deficit noted  Condition at discharge: good  The results of significant diagnostics from this hospitalization (including imaging, microbiology, ancillary and laboratory) are listed below for reference.   Imaging Studies: VAS US RENAL ARTERY DUPLEX  Result Date: 03/22/2023 ABDOMINAL VISCERAL Patient Name:  Cindy Rhodes  Date of Exam:   03/22/2023 Medical Rec #: [id number]       Accession #:    [id number] Date of Birth: 02-17-1946        Patient Gender: F Patient Age:   23 years Exam Location:  [facility] Procedure:      VAS US RENAL ARTERY DUPLEX Referring Phys: [id number] A CALDWELL POWELL JR -------------------------------------------------------------------------------- Indications: Hypertension High Risk Factors: Hypertension, Diabetes. Limitations: Air/bowel gas. Comparison Study: No prior studies. Performing Technologist: Chanda Busing RVT  Examination Guidelines: A complete evaluation includes B-mode imaging, spectral Doppler, color Doppler, and power Doppler as needed of all accessible portions of each vessel. Bilateral testing is considered an integral part of a complete examination. Limited examinations for reoccurring indications may be performed as noted.  Duplex Findings: +--------------------+--------+--------+------+--------+ Mesenteric          PSV cm/sEDV cm/sPlaqueComments +--------------------+--------+--------+------+--------+ Aorta Mid             109                          +--------------------+--------+--------+------+--------+ Celiac Artery Origin   71                          +--------------------+--------+--------+------+--------+ SMA Proximal           92      8                    +--------------------+--------+--------+------+--------+    +------------------+--------+--------+-------+ Right Renal ArteryPSV cm/sEDV cm/sComment +------------------+--------+--------+-------+ Origin               74      14           +------------------+--------+--------+-------+ Proximal            122      23           +------------------+--------+--------+-------+ Mid                 223      58           +------------------+--------+--------+-------+ Distal              182      42           +------------------+--------+--------+-------+ +-----------------+--------+--------+-------+ Left Renal ArteryPSV cm/sEDV cm/sComment +-----------------+--------+--------+-------+ Origin  49      8            +-----------------+--------+--------+-------+ Proximal            50      11           +-----------------+--------+--------+-------+ Mid                 64      12           +-----------------+--------+--------+-------+ Distal              20      8            +-----------------+--------+--------+-------+ +------------+--------+--------+----+-----------+--------+--------+----+ Right KidneyPSV cm/sEDV cm/sRI  Left KidneyPSV cm/sEDV cm/sRI   +------------+--------+--------+----+-----------+--------+--------+----+ Upper Pole  19      5       0.74Upper Pole 19      6       0.70 +------------+--------+--------+----+-----------+--------+--------+----+ Mid         27      7       0.        23      4       0.81 +------------+--------+--------+----+-----------+--------+--------+----+ Lower Pole  29      8       0.71Lower Pole 21      6       0.72 +------------+--------+--------+----+-----------+--------+--------+----+ Hilar       33      7       0.80Hilar      58      14      0.76 +------------+--------+--------+----+-----------+--------+--------+----+ +------------------+----+------------------+----+ Right Kidney           Left Kidney            +------------------+----+------------------+----+ RAR                   RAR                    +------------------+----+------------------+----+ RAR (manual)      2.05RAR (manual)      0.59 +------------------+----+------------------+----+ Cortex                Cortex                 +------------------+----+------------------+----+ Cortex thickness      Corex thickness        +------------------+----+------------------+----+ Kidney length (cm)9.50Kidney length (cm)9.00 +------------------+----+------------------+----+  Summary: Renal:  Right: Normal size right kidney. Normal right Resisitive Index.        1-59% stenosis of the right renal artery. Left:  Normal size of left kidney. Normal left Resistive Index.        1-59% stenosis of the left renal artery.  *See table(s) above for measurements and observations.  Diagnosing physician: Lemar Livings MD  Electronically signed by Lemar Livings MD on 03/22/2023 at 3:00:54 PM.    Final    DG CHEST PORT 1 VIEW  Result Date: 03/19/2023 CLINICAL DATA:  Hyponatremia EXAM: PORTABLE CHEST 1 VIEW COMPARISON:  Radiographs 04/02/2014 FINDINGS: The heart size and mediastinal contours are within normal limits. Both lungs are clear. The visualized skeletal structures are unremarkable. IMPRESSION: No active disease. Electronically Signed   By: Minerva Fester M.D.   On: 03/19/2023 23:31   ECHOCARDIOGRAM COMPLETE  Result Date: 03/19/2023    ECHOCARDIOGRAM REPORT   Patient Name:   KARELYN BRISBY Fralix Date of Exam: 03/19/2023 Medical Rec #:  [id number]  Height:       62.0 in Accession #:    [id number]     Weight:       141.1 lb Date of Birth:  25-May-1946       BSA:          1.648 m Patient Age:    76 years       BP:           164/86 mmHg Patient Gender: F              HR:           64 bpm. Exam Location:  Inpatient Procedure: 2D Echo, Color Doppler, Cardiac Doppler and Intracardiac            Opacification Agent Indications:     R00.8 Other abnormalities of heart beat  History:        Patient has no prior history of Echocardiogram examinations.                 Risk Factors:Diabetes and Hypertension.  Sonographer:    Milbert Coulter Referring Phys: [phone] A CALDWELL POWELL JR  Sonographer Comments: Suboptimal apical window. Dense breast tissue. IMPRESSIONS  1. Left ventricular ejection fraction, by estimation, is 55 to 60%. The left ventricle has normal function. The left ventricle has no regional wall motion abnormalities. There is mild concentric left ventricular hypertrophy. Left ventricular diastolic parameters are consistent with Grade I diastolic dysfunction (impaired relaxation).  2. Right ventricular systolic function is normal. The right ventricular size is normal. There is normal pulmonary artery systolic pressure. The estimated right ventricular systolic pressure is 24.3 mmHg.  3. Left atrial size was mildly dilated.  4. The mitral valve is normal in structure. No evidence of mitral valve regurgitation. No evidence of mitral stenosis.  5. The aortic valve is tricuspid. Aortic valve regurgitation is not visualized. No aortic stenosis is present.  6. The inferior vena cava is normal in size with greater than 50% respiratory variability, suggesting right atrial pressure of 3 mmHg. FINDINGS  Left Ventricle: Left ventricular ejection fraction, by estimation, is 55 to 60%. The left ventricle has normal function. The left ventricle has no regional wall motion abnormalities. Definity contrast agent was given IV to delineate the left ventricular  endocardial borders. The left ventricular internal cavity size was normal in size. There is mild concentric left ventricular hypertrophy. Left ventricular diastolic parameters are consistent with Grade I diastolic dysfunction (impaired relaxation). Right Ventricle: The right ventricular size is normal. No increase in right ventricular wall thickness. Right ventricular systolic function is normal.  There is normal pulmonary artery systolic pressure. The tricuspid regurgitant velocity is 2.31 m/s, and  with an assumed right atrial pressure of 3 mmHg, the estimated right ventricular systolic pressure is 24.3 mmHg. Left Atrium: Left atrial size was mildly dilated. Right Atrium: Right atrial size was normal in size. Pericardium: There is no evidence of pericardial effusion. Mitral Valve: The mitral valve is normal in structure. No evidence of mitral valve regurgitation. No evidence of mitral valve stenosis. Tricuspid Valve: The tricuspid valve is normal in structure. Tricuspid valve regurgitation is trivial. Aortic Valve: The aortic valve is tricuspid. Aortic valve regurgitation is not visualized. No aortic stenosis is present. Pulmonic Valve: The pulmonic valve was normal in structure. Pulmonic valve regurgitation is not visualized. Aorta: The aortic root is normal in size and structure. Venous: The inferior vena cava is normal in size with greater than 50% respiratory variability, suggesting right atrial  pressure of 3 mmHg. IAS/Shunts: No atrial level shunt detected by color flow Doppler.  LEFT VENTRICLE PLAX 2D LVIDd:         4.10 cm Diastology LVIDs:         2.70 cm LV e' medial:    6.20 cm/s LV PW:         1.30 cm LV E/e' medial:  8.0 LV IVS:        1.30 cm LV e' lateral:   11.60 cm/s                        LV E/e' lateral: 4.2  RIGHT VENTRICLE RV S prime:     13.90 cm/s LEFT ATRIUM           Index LA diam:      3.50 cm 2.12 cm/m LA Vol (A4C): 35.4 ml 21.48 ml/m   AORTA Ao Root diam: 2.60 cm MITRAL VALVE               TRICUSPID VALVE MV Area (PHT): 2.95 cm    TR Peak grad:   21.3 mmHg MV Decel Time: 257 msec    TR Vmax:        231.00 cm/s MV E velocity: 49.30 cm/s MV A velocity: 73.30 cm/s MV E/A ratio:  0.67 Dalton McleanMD Electronically signed by Wilfred Lacy Signature Date/Time: 03/19/2023/3:56:13 PM    Final    MR BRAIN WO CONTRAST  Result Date: 03/18/2023 CLINICAL DATA:  Altered mental status,  speech difficulty EXAM: MRI HEAD WITHOUT CONTRAST TECHNIQUE: Multiplanar, multiecho pulse sequences of the brain and surrounding structures were obtained without intravenous contrast. COMPARISON:  No prior MRI available, correlation is made with CT head 03/18/2023 FINDINGS: Brain: No restricted diffusion to suggest acute or subacute infarct. No acute hemorrhage, mass, mass effect, or midline shift. No hydrocephalus or extra-axial collection. Normal pituitary and craniocervical junction. No hemosiderin deposition to suggest remote hemorrhage. Moderately advanced cerebral atrophy for age peer confluent T2 hyperintense signal in the periventricular white matter, likely the sequela of moderate to severe chronic small vessel ischemic disease. Vascular: Normal arterial flow voids. Skull and upper cervical spine: Normal marrow signal. Sinuses/Orbits: Clear paranasal sinuses. No acute finding in the orbits. Other: Trace fluid in right mastoid air cells. IMPRESSION: No acute intracranial process. No evidence of acute or subacute infarct. Electronically Signed   By: Wiliam Ke M.D.   On: 03/18/2023 13:13   CT Head Wo Contrast  Result Date: 03/18/2023 CLINICAL DATA:  Altered mental status EXAM: CT HEAD WITHOUT CONTRAST TECHNIQUE: Contiguous axial images were obtained from the base of the skull through the vertex without intravenous contrast. RADIATION DOSE REDUCTION: This exam was performed according to the departmental dose-optimization program which includes automated exposure control, adjustment of the mA and/or kV according to patient size and/or use of iterative reconstruction technique. COMPARISON:  07/28/2019 FINDINGS: Brain: No evidence of acute infarction, hemorrhage, hydrocephalus, extra-axial collection or mass lesion/mass effect. Subcortical white matter and periventricular small vessel ischemic changes. Global cortical atrophy. Vascular: Mild intracranial atherosclerosis. Skull: Normal. Negative for  fracture or focal lesion. Sinuses/Orbits: The visualized paranasal sinuses are essentially clear. The mastoid air cells are unopacified. Other: None. IMPRESSION: No acute intracranial abnormality. Atrophy with small vessel ischemic changes. Electronically Signed   By: Charline Bills M.D.   On: 03/18/2023 03:53    Microbiology: Results for orders placed or performed during the hospital encounter of 08/12/17  Urine Culture  Status: Abnormal   Collection Time: 08/12/17  8:34 PM   Specimen: Urine, Random  Result Value Ref Range Status   Specimen Description URINE, RANDOM  Final   Special Requests NONE  Final   Culture MULTIPLE SPECIES PRESENT, SUGGEST RECOLLECTION (A)  Final   Report Status 08/14/2017 FINAL  Final  Urine Culture     Status: None   Collection Time: 08/14/17  1:02 PM   Specimen: Urine, Random  Result Value Ref Range Status   Specimen Description URINE, RANDOM  Final   Special Requests NONE  Final   Culture   Final    NO GROWTH Performed at [facility], [address], [place], [place] [address]    Report Status 08/16/2017 FINAL  Final    Labs: CBC: Recent Labs  Lab 03/22/23 0422 03/25/23 2347  WBC 10.9* 8.9  NEUTROABS 8.2* 7.2  HGB 11.9* 12.3  HCT 34.9* 38.2  MCV 86.4 93.4  PLT 235 247   Basic Metabolic Panel: Recent Labs  Lab 03/22/23 0422 03/25/23 2347 03/26/23 0604 03/26/23 1146 03/26/23 1834 03/26/23 2359 03/27/23 0716 03/28/23 0601  NA 131* 122*   < > 125* 126* 125* 129* 131*  K 3.7 3.6   < > 3.1* 4.4 4.3 4.1 3.5  CL 102 92*   < > 98 98 98 103 100  CO2 20* 18*   < > 17* 20* 17* 18* 20*  GLUCOSE 139* 159*   < > 159* 75 113* 107* 110*  BUN 21 19   < > 19 23 25* 23 23  CREATININE 0.79 0.87   < > 0.93 0.91 0.91 0.89 0.94  CALCIUM 9.0 9.1   < > 8.5* 8.7* 8.6* 8.8* 8.6*  MG 1.8 1.6*  --   --   --  2.0  --   --   PHOS 3.9  --   --   --   --   --   --  3.6   < > = values in this interval not displayed.   Liver Function Tests: Recent  Labs  Lab 03/22/23 0422 03/25/23 2347 03/28/23 0601  AST 32 23  --   ALT 29 27  --   ALKPHOS 50 50  --   BILITOT 0.5 1.0  --   PROT 6.8 7.1  --   ALBUMIN 4.1 4.2 3.5   CBG: Recent Labs  Lab 03/27/23 0742 03/27/23 1157 03/27/23 1553 03/27/23 2033 03/28/23 0726  GLUCAP 119* 219* 104* 284* 120*    Discharge time spent: greater than 30 minutes.  Signed: Meredeth Ide, MD [facility] 03/28/2023

## 2023-03-30 DIAGNOSIS — E871 Hypo-osmolality and hyponatremia: Secondary | ICD-10-CM | POA: Diagnosis not present

## 2023-03-30 DIAGNOSIS — E876 Hypokalemia: Secondary | ICD-10-CM | POA: Diagnosis not present

## 2023-03-30 DIAGNOSIS — I10 Essential (primary) hypertension: Secondary | ICD-10-CM | POA: Diagnosis not present

## 2023-04-01 DIAGNOSIS — E876 Hypokalemia: Secondary | ICD-10-CM | POA: Diagnosis not present

## 2023-04-01 DIAGNOSIS — I10 Essential (primary) hypertension: Secondary | ICD-10-CM | POA: Diagnosis not present

## 2023-04-01 DIAGNOSIS — G9340 Encephalopathy, unspecified: Secondary | ICD-10-CM | POA: Diagnosis not present

## 2023-04-01 DIAGNOSIS — E871 Hypo-osmolality and hyponatremia: Secondary | ICD-10-CM | POA: Diagnosis not present

## 2023-04-05 ENCOUNTER — Ambulatory Visit: Payer: PPO | Admitting: Physical Therapy

## 2023-04-05 ENCOUNTER — Encounter: Payer: Self-pay | Admitting: Physical Therapy

## 2023-04-05 DIAGNOSIS — R262 Difficulty in walking, not elsewhere classified: Secondary | ICD-10-CM

## 2023-04-05 DIAGNOSIS — R2689 Other abnormalities of gait and mobility: Secondary | ICD-10-CM | POA: Diagnosis not present

## 2023-04-05 DIAGNOSIS — M6281 Muscle weakness (generalized): Secondary | ICD-10-CM

## 2023-04-05 NOTE — Therapy (Signed)
OUTPATIENT PHYSICAL THERAPY LOWER EXTREMITY EVALUATION   Patient Name: Cindy Rhodes MRN: [id number] DOB:07-03-1946, 77 y.o., female Today's Date: 04/05/2023  END OF SESSION:  PT End of Session - 04/05/23 1307     Visit Number 1    Number of Visits 12    Date for PT Re-Evaluation 05/31/23    PT Start Time 1308    PT Stop Time 1345    PT Time Calculation (min) 37 min    Activity Tolerance Patient tolerated treatment well    Behavior During Therapy [facility] for tasks assessed/performed             Past Medical History:  Diagnosis Date   Depression    Diabetes mellitus without complication (HCC)    Hypertension    Past Surgical History:  Procedure Laterality Date   ABDOMINAL HYSTERECTOMY     HAND SURGERY Left    Patient Active Problem List   Diagnosis Date Noted   Hypertensive crisis 03/18/2023   Acute encephalopathy 03/18/2023   Hypomagnesemia 03/18/2023   Prolonged QT interval 03/18/2023   Anxiety disorder 05/14/2021   Atrophy of vagina 05/14/2021   Decreased estrogen level 05/14/2021   Type 2 diabetes mellitus with other specified complication (HCC) 05/14/2021   Diabetic renal disease (HCC) 05/14/2021   Interstitial cystitis 05/14/2021   Leukocytosis 05/14/2021   Major depression, single episode, in complete remission (HCC) 05/14/2021   Migraine without aura, not refractory 05/14/2021   Osteopenia 05/14/2021   Pure hypercholesterolemia 05/14/2021   Subacute vulvitis 05/14/2021   Urge incontinence of urine 05/14/2021   Pincer nail deformity 01/24/2020   DDD (degenerative disc disease), cervical 10/13/2019   Spinal stenosis in cervical region 10/13/2019   Neck pain 08/28/2019   Pain in right hand 05/08/2019   Trigger finger of right hand 05/08/2019   Hypokalemia 08/12/2017   Hyponatremia 08/12/2017   Essential hypertension 08/12/2017   Type 2 diabetes mellitus with hyperglycemia (HCC) 08/12/2017   Acute lower UTI 08/12/2017    PCP: Renford Dills,  MD  REFERRING PROVIDER:   Zigmund Daniel., MD    REFERRING DIAG: R53.81 (ICD-10-CM) - Physical deconditioning   THERAPY DIAG:  Muscle weakness (generalized)  Difficulty in walking, not elsewhere classified  Balance problem  Rationale for Evaluation and Treatment: Rehabilitation  ONSET DATE: recent hospitalization 03/25/23  SUBJECTIVE:   SUBJECTIVE STATEMENT: States she has been having balance issues prior to her recent hospitalization. States she has been feeling weak since her recent hospitalization. States she can still do things but it is a challenging. States she feels off balance and needs to hold on to her husband at time when she walks. States her walking speed is slower. States she feels like she can walk without assistance for a couple minutes .  PERTINENT HISTORY: DB, HTN PAIN:  Are you having pain?no  PRECAUTIONS: Fall  WEIGHT BEARING RESTRICTIONS: No  FALLS:  Has patient fallen in last 6 months? no  LIVING ENVIRONMENT: Lives with: lives with their family and lives with their spouse  Has following equipment at home:  l[facility] - could easily get AD if needed.   PLOF: Independent  PATIENT GOALS: wants to be confident with walking   OBJECTIVE:      COGNITION: Overall cognitive status: Within functional limits for tasks assessed       POSTURE: flexed trunk       LE Measurements Lower Extremity Right 04/05/2023 Left 04/05/2023   A/PROM MMT A/PROM MMT  Hip Flexion      Hip Extension      Hip Abduction      Hip Adduction      Hip Internal rotation      Hip External rotation      Knee Flexion      Knee Extension      Ankle Dorsiflexion      Ankle Plantarflexion      Ankle Inversion      Ankle Eversion       (Blank rows = not tested) * pain    FUNCTIONAL TESTS:   STS 18.21 seconds - uncontrolled descent, no UE support   GAIT: Distance walked: 212 ft Assistive device utilized: None Level of assistance:  Modified independence Comments: with gait belt, feet apart, wide BOS at rest and slight cross over of right leg (leads with groin muscles. Practiced with cane too but difficult to coordinate   TODAY'S TREATMENT:                                                                                                                              DATE:   04/05/2023  Therapeutic Exercise:  Aerobic: Supine: Prone:  Seated: STS x5  Standing: Neuromuscular Re-education: Manual Therapy: Therapeutic Activity: Self Care: Trigger Point Dry Needling:  Modalities:    PATIENT EDUCATION:  Education details: on current presentation, on HEP, on clinical outcomes score and POC, on benefits of using cane/ practicing gait.  Person educated: Patient Education method: Explanation, Demonstration, and Handouts Education comprehension: verbalized understanding   HOME EXERCISE PROGRAM: [id number]  ASSESSMENT:  CLINICAL IMPRESSION: Patient presents to therapy with complaints of weakness and balance deficits that have been going on for a while but has been worse since recent hospitalization.  Session limited secondary to education and stressing importance of safety at home and in the community.  Patient presents with functional strength deficits as well as reduced 2-minute walk and overall gait speed.  Educated patient on findings as well as plan of care moving forward.  Patient would greatly benefit from skilled physical therapy to improve overall function and reduce fall risk at this time.  OBJECTIVE IMPAIRMENTS: Abnormal gait, decreased activity tolerance, decreased balance, decreased coordination, decreased endurance, decreased knowledge of use of DME, difficulty walking, decreased ROM, decreased strength, and pain.   ACTIVITY LIMITATIONS: bending, standing, squatting, transfers, and locomotion level  PARTICIPATION LIMITATIONS: community activity  PERSONAL FACTORS: Fitness are also affecting patient's  functional outcome.   REHAB POTENTIAL: Good  CLINICAL DECISION MAKING: Stable/uncomplicated  EVALUATION COMPLEXITY: Low   GOALS: Goals reviewed with patient? yes  SHORT TERM GOALS: Target date: 05/03/2023  Patient will be independent in self management strategies to improve quality of life and functional outcomes. Baseline: New Program Goal status: INITIAL  2.  Patient will report at least 50% improvement in overall symptoms and/or function to demonstrate improved functional mobility Baseline: 0% better Goal status: INITIAL  3.  Patient will be  able to demonstrate 5 times sit to stand in less than 13 seconds to demonstrate improved functional strength in lower extremities Baseline: See above Goal status: INITIAL      LONG TERM GOALS: Target date: 05/31/2023   Patient will report at least 75% improvement in overall symptoms and/or function to demonstrate improved functional mobility Baseline: 0% better Goal status: INITIAL  2.  Patient will be able to ambulate at least 300 feet in 2 minutes without cane to demonstrate improved functional endurance and gait speed Baseline: see above Goal status: INITIAL  3.  Patient will be able to confidently walk in the community without requiring husband's assistance to demonstrate improved safety in community. Baseline: Unable/requires husband's assistance Goal status: INITIAL     PLAN:  PT FREQUENCY: 1-2x/week for a total of 12 visits over 8 week certification period  PT DURATION: 8 weeks  PLANNED INTERVENTIONS: Therapeutic exercises, Therapeutic activity, Neuromuscular re-education, Balance training, Gait training, Patient/Family education, Self Care, Joint mobilization, Joint manipulation, Stair training, Vestibular training, Canalith repositioning, Orthotic/Fit training, DME instructions, Aquatic Therapy, Dry Needling, Electrical stimulation, Spinal manipulation, Spinal mobilization, Cryotherapy, Moist heat, Vasopneumatic device,  Traction, Ultrasound, Ionotophoresis 4mg /ml Dexamethasone, Manual therapy, and Re-evaluation  PLAN FOR NEXT SESSION: assess MMT,  assess SLS/TANDEM,  go over available classes at community to see if any are appropriate, walking mechanics with and without the cane, balance and LE strengthening.    4:12 PM, 04/05/23 Tereasa Coop, DPT Physical Therapy with [facility]

## 2023-04-09 DIAGNOSIS — E871 Hypo-osmolality and hyponatremia: Secondary | ICD-10-CM | POA: Diagnosis not present

## 2023-04-09 DIAGNOSIS — E876 Hypokalemia: Secondary | ICD-10-CM | POA: Diagnosis not present

## 2023-04-14 ENCOUNTER — Encounter: Payer: Self-pay | Admitting: Physical Therapy

## 2023-04-14 ENCOUNTER — Ambulatory Visit (INDEPENDENT_AMBULATORY_CARE_PROVIDER_SITE_OTHER): Payer: PPO | Admitting: Physical Therapy

## 2023-04-14 DIAGNOSIS — R262 Difficulty in walking, not elsewhere classified: Secondary | ICD-10-CM

## 2023-04-14 DIAGNOSIS — R2689 Other abnormalities of gait and mobility: Secondary | ICD-10-CM

## 2023-04-14 DIAGNOSIS — M6281 Muscle weakness (generalized): Secondary | ICD-10-CM

## 2023-04-14 NOTE — Therapy (Signed)
Ms. Betker arrived 15 minutes late today, PT took her back to the gym and explained that session would be limited due to late arrival, offered option of abbreviated session versus holding for next appointment/receiving full services.   Patient chose to reschedule at this time so she could receive full PT session.  Did encourage her to bring list of classes from [facility] to next appt so therapist could help her to select best class for her. Also briefly reviewed gait mechanics with [facility].   Finally, reminded her of date/time of next scheduled session as well as [facility]'s policy for being late to appointments.   Nedra Hai PT DPT PN2

## 2023-04-19 DIAGNOSIS — E876 Hypokalemia: Secondary | ICD-10-CM | POA: Diagnosis not present

## 2023-04-19 DIAGNOSIS — E871 Hypo-osmolality and hyponatremia: Secondary | ICD-10-CM | POA: Diagnosis not present

## 2023-04-20 ENCOUNTER — Ambulatory Visit
Admission: RE | Admit: 2023-04-20 | Discharge: 2023-04-20 | Disposition: A | Discharge status: Home / Self Care | Payer: PPO | Source: Ambulatory Visit | Attending: Internal Medicine | Admitting: Internal Medicine

## 2023-04-20 DIAGNOSIS — E349 Endocrine disorder, unspecified: Secondary | ICD-10-CM | POA: Diagnosis not present

## 2023-04-20 DIAGNOSIS — M810 Age-related osteoporosis without current pathological fracture: Secondary | ICD-10-CM | POA: Diagnosis not present

## 2023-04-20 DIAGNOSIS — E2839 Other primary ovarian failure: Secondary | ICD-10-CM

## 2023-04-20 DIAGNOSIS — N951 Menopausal and female climacteric states: Secondary | ICD-10-CM | POA: Diagnosis not present

## 2023-04-21 ENCOUNTER — Encounter: Payer: Self-pay | Admitting: Physical Therapy

## 2023-04-21 ENCOUNTER — Ambulatory Visit: Payer: PPO | Admitting: Physical Therapy

## 2023-04-21 DIAGNOSIS — R262 Difficulty in walking, not elsewhere classified: Secondary | ICD-10-CM | POA: Diagnosis not present

## 2023-04-21 DIAGNOSIS — R2689 Other abnormalities of gait and mobility: Secondary | ICD-10-CM | POA: Diagnosis not present

## 2023-04-21 DIAGNOSIS — M6281 Muscle weakness (generalized): Secondary | ICD-10-CM | POA: Diagnosis not present

## 2023-04-21 NOTE — Therapy (Signed)
OUTPATIENT PHYSICAL THERAPY LOWER EXTREMITY TREATMENT   Patient Name: Cindy Rhodes MRN: [id number] DOB:12-Apr-1946, 77 y.o., female Today's Date: 04/21/2023  END OF SESSION:  PT End of Session - 04/21/23 1426     Visit Number 2    Number of Visits 12    Date for PT Re-Evaluation 05/31/23    PT Start Time 1428    PT Stop Time 1511    PT Time Calculation (min) 43 min    Activity Tolerance Patient tolerated treatment well    Behavior During Therapy [facility] for tasks assessed/performed              Past Medical History:  Diagnosis Date   Depression    Diabetes mellitus without complication (HCC)    Hypertension    Past Surgical History:  Procedure Laterality Date   ABDOMINAL HYSTERECTOMY     HAND SURGERY Left    Patient Active Problem List   Diagnosis Date Noted   Hypertensive crisis 03/18/2023   Acute encephalopathy 03/18/2023   Hypomagnesemia 03/18/2023   Prolonged QT interval 03/18/2023   Anxiety disorder 05/14/2021   Atrophy of vagina 05/14/2021   Decreased estrogen level 05/14/2021   Type 2 diabetes mellitus with other specified complication (HCC) 05/14/2021   Diabetic renal disease (HCC) 05/14/2021   Interstitial cystitis 05/14/2021   Leukocytosis 05/14/2021   Major depression, single episode, in complete remission (HCC) 05/14/2021   Migraine without aura, not refractory 05/14/2021   Osteopenia 05/14/2021   Pure hypercholesterolemia 05/14/2021   Subacute vulvitis 05/14/2021   Urge incontinence of urine 05/14/2021   Pincer nail deformity 01/24/2020   DDD (degenerative disc disease), cervical 10/13/2019   Spinal stenosis in cervical region 10/13/2019   Neck pain 08/28/2019   Pain in right hand 05/08/2019   Trigger finger of right hand 05/08/2019   Hypokalemia 08/12/2017   Hyponatremia 08/12/2017   Essential hypertension 08/12/2017   Type 2 diabetes mellitus with hyperglycemia (HCC) 08/12/2017   Acute lower UTI 08/12/2017    PCP: Renford Dills,  MD  REFERRING PROVIDER:   Zigmund Daniel., MD    REFERRING DIAG: R53.81 (ICD-10-CM) - Physical deconditioning   THERAPY DIAG:  Muscle weakness (generalized)  Difficulty in walking, not elsewhere classified  Balance problem  Rationale for Evaluation and Treatment: Rehabilitation  ONSET DATE: recent hospitalization 03/25/23  SUBJECTIVE:   SUBJECTIVE STATEMENT: 04/21/2023 States she has her cane but she holds it in her hand. Has walked some and used the bike a little at the gym. States she is still using her husband for walking   EVAL: States she has been having balance issues prior to her recent hospitalization. States she has been feeling weak since her recent hospitalization. States she can still do things but it is a challenging. States she feels off balance and needs to hold on to her husband at time when she walks. States her walking speed is slower. States she feels like she can walk without assistance for a couple minutes .  PERTINENT HISTORY: DB, HTN PAIN:  Are you having pain?no  PRECAUTIONS: Fall  WEIGHT BEARING RESTRICTIONS: No  FALLS:  Has patient fallen in last 6 months? no  LIVING ENVIRONMENT: Lives with: lives with their family and lives with their spouse  Has following equipment at home:  [facility] - could easily get AD if needed.   PLOF: Independent  PATIENT GOALS: wants to be confident with walking   OBJECTIVE:      COGNITION: Overall  cognitive status: Within functional limits for tasks assessed       POSTURE: flexed trunk       LE Measurements Lower Extremity Right 04/21/2023 Left 04/21/2023   A/PROM MMT A/PROM MMT  Hip Flexion  3+  4-  Hip Extension      Hip Abduction (seated)  4  4  Hip Adduction(seated)  4  4  Hip Internal rotation      Hip External rotation      Knee Flexion  4  4  Knee Extension  4  4  Ankle Dorsiflexion  3+  3+  Ankle Plantarflexion      Ankle Inversion      Ankle Eversion        (Blank rows = not tested) * pain    FUNCTIONAL TESTS:   On Eval: STS 18.21 seconds - uncontrolled descent, no UE support 04/21/23 SLS 3 seconds on R and 1 second on left no UE support, TANDEM RIGHT IN BACK 14 SECONDS AND LEFT IN BACK 3 SECONDS    GAIT: on Eval:  Distance walked: 212 ft Assistive device utilized: None Level of assistance: Modified independence Comments: with gait belt, feet apart, wide BOS at rest and slight cross over of right leg (leads with groin muscles. Practiced with cane too but difficult to coordinate   TODAY'S TREATMENT:                                                                                                                              DATE:   04/21/2023  Therapeutic Exercise:  Standing:heel raises with B UE assist 3x5 DL Supine: Prone:  Seated: STS x5 Neuromuscular Re-education: Standing:SLS with 2 finger support - max attempts x10 B, tandem x5 B max attempts, marching with one hand support x20 B  Manual Therapy: Therapeutic Activity: Self Care: Trigger Point Dry Needling:  Modalities:    PATIENT EDUCATION:  Education details: on current presentation, on continued importance of use of cane, classes to try/take  Person educated: Patient Education method: Explanation, Demonstration, and Handouts Education comprehension: verbalized understanding   HOME EXERCISE PROGRAM: [id number]  ASSESSMENT:  CLINICAL IMPRESSION: 04/21/2023 Session focused on reiteration on importance of using cane at all times when walking around.  Reviewed classes available at community center and discussed classes that she should try including a class tomorrow at 10 AM.  Will follow-up with this next session to make sure she tried classes.  Assessed balance today and discussed importance of being able to balance prior to going off of cane.  Added new exercises to home exercise program and will continue with current plan of care as tolerated.  Eval: Patient  presents to therapy with complaints of weakness and balance deficits that have been going on for a while but has been worse since recent hospitalization.  Session limited secondary to education and stressing importance of safety at home and in the community.  Patient presents with functional strength  deficits as well as reduced 2-minute walk and overall gait speed.  Educated patient on findings as well as plan of care moving forward.  Patient would greatly benefit from skilled physical therapy to improve overall function and reduce fall risk at this time.  OBJECTIVE IMPAIRMENTS: Abnormal gait, decreased activity tolerance, decreased balance, decreased coordination, decreased endurance, decreased knowledge of use of DME, difficulty walking, decreased ROM, decreased strength, and pain.   ACTIVITY LIMITATIONS: bending, standing, squatting, transfers, and locomotion level  PARTICIPATION LIMITATIONS: community activity  PERSONAL FACTORS: Fitness are also affecting patient's functional outcome.   REHAB POTENTIAL: Good  CLINICAL DECISION MAKING: Stable/uncomplicated  EVALUATION COMPLEXITY: Low   GOALS: Goals reviewed with patient? yes  SHORT TERM GOALS: Target date: 05/19/2023  Patient will be independent in self management strategies to improve quality of life and functional outcomes. Baseline: New Program Goal status: INITIAL  2.  Patient will report at least 50% improvement in overall symptoms and/or function to demonstrate improved functional mobility Baseline: 0% better Goal status: INITIAL  3.  Patient will be able to demonstrate 5 times sit to stand in less than 13 seconds to demonstrate improved functional strength in lower extremities Baseline: See above Goal status: INITIAL      LONG TERM GOALS: Target date: 06/16/2023   Patient will report at least 75% improvement in overall symptoms and/or function to demonstrate improved functional mobility Baseline: 0% better Goal  status: INITIAL  2.  Patient will be able to ambulate at least 300 feet in 2 minutes without cane to demonstrate improved functional endurance and gait speed Baseline: see above Goal status: INITIAL  3.  Patient will be able to confidently walk in the community without requiring husband's assistance to demonstrate improved safety in community. Baseline: Unable/requires husband's assistance Goal status: INITIAL     PLAN:  PT FREQUENCY: 1-2x/week for a total of 12 visits over 8 week certification period  PT DURATION: 8 weeks  PLANNED INTERVENTIONS: Therapeutic exercises, Therapeutic activity, Neuromuscular re-education, Balance training, Gait training, Patient/Family education, Self Care, Joint mobilization, Joint manipulation, Stair training, Vestibular training, Canalith repositioning, Orthotic/Fit training, DME instructions, Aquatic Therapy, Dry Needling, Electrical stimulation, Spinal manipulation, Spinal mobilization, Cryotherapy, Moist heat, Vasopneumatic device, Traction, Ultrasound, Ionotophoresis 4mg /ml Dexamethasone, Manual therapy, and Re-evaluation  PLAN FOR NEXT SESSION:f/u with patient attending chair yoga on Thursday, make sure she is using cane, balance and MMT      3:17 PM, 04/21/23 Tereasa Coop, DPT Physical Therapy with [facility]

## 2023-04-22 ENCOUNTER — Ambulatory Visit: Payer: PPO | Admitting: Physician Assistant

## 2023-04-22 ENCOUNTER — Encounter: Payer: Self-pay | Admitting: Physician Assistant

## 2023-04-22 ENCOUNTER — Other Ambulatory Visit (INDEPENDENT_AMBULATORY_CARE_PROVIDER_SITE_OTHER): Payer: PPO

## 2023-04-22 ENCOUNTER — Ambulatory Visit: Payer: PPO

## 2023-04-22 VITALS — BP 176/79 | HR 60 | Resp 20 | Ht 61.0 in | Wt 132.0 lb

## 2023-04-22 DIAGNOSIS — R413 Other amnesia: Secondary | ICD-10-CM

## 2023-04-22 MED ORDER — MEMANTINE HCL 5 MG PO TABS: ORAL_TABLET | ORAL | 3 refills | Status: DC

## 2023-04-22 NOTE — Patient Instructions (Addendum)
It was a pleasure to see you today at our office.   Recommendations:  Neurocognitive evaluation at our office   Check labs today  Follow up in 3 months Start Memantine 5mg  tablets.  Take 1 tablet at bedtime for 2 weeks, then 1 tablet twice daily.        For assessment of decision of mental capacity and competency:  Call Dr. Erick Blinks, geriatric psychiatrist at [phone] Counseling regarding caregiver distress, including caregiver depression, anxiety and issues regarding community resources, adult day care programs, adult living facilities, or memory care questions:  please contact your  Primary Doctor's Social Worker  Whom to call: Memory  decline, memory medications: Call our office [phone]  For psychiatric meds, mood meds: Please have your primary care physician manage these medications.  If you have any severe symptoms of a stroke, or other severe issues such as confusion,severe chills or fever, etc call 911 or go to the ER as you may need to be evaluated further    RECOMMENDATIONS FOR ALL PATIENTS WITH MEMORY PROBLEMS: 1. Continue to exercise (Recommend 30 minutes of walking everyday, or 3 hours every week) 2. Increase social interactions - continue going to [place] and enjoy social gatherings with friends and family 3. Eat healthy, avoid fried foods and eat more fruits and vegetables 4. Maintain adequate blood pressure, blood sugar, and blood cholesterol level. Reducing the risk of stroke and cardiovascular disease also helps promoting better memory. 5. Avoid stressful situations. Live a simple life and avoid aggravations. Organize your time and prepare for the next day in anticipation. 6. Sleep well, avoid any interruptions of sleep and avoid any distractions in the bedroom that may interfere with adequate sleep quality 7. Avoid sugar, avoid sweets as there is a strong link between excessive sugar intake, diabetes, and cognitive impairment We discussed the Mediterranean  diet, which has been shown to help patients reduce the risk of progressive memory disorders and reduces cardiovascular risk. This includes eating fish, eat fruits and green leafy vegetables, nuts like almonds and hazelnuts, walnuts, and also use olive oil. Avoid fast foods and fried foods as much as possible. Avoid sweets and sugar as sugar use has been linked to worsening of memory function.  There is always a concern of gradual progression of memory problems. If this is the case, then we may need to adjust level of care according to patient needs. Support, both to the patient and caregiver, should then be put into place.      You have been referred for a neuropsychological evaluation (i.e., evaluation of memory and thinking abilities). Please bring someone with you to this appointment if possible, as it is helpful for the doctor to hear from both you and another adult who knows you well. Please bring eyeglasses and hearing aids if you wear them.    The evaluation will take approximately 3 hours and has two parts:   The first part is a clinical interview with the neuropsychologist (Dr. Milbert Coulter or Dr. Roseanne Reno). During the interview, the neuropsychologist will speak with you and the individual you brought to the appointment.    The second part of the evaluation is testing with the doctor's technician Annabelle Harman or Selena Batten). During the testing, the technician will ask you to remember different types of material, solve problems, and answer some questionnaires. Your family member will not be present for this portion of the evaluation.   Please note: We must reserve several hours of the neuropsychologist's time and the psychometrician's  time for your evaluation appointment. As such, there is a No-Show fee of $100. If you are unable to attend any of your appointments, please contact our office as soon as possible to reschedule.    FALL PRECAUTIONS: Be cautious when walking. Scan the area for obstacles that may  increase the risk of trips and falls. When getting up in the mornings, sit up at the edge of the bed for a few minutes before getting out of bed. Consider elevating the bed at the head end to avoid drop of blood pressure when getting up. Walk always in a well-lit room (use night lights in the walls). Avoid area rugs or power cords from appliances in the middle of the walkways. Use a walker or a cane if necessary and consider physical therapy for balance exercise. Get your eyesight checked regularly.  FINANCIAL OVERSIGHT: Supervision, especially oversight when making financial decisions or transactions is also recommended.  HOME SAFETY: Consider the safety of the kitchen when operating appliances like stoves, microwave oven, and blender. Consider having supervision and share cooking responsibilities until no longer able to participate in those. Accidents with firearms and other hazards in the house should be identified and addressed as well.   ABILITY TO BE LEFT ALONE: If patient is unable to contact 911 operator, consider using LifeLine, or when the need is there, arrange for someone to stay with patients. Smoking is a fire hazard, consider supervision or cessation. Risk of wandering should be assessed by caregiver and if detected at any point, supervision and safe proof recommendations should be instituted.  MEDICATION SUPERVISION: Inability to self-administer medication needs to be constantly addressed. Implement a mechanism to ensure safe administration of the medications.   DRIVING: Regarding driving, in patients with progressive memory problems, driving will be impaired. We advise to have someone else do the driving if trouble finding directions or if minor accidents are reported. Independent driving assessment is available to determine safety of driving.      Mediterranean Diet A Mediterranean diet refers to food and lifestyle choices that are based on the traditions of countries located on  the [employer]. This way of eating has been shown to help prevent certain conditions and improve outcomes for people who have chronic diseases, like kidney disease and heart disease. What are tips for following this plan? Lifestyle  Cook and eat meals together with your family, when possible. Drink enough fluid to keep your urine clear or pale yellow. Be physically active every day. This includes: Aerobic exercise like running or swimming. Leisure activities like gardening, walking, or housework. Get 7-8 hours of sleep each night. If recommended by your health care provider, drink red wine in moderation. This means 1 glass a day for nonpregnant women and 2 glasses a day for men. A glass of wine equals 5 oz (150 mL). Reading food labels  Check the serving size of packaged foods. For foods such as rice and pasta, the serving size refers to the amount of cooked product, not dry. Check the total fat in packaged foods. Avoid foods that have saturated fat or trans fats. Check the ingredients list for added sugars, such as corn syrup. Shopping  At the grocery store, buy most of your food from the areas near the walls of the store. This includes: Fresh fruits and vegetables (produce). Grains, beans, nuts, and seeds. Some of these may be available in unpackaged forms or large amounts (in bulk). Fresh seafood. Poultry and eggs. Low-fat dairy products. Buy whole  ingredients instead of prepackaged foods. Buy fresh fruits and vegetables in-season from local farmers markets. Buy frozen fruits and vegetables in resealable bags. If you do not have access to quality fresh seafood, buy precooked frozen shrimp or canned fish, such as tuna, salmon, or sardines. Buy small amounts of raw or cooked vegetables, salads, or olives from the deli or salad bar at your store. Stock your pantry so you always have certain foods on hand, such as olive oil, canned tuna, canned tomatoes, rice, pasta, and  beans. Cooking  Cook foods with extra-virgin olive oil instead of using butter or other vegetable oils. Have meat as a side dish, and have vegetables or grains as your main dish. This means having meat in small portions or adding small amounts of meat to foods like pasta or stew. Use beans or vegetables instead of meat in common dishes like chili or lasagna. Experiment with different cooking methods. Try roasting or broiling vegetables instead of steaming or sauteing them. Add frozen vegetables to soups, stews, pasta, or rice. Add nuts or seeds for added healthy fat at each meal. You can add these to yogurt, salads, or vegetable dishes. Marinate fish or vegetables using olive oil, lemon juice, garlic, and fresh herbs. Meal planning  Plan to eat 1 vegetarian meal one day each week. Try to work up to 2 vegetarian meals, if possible. Eat seafood 2 or more times a week. Have healthy snacks readily available, such as: Vegetable sticks with hummus. Greek yogurt. Fruit and nut trail mix. Eat balanced meals throughout the week. This includes: Fruit: 2-3 servings a day Vegetables: 4-5 servings a day Low-fat dairy: 2 servings a day Fish, poultry, or lean meat: 1 serving a day Beans and legumes: 2 or more servings a week Nuts and seeds: 1-2 servings a day Whole grains: 6-8 servings a day Extra-virgin olive oil: 3-4 servings a day Limit red meat and sweets to only a few servings a month What are my food choices? Mediterranean diet Recommended Grains: Whole-grain pasta. Brown rice. Bulgar wheat. Polenta. Couscous. Whole-wheat bread. Orpah Cobb. Vegetables: Artichokes. Beets. Broccoli. Cabbage. Carrots. Eggplant. Green beans. Chard. Kale. Spinach. Onions. Leeks. Peas. Squash. Tomatoes. Peppers. Radishes. Fruits: Apples. Apricots. Avocado. Berries. Bananas. Cherries. Dates. Figs. Grapes. Lemons. Melon. Oranges. Peaches. Plums. Pomegranate. Meats and other protein foods: Beans. Almonds.  Sunflower seeds. Pine nuts. Peanuts. Cod. Salmon. Scallops. Shrimp. Tuna. Tilapia. Clams. Oysters. Eggs. Dairy: Low-fat milk. Cheese. Greek yogurt. Beverages: Water. Red wine. Herbal tea. Fats and oils: Extra virgin olive oil. Avocado oil. Grape seed oil. Sweets and desserts: [place] yogurt with honey. Baked apples. Poached pears. Trail mix. Seasoning and other foods: Basil. Cilantro. Coriander. Cumin. Mint. Parsley. Sage. Rosemary. Tarragon. Garlic. Oregano. Thyme. Pepper. Balsalmic vinegar. Tahini. Hummus. Tomato sauce. Olives. Mushrooms. Limit these Grains: Prepackaged pasta or rice dishes. Prepackaged cereal with added sugar. Vegetables: Deep fried potatoes (french fries). Fruits: Fruit canned in syrup. Meats and other protein foods: Beef. Pork. Lamb. Poultry with skin. Hot dogs. Tomasa Blase. Dairy: Ice cream. Sour cream. Whole milk. Beverages: Juice. Sugar-sweetened soft drinks. Beer. Liquor and spirits. Fats and oils: Butter. Canola oil. Vegetable oil. Beef fat (tallow). Lard. Sweets and desserts: Cookies. Cakes. Pies. Candy. Seasoning and other foods: Mayonnaise. Premade sauces and marinades. The items listed may not be a complete list. Talk with your dietitian about what dietary choices are right for you. Summary The Mediterranean diet includes both food and lifestyle choices. Eat a variety of fresh fruits and vegetables, beans, nuts, seeds,  and whole grains. Limit the amount of red meat and sweets that you eat. Talk with your health care provider about whether it is safe for you to drink red wine in moderation. This means 1 glass a day for nonpregnant women and 2 glasses a day for men. A glass of wine equals 5 oz (150 mL). This information is not intended to replace advice given to you by your health care provider. Make sure you discuss any questions you have with your health care provider. Document Released: 07/09/2016 Document Revised: 09/12/  Labs today [address]

## 2023-04-22 NOTE — Progress Notes (Signed)
Assessment/Plan:   Cindy Rhodes is a very pleasant 77 y.o. year old LH female with a history of hypertension, hyperlipidemia, DM2 with retinopathy, anxiety, depression, recent hospitalization in April 2024 for hyponatremia due to excessive water intake, history of prolonged QT, seen today for evaluation of memory loss. Personally reviewed MRI brain from 03/2023 is remarkable for moderate to severe ischemic small vessel disease,as well as cerebral atrophy. No acute findings.  MoCA today is 21/30. Patient is able to participate on his IADLs and to drive without any difficulties.     Memory Impairment of unclear etiology   Neurocognitive testing to further evaluate cognitive concerns and determine other underlying cause of memory changes, including potential contribution from sleep, anxiety, vascular disease  Check B12, TSH  Start Memantine 5 mg: Take 1 tablet (5 mg at night) for 2 weeks, then increase to 1 tablet (5 mg) twice a day , side effects discussed. (QT prolongation on most recent EKG)  Continue to control mood as per PCP Continue PT for strength and balance ( recent hospitalization)  Recommend good control of cardiovascular risk factors [place] up in 3 months  Subjective:   The patient is accompanied by her husband  who supplements the history.   How long did patient have memory difficulties? Husband reports that has noticed some cognitive changes over the past year.  She did not notice any changes other than " some aging". Patient has some difficulty remembering recent conversations and people names. "Nothing more than the average person at my age" . She may have some difficulty finding the correct word.  "I always look for the right word"-she says . Likes to do puzzles, word search and other brain stimulating exercises.  repeats oneself?  Endorsed. "She will ask the same question"-husband says Disoriented when walking into a room?  Patient denies   Leaving objects in unusual  places? denies   Wandering behavior?  denies   Any personality changes?  Patient denies   Any history of depression?:  Patient denies   Hallucinations or paranoia?  Patient denies   Seizures?   Patient denies    Any sleep changes?  "My sleep is not as sound as it used to be". Reports vivid dreams, REM behavior or sleepwalking   Sleep apnea?  Patient denies   Any hygiene concerns?  Patient denies   Independent of bathing and dressing?  Endorsed  Does the patient needs help with medications?  Patient is in charge   Who is in charge of the finances? Patient and husband in charge     Any changes in appetite?  denies     Patient have trouble swallowing? denies   Does the patient cook?  "I  don't really cook, I am the cleaner".    Any kitchen accidents such as leaving the stove on? denies   Any headaches?  Only when she was having periods "stopped around age 20" Chronic back pain ? denies   Ambulates with difficulty?  'Balance issues" doing PT, for this last week. She likes to exercise at the [facility] [facility]  Recent falls or head injuries? Not recently. 3 years ago she fell and had a concussion.  Vision changes?" Never had very good depth perception"  Unilateral weakness, numbness or tingling? denies   Any tremors?   denies   Any anosmia?  denies   Any incontinence of urine?  She has a history of urge incontinence Any bowel dysfunction?  Occasionally she has lose stools.  Patient lives  [facility]  History of heavy alcohol intake? denies   History of heavy tobacco use? denies   Family history of dementia? denies  Does patient drive? Yes, no issues  Graduated from [employer] Retired from [employer] at [employer]   Past Medical History:  Diagnosis Date   Depression    Diabetes mellitus without complication (HCC)    Hypertension      Past Surgical History:  Procedure Laterality Date   ABDOMINAL HYSTERECTOMY     HAND SURGERY Left      Allergies  Allergen Reactions    Hydrochlorothiazide Other (See Comments)    Unknown    Meloxicam Other (See Comments)    Dizzy and nervous    Pork-Derived Products Other (See Comments)    Religious    Sulfa Antibiotics Other (See Comments)    Shuts down urinary system     Current Outpatient Medications  Medication Instructions   amLODipine (NORVASC) 5 mg, Oral, Daily   Calcium Carbonate-Vitamin D 600-400 MG-UNIT tablet 1 tablet, Oral, Daily   Coenzyme Q10 (CO Q 10 PO) 1 capsule, Oral, Daily   Crestor 2.5 mg, Oral, Daily at bedtime   famotidine (PEPCID) 20 mg, Oral, Daily   glucosamine-chondroitin 500-400 MG tablet 3 tablets, Oral, Daily   memantine (NAMENDA) 5 MG tablet Take 1 tablet (5 mg at night) for 2 weeks, then increase to 1 tablet (5 mg) twice a day   metFORMIN (GLUCOPHAGE-XR) 1,000 mg, Oral, 2 times daily   metoprolol tartrate (LOPRESSOR) 25 mg, Oral, 2 times daily   Misc Natural Products (TART CHERRY ADVANCED) CAPS 1 capsule, Oral, Daily   Multiple Vitamin (MULTIVITAMIN WITH MINERALS) TABS tablet 1 tablet, Oral, Daily   oxybutynin (DITROPAN-XL) 10 mg, Oral, Daily   sertraline (ZOLOFT) 75 mg, Daily   spironolactone (ALDACTONE) 12.5 mg, Oral, Daily   telmisartan (MICARDIS) 80 mg, Oral, Daily   Vitamin C 500 mg, Oral, Daily     VITALS:   Vitals:   04/22/23 1356  BP: (!) 176/79  Pulse: 60  Resp: 20  SpO2: 97%  Weight: 132 lb (59.9 kg)  Height: 5\' 1"  (1.549 m)       No data to display          PHYSICAL EXAM   HEENT:  Normocephalic, atraumatic. The mucous membranes are moist. The superficial temporal arteries are without ropiness or tenderness. Cardiovascular: Regular rate and rhythm. Lungs: Clear to auscultation bilaterally. Neck: There are no carotid bruits noted bilaterally.  NEUROLOGICAL:    04/22/2023    2:00 PM  Montreal Cognitive Assessment   Visuospatial/ Executive (0/5) 3  Naming (0/3) 3  Attention: Read list of digits (0/2) 0  Attention: Read list of letters (0/1) 1   Attention: Serial 7 subtraction starting at 100 (0/3) 3  Language: Repeat phrase (0/2) 2  Language : Fluency (0/1) 0  Abstraction (0/2) 2  Delayed Recall (0/5) 1  Orientation (0/6) 6  Total 21  Adjusted Score (based on education) 21        No data to display           Orientation:  Alert and oriented to person, place and time. No aphasia or dysarthria. Fund of knowledge is appropriate. Recent memory impaired and remote memory intact.  Attention and concentration are normal.  Able to name objects and repeat phrases. Delayed recall 1/5 Cranial nerves: There is good facial symmetry. Extraocular muscles are intact and visual fields are full to confrontational testing. Speech is  fluent and clear. No tongue deviation. Hearing is intact to conversational tone. Tone: Tone is good throughout. Sensation: Sensation is intact to light touch and pinprick throughout. Vibration is intact at the bilateral big toe.  Coordination: The patient has no difficulty with RAM's or FNF bilaterally. Normal finger to nose  Motor: Strength is 5/5 in the bilateral upper and lower extremities. There is no pronator drift. There are no fasciculations noted. DTR's: Deep tendon reflexes are 2/4 at the bilateral biceps, triceps, brachioradialis, patella and achilles.  Plantar responses are downgoing bilaterally. Gait and Station: The patient is able to ambulate without difficulty.The patient is able to heel toe walk without any difficulty.The patient is able to ambulate in a tandem fashion. The patient is able to stand in the Romberg position.     Thank you for allowing [place] the opportunity to participate in the care of this nice patient. Please do not hesitate to contact us for any questions or concerns.   Total time spent on today's visit was 62 minutes dedicated to this patient today, preparing to see patient, examining the patient, ordering tests and/or medications and counseling the patient, documenting clinical  information in the EHR or other health record, independently interpreting results and communicating results to the patient/family, discussing treatment and goals, answering patient's questions and coordinating care.  Cc:  Renford Dills, MD  Marlowe Kays 04/22/2023 3:55 PM

## 2023-04-23 ENCOUNTER — Ambulatory Visit: Payer: PPO | Admitting: Physical Therapy

## 2023-04-23 ENCOUNTER — Encounter: Payer: Self-pay | Admitting: Physical Therapy

## 2023-04-23 DIAGNOSIS — R262 Difficulty in walking, not elsewhere classified: Secondary | ICD-10-CM

## 2023-04-23 DIAGNOSIS — M6281 Muscle weakness (generalized): Secondary | ICD-10-CM | POA: Diagnosis not present

## 2023-04-23 NOTE — Therapy (Addendum)
 OUTPATIENT PHYSICAL THERAPY LOWER EXTREMITY TREATMENT   Patient Name: Cindy Rhodes MRN: [id number] DOB:Nov 13, 1946, 77 y.o., female Today's Date: 04/23/2023  END OF SESSION:  PT End of Session - 04/23/23 1235     Visit Number 3    Number of Visits 12    Date for PT Re-Evaluation 05/31/23    PT Start Time 1235    PT Stop Time 1315    PT Time Calculation (min) 40 min    Activity Tolerance Patient tolerated treatment well    Behavior During Therapy [facility] for tasks assessed/performed              Past Medical History:  Diagnosis Date   Depression    Diabetes mellitus without complication (HCC)    Hypertension    Past Surgical History:  Procedure Laterality Date   ABDOMINAL HYSTERECTOMY     HAND SURGERY Left    Patient Active Problem List   Diagnosis Date Noted   Hypertensive crisis 03/18/2023   Acute encephalopathy 03/18/2023   Hypomagnesemia 03/18/2023   Prolonged QT interval 03/18/2023   Anxiety disorder 05/14/2021   Atrophy of vagina 05/14/2021   Decreased estrogen level 05/14/2021   Type 2 diabetes mellitus with other specified complication (HCC) 05/14/2021   Diabetic renal disease (HCC) 05/14/2021   Interstitial cystitis 05/14/2021   Leukocytosis 05/14/2021   Major depression, single episode, in complete remission (HCC) 05/14/2021   Migraine without aura, not refractory 05/14/2021   Osteopenia 05/14/2021   Pure hypercholesterolemia 05/14/2021   Subacute vulvitis 05/14/2021   Urge incontinence of urine 05/14/2021   Pincer nail deformity 01/24/2020   DDD (degenerative disc disease), cervical 10/13/2019   Spinal stenosis in cervical region 10/13/2019   Neck pain 08/28/2019   Pain in right hand 05/08/2019   Trigger finger of right hand 05/08/2019   Hypokalemia 08/12/2017   Hyponatremia 08/12/2017   Essential hypertension 08/12/2017   Type 2 diabetes mellitus with hyperglycemia (HCC) 08/12/2017   Acute lower UTI 08/12/2017    PCP: Renford Dills,  MD  REFERRING PROVIDER:   Zigmund Daniel., MD    REFERRING DIAG: R53.81 (ICD-10-CM) - Physical deconditioning   THERAPY DIAG:  Muscle weakness (generalized)  Difficulty in walking, not elsewhere classified  Rationale for Evaluation and Treatment: Rehabilitation  ONSET DATE: recent hospitalization 03/25/23  SUBJECTIVE:   SUBJECTIVE STATEMENT: 04/23/2023 No new complaints. Has been using the cane "some" . Did not attend exercise class, but plans to next week.   EVAL: States she has been having balance issues prior to her recent hospitalization. States she has been feeling weak since her recent hospitalization. States she can still do things but it is a challenging. States she feels off balance and needs to hold on to her husband at time when she walks. States her walking speed is slower. States she feels like she can walk without assistance for a couple minutes .  PERTINENT HISTORY: DB, HTN PAIN:  Are you having pain?no  PRECAUTIONS: Fall  WEIGHT BEARING RESTRICTIONS: No  FALLS:  Has patient fallen in last 6 months? no  LIVING ENVIRONMENT: Lives with: lives with their family and lives with their spouse  Has following equipment at home:  [facility] - could easily get AD if needed.   PLOF: Independent  PATIENT GOALS: wants to be confident with walking   OBJECTIVE:   COGNITION: Overall cognitive status: Within functional limits for tasks assessed     POSTURE: flexed trunk  LE Measurements Lower Extremity Right 04/21/2023 Left 04/21/2023   A/PROM MMT A/PROM MMT  Hip Flexion  3+  4-  Hip Extension      Hip Abduction (seated)  4  4  Hip Adduction(seated)  4  4  Hip Internal rotation      Hip External rotation      Knee Flexion  4  4  Knee Extension  4  4  Ankle Dorsiflexion  3+  3+  Ankle Plantarflexion      Ankle Inversion      Ankle Eversion       (Blank rows = not tested) * pain    FUNCTIONAL TESTS:   On Eval: STS  18.21 seconds - uncontrolled descent, no UE support 04/21/23 SLS 3 seconds on R and 1 second on left no UE support, TANDEM RIGHT IN BACK 14 SECONDS AND LEFT IN BACK 3 SECONDS    GAIT: on Eval:  Distance walked: 212 ft Assistive device utilized: None Level of assistance: Modified independence Comments: with gait belt, feet apart, wide BOS at rest and slight cross over of right leg (leads with groin muscles. Practiced with cane too but difficult to coordinate   TODAY'S TREATMENT:                                                                                                                              DATE:   04/23/2023  Therapeutic Exercise:  Standing: heel raises with 1 hand on counter 2 x 10;   Supine: Prone:  Seated: STS 3 x 5 at mat table , no UE support , with education on mechanics (tendency for rounded back)  Neuromuscular Re-education: Standing:SLS with 2 finger support - max attempts x10 B, tandem x5 B max attempts bil;  marching with one hand support x20 B;  Toe taps on 6 in step 2 x 20;  L/R and A/P weight shifts at mat table x25 ea;  Ambulation with SPC with cuing for shorter stride length and keeping cane moving with L foot.  45 ft x 10 Manual Therapy: Therapeutic Activity: Self Care: Trigger Point Dry Needling:  Modalities:    PATIENT EDUCATION:  Education details: on current presentation, on continued importance of use of cane, classes to try/take  Person educated: Patient Education method: Explanation, Demonstration, and Handouts Education comprehension: verbalized understanding   HOME EXERCISE PROGRAM: [id number]  ASSESSMENT:  CLINICAL IMPRESSION: 04/23/2023 Pt with good tolerance for activities today. She is not completely comfortable using cane yet, but encouraged continued use for safety. She has decreased confidence with standing balance and has tendency to be looking at feet, cued for more upright posture and looking straight ahead with gait today. Pt  to benefit from progressive balance and strength.   Eval: Patient presents to therapy with complaints of weakness and balance deficits that have been going on for a while but has been worse since recent hospitalization.  Session limited secondary to education  and stressing importance of safety at home and in the community.  Patient presents with functional strength deficits as well as reduced 2-minute walk and overall gait speed.  Educated patient on findings as well as plan of care moving forward.  Patient would greatly benefit from skilled physical therapy to improve overall function and reduce fall risk at this time.  OBJECTIVE IMPAIRMENTS: Abnormal gait, decreased activity tolerance, decreased balance, decreased coordination, decreased endurance, decreased knowledge of use of DME, difficulty walking, decreased ROM, decreased strength, and pain.   ACTIVITY LIMITATIONS: bending, standing, squatting, transfers, and locomotion level  PARTICIPATION LIMITATIONS: community activity  PERSONAL FACTORS: Fitness are also affecting patient's functional outcome.   REHAB POTENTIAL: Good  CLINICAL DECISION MAKING: Stable/uncomplicated  EVALUATION COMPLEXITY: Low   GOALS: Goals reviewed with patient? yes  SHORT TERM GOALS: Target date: 05/19/2023  Patient will be independent in self management strategies to improve quality of life and functional outcomes. Baseline: New Program Goal status: INITIAL  2.  Patient will report at least 50% improvement in overall symptoms and/or function to demonstrate improved functional mobility Baseline: 0% better Goal status: INITIAL  3.  Patient will be able to demonstrate 5 times sit to stand in less than 13 seconds to demonstrate improved functional strength in lower extremities Baseline: See above Goal status: INITIAL      LONG TERM GOALS: Target date: 06/16/2023   Patient will report at least 75% improvement in overall symptoms and/or function to  demonstrate improved functional mobility Baseline: 0% better Goal status: INITIAL  2.  Patient will be able to ambulate at least 300 feet in 2 minutes without cane to demonstrate improved functional endurance and gait speed Baseline: see above Goal status: INITIAL  3.  Patient will be able to confidently walk in the community without requiring husband's assistance to demonstrate improved safety in community. Baseline: Unable/requires husband's assistance Goal status: INITIAL     PLAN:  PT FREQUENCY: 1-2x/week for a total of 12 visits over 8 week certification period  PT DURATION: 8 weeks  PLANNED INTERVENTIONS: Therapeutic exercises, Therapeutic activity, Neuromuscular re-education, Balance training, Gait training, Patient/Family education, Self Care, Joint mobilization, Joint manipulation, Stair training, Vestibular training, Canalith repositioning, Orthotic/Fit training, DME instructions, Aquatic Therapy, Dry Needling, Electrical stimulation, Spinal manipulation, Spinal mobilization, Cryotherapy, Moist heat, Vasopneumatic device, Traction, Ultrasound, Ionotophoresis 4mg /ml Dexamethasone, Manual therapy, and Re-evaluation  PLAN FOR NEXT SESSION:f/u with patient attending chair yoga on Thursday, make sure she is using cane, balance and MMT     Sedalia Muta, PT, DPT 1:19 PM  04/23/23   PHYSICAL THERAPY DISCHARGE SUMMARY  Visits from Start of Care: 3    Plan: Patient agrees to discharge.  Patient goals were not met. Patient is being discharged due to - not returning since last visit.   Sedalia Muta, PT, DPT 2:14 PM  01/20/24

## 2023-04-27 LAB — VITAMIN B12: Vitamin B-12: 210 pg/mL — ABNORMAL LOW (ref 211–911)

## 2023-04-27 LAB — TSH: TSH: 1.74 u[IU]/mL (ref 0.35–5.50)

## 2023-04-27 NOTE — Progress Notes (Signed)
Left message to call office at 10:21 04/27/2023

## 2023-04-27 NOTE — Progress Notes (Signed)
B12 is low, please start 1000 mcg of B12 daily, follow with primary doctor. Thyroid levels are normal.  thanks

## 2023-04-28 NOTE — Progress Notes (Signed)
I left detailed message on machine, multiple messages left.

## 2023-05-05 ENCOUNTER — Encounter: Payer: PPO | Admitting: Physical Therapy

## 2023-05-10 DIAGNOSIS — G3184 Mild cognitive impairment, so stated: Secondary | ICD-10-CM | POA: Diagnosis not present

## 2023-05-10 DIAGNOSIS — E7800 Pure hypercholesterolemia, unspecified: Secondary | ICD-10-CM | POA: Diagnosis not present

## 2023-05-10 DIAGNOSIS — E1121 Type 2 diabetes mellitus with diabetic nephropathy: Secondary | ICD-10-CM | POA: Diagnosis not present

## 2023-05-10 DIAGNOSIS — N390 Urinary tract infection, site not specified: Secondary | ICD-10-CM | POA: Diagnosis not present

## 2023-05-10 DIAGNOSIS — Z9181 History of falling: Secondary | ICD-10-CM | POA: Diagnosis not present

## 2023-05-10 DIAGNOSIS — F325 Major depressive disorder, single episode, in full remission: Secondary | ICD-10-CM | POA: Diagnosis not present

## 2023-05-10 DIAGNOSIS — M8588 Other specified disorders of bone density and structure, other site: Secondary | ICD-10-CM | POA: Diagnosis not present

## 2023-05-10 DIAGNOSIS — I10 Essential (primary) hypertension: Secondary | ICD-10-CM | POA: Diagnosis not present

## 2023-05-10 DIAGNOSIS — J069 Acute upper respiratory infection, unspecified: Secondary | ICD-10-CM | POA: Diagnosis not present

## 2023-05-10 DIAGNOSIS — E871 Hypo-osmolality and hyponatremia: Secondary | ICD-10-CM | POA: Diagnosis not present

## 2023-05-13 ENCOUNTER — Encounter: Payer: PPO | Admitting: Physical Therapy

## 2023-05-13 DIAGNOSIS — E871 Hypo-osmolality and hyponatremia: Secondary | ICD-10-CM | POA: Diagnosis not present

## 2023-05-18 ENCOUNTER — Encounter: Payer: Self-pay | Admitting: Podiatry

## 2023-05-18 ENCOUNTER — Ambulatory Visit (INDEPENDENT_AMBULATORY_CARE_PROVIDER_SITE_OTHER): Payer: PPO | Admitting: Podiatry

## 2023-05-18 DIAGNOSIS — E119 Type 2 diabetes mellitus without complications: Secondary | ICD-10-CM | POA: Diagnosis not present

## 2023-05-18 DIAGNOSIS — L608 Other nail disorders: Secondary | ICD-10-CM

## 2023-05-18 DIAGNOSIS — B351 Tinea unguium: Secondary | ICD-10-CM | POA: Diagnosis not present

## 2023-05-18 DIAGNOSIS — M79676 Pain in unspecified toe(s): Secondary | ICD-10-CM

## 2023-05-18 NOTE — Progress Notes (Signed)

## 2023-05-27 ENCOUNTER — Ambulatory Visit: Payer: PPO | Admitting: Psychology

## 2023-05-27 ENCOUNTER — Encounter: Payer: Self-pay | Admitting: Psychology

## 2023-05-27 ENCOUNTER — Ambulatory Visit (INDEPENDENT_AMBULATORY_CARE_PROVIDER_SITE_OTHER): Payer: PPO | Admitting: Psychology

## 2023-05-27 DIAGNOSIS — I999 Unspecified disorder of circulatory system: Secondary | ICD-10-CM | POA: Insufficient documentation

## 2023-05-27 DIAGNOSIS — G9341 Metabolic encephalopathy: Secondary | ICD-10-CM | POA: Diagnosis not present

## 2023-05-27 DIAGNOSIS — I679 Cerebrovascular disease, unspecified: Secondary | ICD-10-CM

## 2023-05-27 DIAGNOSIS — G3184 Mild cognitive impairment, so stated: Secondary | ICD-10-CM | POA: Diagnosis not present

## 2023-05-27 DIAGNOSIS — R4189 Other symptoms and signs involving cognitive functions and awareness: Secondary | ICD-10-CM

## 2023-05-27 HISTORY — DX: Unspecified disorder of circulatory system: I99.9

## 2023-05-27 HISTORY — DX: Mild cognitive impairment of uncertain or unknown etiology: G31.84

## 2023-05-27 NOTE — Progress Notes (Signed)
   Psychometrician Note   Cognitive testing was administered to Jonah Blue by Wallace Keller, B.S. (psychometrist) under the supervision of Dr. Newman Nickels, Ph.D., licensed psychologist on 05/27/2023. Ms. Maxfield did not appear overtly distressed by the testing session per behavioral observation or responses across self-report questionnaires. Rest breaks were offered.    The battery of tests administered was selected by Dr. Newman Nickels, Ph.D. with consideration to Ms. Hiley's current level of functioning, the nature of her symptoms, emotional and behavioral responses during interview, level of literacy, observed level of motivation/effort, and the nature of the referral question. This battery was communicated to the psychometrist. Communication between Dr. Newman Nickels, Ph.D. and the psychometrist was ongoing throughout the evaluation and Dr. Newman Nickels, Ph.D. was immediately accessible at all times. Dr. Newman Nickels, Ph.D. provided supervision to the psychometrist on the date of this service to the extent necessary to assure the quality of all services provided.    Shakerria Parran [facility] will return within approximately 1-2 weeks for an interactive feedback session with Dr. Milbert Coulter at which time her test performances, clinical impressions, and treatment recommendations will be reviewed in detail. Ms. Crownover understands she can contact our office should she require our assistance before this time.  A total of 135 minutes of billable time were spent face-to-face with Ms. Dils by the psychometrist. This includes both test administration and scoring time. Billing for these services is reflected in the clinical report generated by Dr. Newman Nickels, Ph.D.  This note reflects time spent with the psychometrician and does not include test scores or any clinical interpretations made by Dr. Milbert Coulter. The full report will follow in a separate note.

## 2023-05-27 NOTE — Progress Notes (Signed)
NEUROPSYCHOLOGICAL EVALUATION [place]. [facility] Department of Neurology  Date of Evaluation: May 27, 2023  Reason for Referral:   TEKILA WEISMAN is a 77 y.o. right-handed Caucasian female referred by Marlowe Kays, PA-C, to characterize her current cognitive functioning and assist with diagnostic clarity and treatment planning in the context of subjective cognitive decline.   Assessment and Plan:   Clinical Impression(s): Ms. Saravia's pattern of performance is suggestive of ongoing impairment surrounding executive functioning and verbal fluency (both phonemic and semantic). Further performance variability was exhibited across a majority of other assessed domains. This includes processing speed, attention/concentration, visuospatial abilities, and encoding (i.e., learning) aspects of memory. Performances were appropriate relative to age-matched peers across safety/judgment and delayed retrieval aspects of memory. Ms. Okabe denied difficulties completing instrumental activities of daily living (ADLs) independently. Her husband was in agreement. As such, given evidence for cognitive dysfunction described above, she meets criteria for a Mild Neurocognitive Disorder ("mild cognitive impairment") at the present time.  Regarding etiology, Ms. Wanner was admitted to the ED on two separate occasions this past April and diagnosed with acute metabolic encephalopathy, likely secondary to hypertensive crisis and hyponatremia. Cognitive deficits commonly accompany metabolic encephalopathy presentations. These deficits often persist following medical stabilization and exhibit gradual recovery times over the subsequent months. Her pattern across current testing, especially with executive impairment and prominent, diffuse variability across the majority of assessed domains, is certainly a reasonable finding given that her encephalopathic experience was only two months prior to the current  evaluation. Given that prior neuroimaging revealed moderate to severe microvascular ischemic disease, there is a good likelihood that cognitive dysfunction was prior to some degree before her hospitalization. However, this event exacerbating deficits is certainly a reasonable possibility.   Neurologically speaking, her recent brain MRI did also show age-advanced atrophy. Unfortunately, the neuroradiologist who read the scan and wrote up its subsequent report did not make any comment surrounding the character of atrophic patterns (e.g., generalized versus lobar predominant atrophy). Age advanced atrophy can increase the risk for the presence of a neurodegenerative illness. However, Ms. Athanas's testing and behavioral patterns are not particularly compelling for any type of illness at the present time. Despite trouble learning new information efficiently, she was able to retain learned information well. Memory patterns, especially when combined with intact confrontation naming, are not currently worrisome for classically presenting Alzheimer's disease. Deficits in executive functioning and expressive language can be worrisome for frontotemporal lobar degeneration. However, neuroimaging did not comment on advanced frontal or temporal lobe atrophy and she and her husband denied prominent personality changes or severe day-to-day language dysfunction, making this disease process less likely. She also denied parkinsonian symptoms, visual hallucinations, or REM sleep behaviors, making Lewy body disease or another more rare parkinsonian presentation unlikely.   Overall, the most likely explanation for weakness would appear to her advanced cerebrovascular disease, exacerbated by her recent hospitalizations. Continued medical monitoring will be important moving forward.   Recommendations: A repeat neuropsychological evaluation in 18-24 months (or sooner if functional decline is noted) is recommended to assess the  trajectory of cognitive abilities over time. This will also aid in future efforts towards improved diagnostic clarity.  Hearing loss was apparent both during interview and testing procedures. I would recommend that she discuss a referral for an audiologic evaluation with her PCP so this can be corrected if necessary.   Performance across neurocognitive testing is not a strong predictor of an individual's safety operating a motor vehicle. Should  her family wish to pursue a formalized driving evaluation, they could reach out to the following agencies: [employer] in [place]: [phone] [facility]: [phone] [facility]: [phone] [facility]: [phone] or [phone]  Should there be progression of current deficits over time, Ms. Kushner is unlikely to regain any independent living skills lost. Therefore, it is recommended that she remain as involved as possible in all aspects of household chores, finances, and medication management, with supervision to ensure adequate performance. She will likely benefit from the establishment and maintenance of a routine in order to maximize her functional abilities over time.  If not already done, Ms. Alexa and her family may want to discuss her wishes regarding durable power of attorney and medical decision making, so that she can have input into these choices. If they require legal assistance with this, long-term care resource access, or other aspects of estate planning, they could reach out to [employer] at [phone] for a free consultation.   Ms. Delao is encouraged to attend to lifestyle factors for brain health (e.g., regular physical exercise, good nutrition habits and consideration of the MIND-DASH diet, regular participation in cognitively-stimulating activities, and general stress management techniques), which are likely to have benefits for both emotional adjustment and cognition. Optimal  control of vascular risk factors (including safe cardiovascular exercise and adherence to dietary recommendations) is encouraged. Continued participation in activities which provide mental stimulation and social interaction is also recommended.   Memory can be improved using internal strategies such as rehearsal, repetition, chunking, mnemonics, association, and imagery. External strategies such as written notes in a consistently used memory journal, visual and nonverbal auditory cues such as a calendar on the refrigerator or appointments with alarm, such as on a cell phone, can also help maximize recall.    Because she shows better recall for structured information, she will likely understand and retain new information better if it is presented to her in a meaningful or well-organized manner at the outset, such as grouping items into meaningful categories or presenting information in an outlined, bulleted, or story format.  To address problems with processing speed, she may wish to consider:   -Ensuring that she is alerted when essential material or instructions are being presented   -Adjusting the speed at which new information is presented   -Allowing for more time in comprehending, processing, and responding in conversation   -Repeating and paraphrasing instructions or conversations aloud  To address problems with fluctuating attention and/or executive dysfunction, she may wish to consider:   -Avoiding external distractions when needing to concentrate   -Limiting exposure to fast paced environments with multiple sensory demands   -Writing down complicated information and using checklists   -Attempting and completing one task at a time (i.e., no multi-tasking)   -Verbalizing aloud each step of a task to maintain focus   -Taking frequent breaks during the completion of steps/tasks to avoid fatigue   -Reducing the amount of information considered at one time   -Scheduling more difficult  activities for a time of day where she is usually most alert  Review of Records:   Ms. Goodell was seen in the ED on 03/18/2023 presenting in a hypertensive crisis with accompanying fatigue, loss of appetite, and difficulty with speech and balance. She was admitted and treated for an acute metabolic encephalopathy presentation due to presumed hypertensive emergency and hyponatremia. Her encephalopathy improved with adequate management of her blood pressure and hyponatremia improved with fluid restrictions. She  reportedly performed poorly on a very brief cognitive screening task (Short Blessed Test) with OT and was referred to outpatient neurology. She was discharged on 03/22/2023.  She was seen back in the ED on 03/25/2023 due to onset of nausea and vomiting. In the ED, she was again found to be hyponatremic with sodium levels at 122. She improved with treatment and was discharged home on 03/28/2023.  She was seen by [facility] Marlowe Kays, PA-C) on 04/22/2023 for an evaluation of cognitive dysfunction. At that time, her husband expressed some concerns surrounding short-term memory dysfunction (e.g., details of recent conversations or names of certain individuals) ongoing for the past year. He also noted that she had seemed more repetitive in conversation. She denied large scale memory concerns, attributing these instances to suspected normal aging. She did allude to some word finding difficulties. ADLs were described as intact. Performance on a brief cognitive screening instrument (MOCA) was 21/30. Ultimately, Ms. Crate was referred for a comprehensive neuropsychological evaluation to characterize her cognitive abilities and to assist with diagnostic clarity and treatment planning.   Head CT on 07/28/2019 in the context of a fall with head impact revealed age-related volume loss with patchy periventricular microvascular ischemic disease. Brain MRI on 03/18/2023 revealed moderately advanced cerebral atrophy  (no mention of generalized atrophic findings or any lobar predilection) and moderate to severe microvascular ischemic disease.   Past Medical History:  Diagnosis Date   Acute lower UTI 08/12/2017   Acute metabolic encephalopathy 03/18/2023   Atrophy of vagina 05/14/2021   Cerebrovascular disease    moderate to severe per MRI   DDD (degenerative disc disease), cervical 10/13/2019   Decreased estrogen level 05/14/2021   Diabetic renal disease 05/14/2021   Essential hypertension 08/12/2017   Generalized anxiety disorder 05/14/2021   Hypertensive crisis 03/18/2023   Hypokalemia 08/12/2017   Hypomagnesemia 03/18/2023   Hyponatremia 08/12/2017   Interstitial cystitis 05/14/2021   Leukocytosis 05/14/2021   Major depressive disorder in remission 05/14/2021   Migraine without aura, not refractory 05/14/2021   Mild cognitive impairment of uncertain or unknown etiology 05/27/2023   Neck pain 08/28/2019   Osteopenia 05/14/2021   Pain in right hand 05/08/2019   Pincer nail deformity 01/24/2020   Prolonged QT interval 03/18/2023   Pure hypercholesterolemia 05/14/2021   Spinal stenosis in cervical region 10/13/2019   Subacute vulvitis 05/14/2021   Trigger finger of right hand 05/08/2019   Type 2 diabetes mellitus with hyperglycemia 08/12/2017   Urge incontinence of urine 05/14/2021    Past Surgical History:  Procedure Laterality Date   ABDOMINAL HYSTERECTOMY     HAND SURGERY Left     Current Outpatient Medications:    amLODipine (NORVASC) 5 MG tablet, Take 1 tablet (5 mg total) by mouth daily., Disp: 30 tablet, Rfl: 1   Ascorbic Acid (VITAMIN C) 500 MG CAPS, Take 500 mg by mouth daily., Disp: , Rfl:    Calcium Carbonate-Vitamin D 600-400 MG-UNIT tablet, Take 1 tablet by mouth daily., Disp: , Rfl:    Coenzyme Q10 (CO Q 10 PO), Take 1 capsule by mouth daily., Disp: , Rfl:    CRESTOR 5 MG tablet, Take 2.5 mg by mouth at bedtime. , Disp: , Rfl:    famotidine (PEPCID) 20 MG tablet, Take 20  mg by mouth daily., Disp: , Rfl:    glucosamine-chondroitin 500-400 MG tablet, Take 3 tablets by mouth daily. , Disp: , Rfl:    memantine (NAMENDA) 5 MG tablet, Take 1 tablet (5 mg at night)  for 2 weeks, then increase to 1 tablet (5 mg) twice a day, Disp: 60 tablet, Rfl: 3   metFORMIN (GLUCOPHAGE-XR) 500 MG 24 hr tablet, Take 1,000 mg by mouth 2 (two) times daily., Disp: , Rfl:    metoprolol tartrate (LOPRESSOR) 25 MG tablet, Take 25 mg by mouth 2 (two) times daily. , Disp: , Rfl:    Misc Natural Products (TART CHERRY ADVANCED) CAPS, Take 1 capsule by mouth daily., Disp: , Rfl:    Multiple Vitamin (MULTIVITAMIN WITH MINERALS) TABS tablet, Take 1 tablet by mouth daily., Disp: , Rfl:    oxybutynin (DITROPAN-XL) 10 MG 24 hr tablet, Take 10 mg by mouth daily. , Disp: , Rfl:    sertraline (ZOLOFT) 50 MG tablet, Take 75 mg by mouth daily.  (Patient not taking: Reported on 04/22/2023), Disp: , Rfl:    spironolactone (ALDACTONE) 25 MG tablet, Take 0.5 tablets (12.5 mg total) by mouth daily., Disp: 30 tablet, Rfl: 2   telmisartan (MICARDIS) 80 MG tablet, Take 1 tablet (80 mg total) by mouth daily., Disp: 30 tablet, Rfl: 0  Clinical Interview:   The following information was obtained during a clinical interview with Ms. Bermea and her husband prior to cognitive testing.  Cognitive Symptoms: Decreased short-term memory: Denied. Ms. Grajewski did acknowledge some forgetfulness at times but emphasized her belief that information will come to her with time and that these experiences were age-appropriate. Her husband reported mildly increased memory concerns. In particular, he noted concerns for greater repetition and some rapid forgetting in day-to-day conversation. Difficulties were said to be present for the past 6-12 years and potentially have exhibited slight decline.  Decreased long-term memory: Denied. Decreased attention/concentration: Denied. Reduced processing speed: Denied. Difficulties with executive  functions: Denied. Difficulties with emotion regulation: Denied. Difficulties with receptive language: Denied. Difficulties with word finding: Endorsed. Symptoms were said to be relatively mild. Her husband was in agreement, noting that he may need to finish her sentences from time to time.  Decreased visuoperceptual ability: Denied.  Difficulties completing ADLs: Denied.  Additional Medical History: History of traumatic brain injury/concussion: Ms. Pina denied any prior experiences. Her husband reminded her of a fall she experienced several years prior (possibly 2020 based upon a CT scan performed at that time) where she was evaluated at the ED and diagnosed with a concussion. No loss in consciousness was reported at the time and no persisting difficulties were described. No other potential head injuries were described.  History of stroke: Denied. History of seizure activity: Denied. History of known exposure to toxins: Denied. Symptoms of chronic pain: Denied. Experience of frequent headaches/migraines: Denied. She reported a remote history of migraine headaches which were thought to be hormonal in nature.  Frequent instances of dizziness/vertigo: Denied.  Sensory changes: She wears reading glasses with benefit. She alluded to some mild hearing loss but not to the extent that hearing aids were required. Other sensory changes/difficulties (e.g., taste or smell) were denied.  Balance/coordination difficulties: Denied. Other motor difficulties: Denied.  Other medical conditions: Her recent bouts with acute metabolic encephalopathy and hyponatremia are mentioned above. She noted currently feeling back to her typical baseline and denied persisting difficulties stemming from these hospitalizations.   Sleep History: Estimated hours obtained each night: 9-10 hours.  Difficulties falling asleep: Denied. Difficulties staying asleep: Denied. Feels rested and refreshed upon awakening:  Endorsed.  History of snoring: Endorsed. History of waking up gasping for air: Denied. Witnessed breath cessation while asleep: Denied.  History of vivid dreaming: Denied.  Excessive movement while asleep: Denied. Instances of acting out her dreams: Denied.  Psychiatric/Behavioral Health History: Depression: She reported a remote history of depression and has been prescribed sertraline for several years. She reported being in the process of tapering off this medication due to sodium-related side effects. She expressed a plan to see how she does without any mood-related medications once this is achieved. She denied current depressive symptoms. Current suicidal ideation, intent, or plan was also denied.  Anxiety: Denied. Mania: Denied. Trauma History: Denied. Visual/auditory hallucinations: Denied. Delusional thoughts: Denied.  Tobacco: Denied. Alcohol: She denied current alcohol consumption as well as a history of problematic alcohol abuse or dependence.  Recreational drugs: Denied.  Family History: Problem Relation Age of Onset   Hypertension Other    This information was confirmed by Ms. Santelli.  Academic/Vocational History: Highest level of educational attainment: 16 years. 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 She described herself as a good (A/B) student throughout academic settings. Math was noted as a relative weakness.  History of developmental delay: Denied. History of grade repetition: Denied. Enrollment in special education courses: Denied. History of LD/ADHD: Denied.  Employment: Retired.  Evaluation Results:   Behavioral Observations: Ms. Berteau was accompanied by her husband, arrived to her appointment on time, and was appropriately dressed and groomed. She appeared alert and oriented. Observed gait and station were within normal limits. Gross motor functioning appeared intact upon informal observation and no  abnormal movements (e.g., tremors) were noted. Her affect was generally relaxed and positive. Spontaneous speech was fluent and word finding difficulties were not observed during the clinical interview. Mild hearing loss was noted throughout. Thought processes were coherent, organized, and normal in content. Insight into her cognitive difficulties appeared limited in that she denied all cognitive dysfunction despite objective testing yielding several areas of dysfunction.   During testing, hearing loss was again apparent and task instructions had to be repeated multiple times in several instances. Sustained attention was appropriate. Task engagement was adequate and she persisted when challenged. Overall, Ms. Fernicola was cooperative with the clinical interview and subsequent testing procedures.   Adequacy of Effort: The validity of neuropsychological testing is limited by the extent to which the individual being tested may be assumed to have exerted adequate effort during testing. Ms. Schliep expressed her intention to perform to the best of her abilities and exhibited adequate task engagement and persistence. Scores across stand-alone and embedded performance validity measures were within expectation. As such, the results of the current evaluation are believed to be a valid representation of Ms. Belko's current cognitive functioning.  Test Results: Ms. Bierce was fully oriented at the time of the current evaluation.  Intellectual abilities based upon educational and vocational attainment were estimated to be in the average range. Premorbid abilities were estimated to be within the above average range based upon a single-word reading test.   Processing speed was variable, ranging from the exceptionally low to average normative ranges. Basic attention was mildly variable but overall appropriate, ranging from the below average to above average normative ranges. More complex attention (e.g., working memory) was  variable, ranging from the well below average to average normative ranges. Executive functioning was exceptionally low to well below average. She performed in the well above average range across a task assessing safety and judgment.  Assessed receptive language abilities were above average. Likewise, Ms. Debenedetto did not exhibit any difficulties comprehending task instructions and answered all questions asked of  her appropriately. Assessed expressive language was variable. Phonemic fluency was exceptionally low to below average, semantic fluency was exceptionally low to well below average, and confrontation naming was above average.    Assessed visuospatial/visuoconstructional abilities were variable, ranging from the exceptionally low to average normative ranges. Points were lost on her drawing of a clock due to incorrect hand placement. Points were lost on her copy of a complex figure due to a few mild visual distortions and one internal aspect being omitted entirely.    Learning (i.e., encoding) of novel verbal information was variable, ranging from the exceptionally low to average normative ranges. Spontaneous delayed recall (i.e., retrieval) of previously learned information was mildly variable but overall appropriate, ranging from the below average to above average normative ranges. Retention rates were 82% across a story learning task, 50% across a list learning task, and 100% across a figure drawing task. Performance across recognition tasks was variable but generally appropriate, suggesting some evidence for information consolidation.   Results of emotional screening instruments suggested that recent symptoms of generalized anxiety were in the minimal range, while symptoms of depression were within normal limits. A screening instrument assessing recent sleep quality suggested the presence of minimal sleep dysfunction.  Tables of Scores:   Note: This summary of test scores accompanies the  interpretive report and should not be considered in isolation without reference to the appropriate sections in the text. Descriptors are based on appropriate normative data and may be adjusted based on clinical judgment. Terms such as "Within Normal Limits" and "Outside Normal Limits" are used when a more specific description of the test score cannot be determined.       Percentile - Normative Descriptor > 98 - Exceptionally High 91-97 - Well Above Average 75-90 - Above Average 25-74 - Average 9-24 - Below Average 2-8 - Well Below Average < 2 - Exceptionally Low       Orientation:      Raw Score Percentile   NAB Orientation, Form 1 29/29 --- ---       Cognitive Screening:      Raw Score Percentile   SLUMS: 20/30 --- ---       RBANS, Form A: Standard Score/ Scaled Score Percentile   Total Score 81 10 Below Average  Immediate Memory 81 10 Below Average    List Learning 2 <1 Exceptionally Low    Story Memory 11 63 Average  Visuospatial/Constructional 75 5 Well Below Average    Figure Copy 5 5 Well Below Average    Line Orientation 14/20 17-25 Below Average  to Average  Language 88 21 Below Average    Picture Naming 10/10 51-75 Average    Semantic Fluency 5 5 Well Below Average  Attention 82 12 Below Average    Digit Span 7 16 Below Average    Coding 7 16 Below Average  Delayed Memory 103 58 Average    List Recall 2/10 10-16 Below Average    List Recognition 19/20 26-50 Average    Story Recall 10 50 Average    Story Recognition 12/12 69+ Average    Figure Recall 12 75 Above Average    Figure Recognition 3/8 4-8 Well Below Average        Intellectual Functioning:      Standard Score Percentile   Test of Premorbid Functioning: 117 87 Above Average       Attention/Executive Function:     Trail Making Test (TMT): Raw Score (T Score) Percentile  Part A 45 secs.,  0 errors (40) 16 Below Average    Part B 239 secs.,  0 errors (30) 2 Well Below Average         Scaled  Score Percentile   WAIS-IV Digit Span: 9 37 Average    Forward 12 75 Above Average    Backward 5 5 Well Below Average    Sequencing 9 37 Average        Scaled Score Percentile   WAIS-IV Similarities: 11 63 Average       D-KEFS Color-Word Interference Test: Raw Score (Scaled Score) Percentile     Color Naming 60 secs. (1) <1 Exceptionally Low    Word Reading 25 secs. (10) 50 Average    Inhibition 163 secs. (1) <1 Exceptionally Low      Total Errors 3 errors (10) 50 Average    Inhibition/Switching 160 secs. (1) <1 Exceptionally Low      Total Errors 3 errors (10) 50 Average       D-KEFS Verbal Fluency Test: Raw Score (Scaled Score) Percentile     Letter Total Correct 20 (6) 9 Below Average    Category Total Correct 19 (4) 2 Well Below Average    Category Switching Total Correct 7 (4) 2 Well Below Average    Category Switching Accuracy 6 (5) 5 Well Below Average      Total Set Loss Errors 1 (11) 63 Average      Total Repetition Errors 0 (13) 84 Above Average       NAB Executive Functions Module, Form 1: T Score Percentile     Judgment 66 95 Well Above Average       Language:     Verbal Fluency Test: Raw Score (T Score) Percentile     Phonemic Fluency (FAS) 20 (29) 2 Exceptionally Low    Animal Fluency 9 (25) 1 Exceptionally Low        NAB Language Module, Form 1: T Score Percentile     Auditory Comprehension 57 75 Above Average    Naming 31/31 (60) 84 Above Average       Visuospatial/Visuoconstruction:      Raw Score Percentile   Clock Drawing: 8/10 --- Within Normal Limits        Scaled Score Percentile   WAIS-IV Block Design: 3 1 Exceptionally Low       Mood and Personality:      Raw Score Percentile   Geriatric Depression Scale: 3 --- Within Normal Limits  Geriatric Anxiety Scale: 1 --- Minimal    Somatic 1 --- Minimal    Cognitive 0 --- Minimal    Affective 0 --- Minimal       Additional Questionnaires:      Raw Score Percentile   PROMIS Sleep Disturbance  Questionnaire: 16 --- None to Slight   Informed Consent and Coding/Compliance:   The current evaluation represents a clinical evaluation for the purposes previously outlined by the referral source and is in no way reflective of a forensic evaluation.   Ms. Nancarrow was provided with a verbal description of the nature and purpose of the present neuropsychological evaluation. Also reviewed were the foreseeable risks and/or discomforts and benefits of the procedure, limits of confidentiality, and mandatory reporting requirements of this provider. The patient was given the opportunity to ask questions and receive answers about the evaluation. Oral consent to participate was provided by the patient.   This evaluation was conducted by Newman Nickels, Ph.D., ABPP-CN, board certified clinical  neuropsychologist. Ms. Frome completed a clinical interview with Dr. Milbert Coulter, billed as one unit [phone], and 135 minutes of cognitive testing and scoring, billed as one unit [phone] and four additional units [id number]. Psychometrist Wallace Keller, B.S., assisted Dr. Milbert Coulter with test administration and scoring procedures. As a separate and discrete service, one unit [id number] and two units [phone] were billed for Dr. Tammy Sours time spent in interpretation and report writing.

## 2023-05-28 ENCOUNTER — Encounter: Payer: Self-pay | Admitting: Psychology

## 2023-05-28 DIAGNOSIS — I679 Cerebrovascular disease, unspecified: Secondary | ICD-10-CM | POA: Insufficient documentation

## 2023-06-01 ENCOUNTER — Ambulatory Visit (INDEPENDENT_AMBULATORY_CARE_PROVIDER_SITE_OTHER): Payer: PPO | Admitting: Psychology

## 2023-06-01 DIAGNOSIS — G3184 Mild cognitive impairment, so stated: Secondary | ICD-10-CM

## 2023-06-01 DIAGNOSIS — G9341 Metabolic encephalopathy: Secondary | ICD-10-CM

## 2023-06-01 DIAGNOSIS — I679 Cerebrovascular disease, unspecified: Secondary | ICD-10-CM

## 2023-06-01 NOTE — Progress Notes (Signed)
   Neuropsychology Feedback Session Eligha Bridegroom. [facility] [place] Department of Neurology  Reason for Referral:   Cindy Rhodes is a 77 y.o. right-handed Caucasian female referred by Marlowe Kays, PA-C, to characterize her current cognitive functioning and assist with diagnostic clarity and treatment planning in the context of subjective cognitive decline.   Feedback:   Ms. Pins completed a comprehensive neuropsychological evaluation on 05/27/2023. Please refer to that encounter for the full report and recommendations. Briefly, results suggested ongoing impairment surrounding executive functioning and verbal fluency (both phonemic and semantic). Further performance variability was exhibited across a majority of other assessed domains. This includes processing speed, attention/concentration, visuospatial abilities, and encoding (i.e., learning) aspects of memory. Regarding etiology, Ms. Rosenzweig was admitted to the ED on two separate occasions this past April and diagnosed with acute metabolic encephalopathy, likely secondary to hypertensive crisis and hyponatremia. Cognitive deficits commonly accompany metabolic encephalopathy presentations. These deficits often persist following medical stabilization and exhibit gradual recovery times over the subsequent months. Her pattern across current testing, especially with executive impairment and prominent, diffuse variability across the majority of assessed domains, is certainly a reasonable finding given that her encephalopathic experience was only two months prior to the current evaluation. Given that prior neuroimaging revealed moderate to severe microvascular ischemic disease, there is a good likelihood that cognitive dysfunction was present to some degree before her hospitalizations. However, this event exacerbating deficits is certainly a reasonable possibility.   Ms. Oishi was accompanied by her husband during the current feedback session. Content  of the current session focused on the results of her neuropsychological evaluation. Ms. Dufault was given the opportunity to ask questions and her questions were answered. She was encouraged to reach out should additional questions arise. A copy of her report was provided at the conclusion of the visit.      One unit [phone] was billed for Dr. Tammy Sours time spent preparing for, conducting, and documenting the current feedback session with Ms. Sesay.

## 2023-06-02 DIAGNOSIS — Z1231 Encounter for screening mammogram for malignant neoplasm of breast: Secondary | ICD-10-CM | POA: Diagnosis not present

## 2023-06-25 DIAGNOSIS — N3000 Acute cystitis without hematuria: Secondary | ICD-10-CM | POA: Diagnosis not present

## 2023-06-25 DIAGNOSIS — N3020 Other chronic cystitis without hematuria: Secondary | ICD-10-CM | POA: Diagnosis not present

## 2023-07-01 DIAGNOSIS — N3000 Acute cystitis without hematuria: Secondary | ICD-10-CM | POA: Diagnosis not present

## 2023-07-09 ENCOUNTER — Ambulatory Visit: Payer: PPO | Admitting: Physician Assistant

## 2023-07-09 ENCOUNTER — Encounter: Payer: Self-pay | Admitting: Physician Assistant

## 2023-07-09 VITALS — BP 122/80 | HR 74 | Resp 18 | Ht 61.0 in | Wt 134.0 lb

## 2023-07-09 DIAGNOSIS — G3184 Mild cognitive impairment, so stated: Secondary | ICD-10-CM

## 2023-07-09 NOTE — Progress Notes (Signed)
Assessment/Plan:   Mild cognitive impairment, likely of vascular etiology and recurrent metabolic encephalopathies  Cindy Rhodes is a very pleasant 77 y.o. RH female with a history of prolonged QT interval, hypertension, hyperlipidemia, DM2 with retinopathy, anxiety, depression, recent hospitalization in April 2024 for hyponatremia due to excessive water intake, and a diagnosis of mild cognitive impairment likely due to vascular disease, recurrent metabolic encephalopathies  presenting today in follow-up for evaluation of memory loss. Patient is on memantine 5 mg but she was mistakenly not taking it as directed, taking it only during the day.  Although she reports that is actually better.  She continues to be independent with her ADLs, and continues to drive without difficulty.  Mood is good.  Memory stable   Recommendations:   Follow up in 6 months. Repeat neuropsych evaluation in 18 to 24 months for clarity of the diagnosis and disease trajectory Continue memantine but as directed, 5 mg twice daily (QT prolongation).  Side effects discussed Recommend checking hearing for better comprehension Recommend good control of cardiovascular risk factors Continue to control mood as per PCP   Subjective:   This patient is accompanied in the office by her husband   who supplements the history. Previous records as well as any outside records available were reviewed prior to todays visit.   Patient was last seen on 04/22/2023, with MoCA of 21/30.     Any changes in memory since last visit? "  There is definitely an improvement" she reports that is easier to remember some things.  She likes to do puzzles, word search , trivia night and other brain stimulating exercises.  She participates in activities provided at her independent living facility. repeats oneself?  Endorsed, especially with questions Disoriented when walking into a room?  Patient denies   Leaving objects in unusual places?  Patient  denies   Wandering behavior?   denies   Any personality changes since last visit?   denies   Any worsening depression?: denies   Hallucinations or paranoia?  denies   Seizures?   denies    Any sleep changes? Sleeps well. Denies vivid dreams, REM behavior or sleepwalking   Sleep apnea?   denies    Any hygiene concerns?   denies   Independent of bathing and dressing?  Endorsed  Does the patient needs help with medications? Patient is in charge   Who is in charge of the finances? Husband is in charge     Any changes in appetite?  denies     Patient have trouble swallowing?  denies   Does the patient cook?  No  Any headaches?    denies   Vision changes?  "I never had very good depth perception". Chronic pain?  denies   Ambulates with difficulty?  denies     Recent falls or head injuries?    denies      Unilateral weakness, numbness or tingling?   denies   Any tremors?  denies   Any anosmia?    denies   Any incontinence of urine?  She has a history of urge incontinence Any bowel dysfunction?  Occasionally has loose stools.     Patient lives with her husband at [facility] Does the patient drive?yes, no issues.   Neuropsychological evaluation on June 2024 Briefly, results suggested ongoing impairment surrounding executive functioning and verbal fluency (both phonemic and semantic). Further performance variability was exhibited across a majority of other assessed domains. This includes processing speed, attention/concentration, visuospatial abilities,  and encoding (i.e., learning) aspects of memory. Regarding etiology, [name] was admitted to the ED on two separate occasions this past April and diagnosed with acute metabolic encephalopathy, likely secondary to hypertensive crisis and hyponatremia. Cognitive deficits commonly accompany metabolic encephalopathy presentations. These deficits often persist following medical stabilization and exhibit gradual recovery times over the subsequent  months. Her pattern across current testing, especially with executive impairment and prominent, diffuse variability across the majority of assessed domains, is certainly a reasonable finding given that her encephalopathic experience was only two months prior to the current evaluation. Given that prior neuroimaging revealed moderate to severe microvascular ischemic disease, there is a good likelihood that cognitive dysfunction was present to some degree before her hospitalizations. However, this event exacerbating deficits is certainly a reasonable possibility.  Initial visit 04/22/2023  How long did patient have memory difficulties? Husband reports that has noticed some cognitive changes over the past year.  She did not notice any changes other than " some aging". Patient has some difficulty remembering recent conversations and people names. "Nothing more than the average person at my age" . She may have some difficulty finding the correct word.  "I always look for the right word"-she says . Likes to do puzzles, word search and other brain stimulating exercises.  repeats oneself?  Endorsed. "She will ask the same question"-husband says Disoriented when walking into a room?  Patient denies   Leaving objects in unusual places? denies   Wandering behavior?  denies   Any personality changes?  Patient denies   Any history of depression?:  Patient denies   Hallucinations or paranoia?  Patient denies   Seizures?   Patient denies    Any sleep changes?  "My sleep is not as sound as it used to be". Reports vivid dreams, REM behavior or sleepwalking   Sleep apnea?  Patient denies   Any hygiene concerns?  Patient denies   Independent of bathing and dressing?  Endorsed  Does the patient needs help with medications?  Patient is in charge   Who is in charge of the finances? Patient and husband in charge     Any changes in appetite?  denies     Patient have trouble swallowing? denies   Does the patient cook?  "I   don't really cook, I am the cleaner".    Any kitchen accidents such as leaving the stove on? denies   Any headaches?  Only when she was having periods "stopped around age 26" Chronic back pain ? denies   Ambulates with difficulty?  'Balance issues" doing PT, for this last week. She [facility] to [facility]  Recent falls or head injuries? Not recently. 3 years ago she fell and had a concussion.  Vision changes?" Never had very good depth perception"  Unilateral weakness, numbness or tingling? denies   Any tremors?   denies   Any anosmia?  denies   Any incontinence of urine?  She has a history of urge incontinence Any bowel dysfunction?  Occasionally she has lose stools.  Patient lives  [facility]  History of heavy alcohol intake? denies   History of heavy tobacco use? denies   Family history of dementia? denies  Does patient drive? Yes, no issues   Graduated from College Retired from HR at [employer]   Past Medical History:  Diagnosis Date   Acute lower UTI 08/12/2017   Acute metabolic encephalopathy 03/18/2023   Atrophy of vagina 05/14/2021   Cerebrovascular  disease    moderate to severe per MRI   DDD (degenerative disc disease), cervical 10/13/2019   Decreased estrogen level 05/14/2021   Diabetic renal disease 05/14/2021   Essential hypertension 08/12/2017   Generalized anxiety disorder 05/14/2021   Hypertensive crisis 03/18/2023   Hypokalemia 08/12/2017   Hypomagnesemia 03/18/2023   Hyponatremia 08/12/2017   Interstitial cystitis 05/14/2021   Leukocytosis 05/14/2021   Major depressive disorder in remission 05/14/2021   Migraine without aura, not refractory 05/14/2021   Mild cognitive impairment of uncertain or unknown etiology 05/27/2023   Neck pain 08/28/2019   Osteopenia 05/14/2021   Pain in right hand 05/08/2019   Pincer nail deformity 01/24/2020   Prolonged QT interval 03/18/2023   Pure hypercholesterolemia 05/14/2021   Spinal  stenosis in cervical region 10/13/2019   Subacute vulvitis 05/14/2021   Trigger finger of right hand 05/08/2019   Type 2 diabetes mellitus with hyperglycemia 08/12/2017   Urge incontinence of urine 05/14/2021     Past Surgical History:  Procedure Laterality Date   ABDOMINAL HYSTERECTOMY     HAND SURGERY Left      PREVIOUS MEDICATIONS:   CURRENT MEDICATIONS:  Outpatient Encounter Medications as of 07/09/2023  Medication Sig   amLODipine (NORVASC) 5 MG tablet Take 1 tablet (5 mg total) by mouth daily.   Ascorbic Acid (VITAMIN C) 500 MG CAPS Take 500 mg by mouth daily.   Calcium Carbonate-Vitamin D 600-400 MG-UNIT tablet Take 1 tablet by mouth daily.   Coenzyme Q10 (CO Q 10 PO) Take 1 capsule by mouth daily.   CRESTOR 5 MG tablet Take 2.5 mg by mouth at bedtime.    famotidine (PEPCID) 20 MG tablet Take 20 mg by mouth daily.   glucosamine-chondroitin 500-400 MG tablet Take 3 tablets by mouth daily.    memantine (NAMENDA) 5 MG tablet Take 1 tablet (5 mg at night) for 2 weeks, then increase to 1 tablet (5 mg) twice a day   metFORMIN (GLUCOPHAGE-XR) 500 MG 24 hr tablet Take 1,000 mg by mouth 2 (two) times daily.   metoprolol tartrate (LOPRESSOR) 25 MG tablet Take 25 mg by mouth 2 (two) times daily.    Misc Natural Products (TART CHERRY ADVANCED) CAPS Take 1 capsule by mouth daily.   Multiple Vitamin (MULTIVITAMIN WITH MINERALS) TABS tablet Take 1 tablet by mouth daily.   oxybutynin (DITROPAN-XL) 10 MG 24 hr tablet Take 10 mg by mouth daily.    sertraline (ZOLOFT) 50 MG tablet Take 75 mg by mouth daily.  (Patient not taking: Reported on 04/22/2023)   spironolactone (ALDACTONE) 25 MG tablet Take 0.5 tablets (12.5 mg total) by mouth daily.   telmisartan (MICARDIS) 80 MG tablet Take 1 tablet (80 mg total) by mouth daily.   [DISCONTINUED] telmisartan-hydrochlorothiazide (MICARDIS HCT) 80-25 MG tablet Take 1 tablet by mouth daily.    No facility-administered encounter medications on file as of  07/09/2023.     Objective:     PHYSICAL EXAMINATION:    VITALS:  There were no vitals filed for this visit.  GEN:  The patient appears stated age and is in NAD. HEENT:  Normocephalic, atraumatic.   Neurological examination:  General: NAD, well-groomed, appears stated age. Orientation: The patient is alert. Oriented to person, place and date Cranial nerves: There is good facial symmetry.The speech is fluent and clear. No aphasia or dysarthria. Fund of knowledge is appropriate. Recent memory impaired and remote memory is normal.  Attention and concentration are normal.  Able to name objects and repeat phrases.  Hearing is decreased to conversational tone   Sensation: Sensation is intact to light touch throughout Motor: Strength is at least antigravity x4. DTR's 2/4 in UE/LE      04/22/2023    2:00 PM  Montreal Cognitive Assessment   Visuospatial/ Executive (0/5) 3  Naming (0/3) 3  Attention: Read list of digits (0/2) 0  Attention: Read list of letters (0/1) 1  Attention: Serial 7 subtraction starting at 100 (0/3) 3  Language: Repeat phrase (0/2) 2  Language : Fluency (0/1) 0  Abstraction (0/2) 2  Delayed Recall (0/5) 1  Orientation (0/6) 6  Total 21  Adjusted Score (based on education) 21        No data to display             Movement examination: Tone: There is normal tone in the UE/LE Abnormal movements:  no tremor.  No myoclonus.  No asterixis.   Coordination:  There is no decremation with RAM's. Normal finger to nose  Gait and Station: The patient has no difficulty arising out of a deep-seated chair without the use of the hands. The patient's stride length is good.  Gait is cautious and narrow.   Thank you for allowing [place] the opportunity to participate in the care of this nice patient. Please do not hesitate to contact us for any questions or concerns.   Total time spent on today's visit was 22 minutes dedicated to this patient today, preparing to see patient,  examining the patient, ordering tests and/or medications and counseling the patient, documenting clinical information in the EHR or other health record, independently interpreting results and communicating results to the patient/family, discussing treatment and goals, answering patient's questions and coordinating care.  Cc:  Renford Dills, MD  Marlowe Kays 07/09/2023 7:25 AM

## 2023-07-09 NOTE — Patient Instructions (Addendum)
It was a pleasure to see you today at our office.   Recommendations:    Follow up in 6  months Continue Memantine 5mg  tablets  twice daily.        For assessment of decision of mental capacity and competency:  Call Dr. Erick Blinks, geriatric psychiatrist at [phone] Counseling regarding caregiver distress, including caregiver depression, anxiety and issues regarding community resources, adult day care programs, adult living facilities, or memory care questions:  please contact your  Primary Doctor's Social Worker  Whom to call: Memory  decline, memory medications: Call our office [phone]  For psychiatric meds, mood meds: Please have your primary care physician manage these medications.  If you have any severe symptoms of a stroke, or other severe issues such as confusion,severe chills or fever, etc call 911 or go to the ER as you may need to be evaluated further    RECOMMENDATIONS FOR ALL PATIENTS WITH MEMORY PROBLEMS: 1. Continue to exercise (Recommend 30 minutes of walking everyday, or 3 hours every week) 2. Increase social interactions - continue going to [place] and enjoy social gatherings with friends and family 3. Eat healthy, avoid fried foods and eat more fruits and vegetables 4. Maintain adequate blood pressure, blood sugar, and blood cholesterol level. Reducing the risk of stroke and cardiovascular disease also helps promoting better memory. 5. Avoid stressful situations. Live a simple life and avoid aggravations. Organize your time and prepare for the next day in anticipation. 6. Sleep well, avoid any interruptions of sleep and avoid any distractions in the bedroom that may interfere with adequate sleep quality 7. Avoid sugar, avoid sweets as there is a strong link between excessive sugar intake, diabetes, and cognitive impairment We discussed the Mediterranean diet, which has been shown to help patients reduce the risk of progressive memory disorders and reduces  cardiovascular risk. This includes eating fish, eat fruits and green leafy vegetables, nuts like almonds and hazelnuts, walnuts, and also use olive oil. Avoid fast foods and fried foods as much as possible. Avoid sweets and sugar as sugar use has been linked to worsening of memory function.  There is always a concern of gradual progression of memory problems. If this is the case, then we may need to adjust level of care according to patient needs. Support, both to the patient and caregiver, should then be put into place.     FALL PRECAUTIONS: Be cautious when walking. Scan the area for obstacles that may increase the risk of trips and falls. When getting up in the mornings, sit up at the edge of the bed for a few minutes before getting out of bed. Consider elevating the bed at the head end to avoid drop of blood pressure when getting up. Walk always in a well-lit room (use night lights in the walls). Avoid area rugs or power cords from appliances in the middle of the walkways. Use a walker or a cane if necessary and consider physical therapy for balance exercise. Get your eyesight checked regularly.  FINANCIAL OVERSIGHT: Supervision, especially oversight when making financial decisions or transactions is also recommended.  HOME SAFETY: Consider the safety of the kitchen when operating appliances like stoves, microwave oven, and blender. Consider having supervision and share cooking responsibilities until no longer able to participate in those. Accidents with firearms and other hazards in the house should be identified and addressed as well.   ABILITY TO BE LEFT ALONE: If patient is unable to contact 911 operator, consider using LifeLine, or  when the need is there, arrange for someone to stay with patients. Smoking is a fire hazard, consider supervision or cessation. Risk of wandering should be assessed by caregiver and if detected at any point, supervision and safe proof recommendations should be  instituted.  MEDICATION SUPERVISION: Inability to self-administer medication needs to be constantly addressed. Implement a mechanism to ensure safe administration of the medications.   DRIVING: Regarding driving, in patients with progressive memory problems, driving will be impaired. We advise to have someone else do the driving if trouble finding directions or if minor accidents are reported. Independent driving assessment is available to determine safety of driving.      Mediterranean Diet A Mediterranean diet refers to food and lifestyle choices that are based on the traditions of countries located on the [employer]. This way of eating has been shown to help prevent certain conditions and improve outcomes for people who have chronic diseases, like kidney disease and heart disease. What are tips for following this plan? Lifestyle  Cook and eat meals together with your family, when possible. Drink enough fluid to keep your urine clear or pale yellow. Be physically active every day. This includes: Aerobic exercise like running or swimming. Leisure activities like gardening, walking, or housework. Get 7-8 hours of sleep each night. If recommended by your health care provider, drink red wine in moderation. This means 1 glass a day for nonpregnant women and 2 glasses a day for men. A glass of wine equals 5 oz (150 mL). Reading food labels  Check the serving size of packaged foods. For foods such as rice and pasta, the serving size refers to the amount of cooked product, not dry. Check the total fat in packaged foods. Avoid foods that have saturated fat or trans fats. Check the ingredients list for added sugars, such as corn syrup. Shopping  At the grocery store, buy most of your food from the areas near the walls of the store. This includes: Fresh fruits and vegetables (produce). Grains, beans, nuts, and seeds. Some of these may be available in unpackaged forms or large amounts (in  bulk). Fresh seafood. Poultry and eggs. Low-fat dairy products. Buy whole ingredients instead of prepackaged foods. Buy fresh fruits and vegetables in-season from local farmers markets. Buy frozen fruits and vegetables in resealable bags. If you do not have access to quality fresh seafood, buy precooked frozen shrimp or canned fish, such as tuna, salmon, or sardines. Buy small amounts of raw or cooked vegetables, salads, or olives from the deli or salad bar at your store. Stock your pantry so you always have certain foods on hand, such as olive oil, canned tuna, canned tomatoes, rice, pasta, and beans. Cooking  Cook foods with extra-virgin olive oil instead of using butter or other vegetable oils. Have meat as a side dish, and have vegetables or grains as your main dish. This means having meat in small portions or adding small amounts of meat to foods like pasta or stew. Use beans or vegetables instead of meat in common dishes like chili or lasagna. Experiment with different cooking methods. Try roasting or broiling vegetables instead of steaming or sauteing them. Add frozen vegetables to soups, stews, pasta, or rice. Add nuts or seeds for added healthy fat at each meal. You can add these to yogurt, salads, or vegetable dishes. Marinate fish or vegetables using olive oil, lemon juice, garlic, and fresh herbs. Meal planning  Plan to eat 1 vegetarian meal one day each week. Try  to work up to 2 vegetarian meals, if possible. Eat seafood 2 or more times a week. Have healthy snacks readily available, such as: Vegetable sticks with hummus. Greek yogurt. Fruit and nut trail mix. Eat balanced meals throughout the week. This includes: Fruit: 2-3 servings a day Vegetables: 4-5 servings a day Low-fat dairy: 2 servings a day Fish, poultry, or lean meat: 1 serving a day Beans and legumes: 2 or more servings a week Nuts and seeds: 1-2 servings a day Whole grains: 6-8 servings a day Extra-virgin  olive oil: 3-4 servings a day Limit red meat and sweets to only a few servings a month What are my food choices? Mediterranean diet Recommended Grains: Whole-grain pasta. Brown rice. Bulgar wheat. Polenta. Couscous. Whole-wheat bread. Orpah Cobb. Vegetables: Artichokes. Beets. Broccoli. Cabbage. Carrots. Eggplant. Green beans. Chard. Kale. Spinach. Onions. Leeks. Peas. Squash. Tomatoes. Peppers. Radishes. Fruits: Apples. Apricots. Avocado. Berries. Bananas. Cherries. Dates. Figs. Grapes. Lemons. Melon. Oranges. Peaches. Plums. Pomegranate. Meats and other protein foods: Beans. Almonds. Sunflower seeds. Pine nuts. Peanuts. Cod. Salmon. Scallops. Shrimp. Tuna. Tilapia. Clams. Oysters. Eggs. Dairy: Low-fat milk. Cheese. Greek yogurt. Beverages: Water. Red wine. Herbal tea. Fats and oils: Extra virgin olive oil. Avocado oil. Grape seed oil. Sweets and desserts: [place] yogurt with honey. Baked apples. Poached pears. Trail mix. Seasoning and other foods: Basil. Cilantro. Coriander. Cumin. Mint. Parsley. Sage. Rosemary. Tarragon. Garlic. Oregano. Thyme. Pepper. Balsalmic vinegar. Tahini. Hummus. Tomato sauce. Olives. Mushrooms. Limit these Grains: Prepackaged pasta or rice dishes. Prepackaged cereal with added sugar. Vegetables: Deep fried potatoes (french fries). Fruits: Fruit canned in syrup. Meats and other protein foods: Beef. Pork. Lamb. Poultry with skin. Hot dogs. Tomasa Blase. Dairy: Ice cream. Sour cream. Whole milk. Beverages: Juice. Sugar-sweetened soft drinks. Beer. Liquor and spirits. Fats and oils: Butter. Canola oil. Vegetable oil. Beef fat (tallow). Lard. Sweets and desserts: Cookies. Cakes. Pies. Candy. Seasoning and other foods: Mayonnaise. Premade sauces and marinades. The items listed may not be a complete list. Talk with your dietitian about what dietary choices are right for you. Summary The Mediterranean diet includes both food and lifestyle choices. Eat a variety of fresh  fruits and vegetables, beans, nuts, seeds, and whole grains. Limit the amount of red meat and sweets that you eat. Talk with your health care provider about whether it is safe for you to drink red wine in moderation. This means 1 glass a day for nonpregnant women and 2 glasses a day for men. A glass of wine equals 5 oz (150 mL). This information is not intended to replace advice given to you by your health care provider. Make sure you discuss any questions you have with your health care provider. Document Released: 07/09/2016 Document Revised: 09/12/       Neuropsychology Feedback [facility] [place]. [facility] Department of Neurology  Reason for Referral:   Cindy Rhodes is a 77 y.o. right-handed Caucasian female referred by Marlowe Kays, PA-C, to characterize her current cognitive functioning and assist with diagnostic clarity and treatment planning in the context of subjective cognitive decline.   Feedback:   Cindy Rhodes completed a comprehensive neuropsychological evaluation on 05/27/2023. Please refer to that encounter for the full report and recommendations. Briefly, results suggested ongoing impairment surrounding executive functioning and verbal fluency (both phonemic and semantic). Further performance variability was exhibited across a majority of other assessed domains. This includes processing speed, attention/concentration, visuospatial abilities, and encoding (i.e., learning) aspects of memory. Regarding etiology, Cindy Rhodes was admitted to  the ED on two separate occasions this past April and diagnosed with acute metabolic encephalopathy, likely secondary to hypertensive crisis and hyponatremia. Cognitive deficits commonly accompany metabolic encephalopathy presentations. These deficits often persist following medical stabilization and exhibit gradual recovery times over the subsequent months. Her pattern across current testing, especially with executive impairment and  prominent, diffuse variability across the majority of assessed domains, is certainly a reasonable finding given that her encephalopathic experience was only two months prior to the current evaluation. Given that prior neuroimaging revealed moderate to severe microvascular ischemic disease, there is a good likelihood that cognitive dysfunction was present to some degree before her hospitalizations. However, this event exacerbating deficits is certainly a reasonable possibility.   Cindy Rhodes was accompanied by her husband during the current feedback session. Content of the current session focused on the results of her neuropsychological evaluation. Cindy Rhodes was given the opportunity to ask questions and her questions were answered. She was encouraged to reach out should additional questions arise. A copy of her report was provided at the conclusion of the visit.

## 2023-07-23 DIAGNOSIS — N3020 Other chronic cystitis without hematuria: Secondary | ICD-10-CM | POA: Diagnosis not present

## 2023-07-23 DIAGNOSIS — R8271 Bacteriuria: Secondary | ICD-10-CM | POA: Diagnosis not present

## 2023-07-27 DIAGNOSIS — Z08 Encounter for follow-up examination after completed treatment for malignant neoplasm: Secondary | ICD-10-CM | POA: Diagnosis not present

## 2023-07-27 DIAGNOSIS — L821 Other seborrheic keratosis: Secondary | ICD-10-CM | POA: Diagnosis not present

## 2023-07-27 DIAGNOSIS — Z85828 Personal history of other malignant neoplasm of skin: Secondary | ICD-10-CM | POA: Diagnosis not present

## 2023-07-27 DIAGNOSIS — D225 Melanocytic nevi of trunk: Secondary | ICD-10-CM | POA: Diagnosis not present

## 2023-07-27 DIAGNOSIS — D2271 Melanocytic nevi of right lower limb, including hip: Secondary | ICD-10-CM | POA: Diagnosis not present

## 2023-07-27 DIAGNOSIS — L814 Other melanin hyperpigmentation: Secondary | ICD-10-CM | POA: Diagnosis not present

## 2023-08-11 DIAGNOSIS — G3184 Mild cognitive impairment, so stated: Secondary | ICD-10-CM | POA: Diagnosis not present

## 2023-08-11 DIAGNOSIS — E871 Hypo-osmolality and hyponatremia: Secondary | ICD-10-CM | POA: Diagnosis not present

## 2023-08-11 DIAGNOSIS — E7800 Pure hypercholesterolemia, unspecified: Secondary | ICD-10-CM | POA: Diagnosis not present

## 2023-08-11 DIAGNOSIS — M8588 Other specified disorders of bone density and structure, other site: Secondary | ICD-10-CM | POA: Diagnosis not present

## 2023-08-11 DIAGNOSIS — F325 Major depressive disorder, single episode, in full remission: Secondary | ICD-10-CM | POA: Diagnosis not present

## 2023-08-11 DIAGNOSIS — E1121 Type 2 diabetes mellitus with diabetic nephropathy: Secondary | ICD-10-CM | POA: Diagnosis not present

## 2023-08-11 DIAGNOSIS — I10 Essential (primary) hypertension: Secondary | ICD-10-CM | POA: Diagnosis not present

## 2023-08-11 DIAGNOSIS — Z23 Encounter for immunization: Secondary | ICD-10-CM | POA: Diagnosis not present

## 2023-08-17 ENCOUNTER — Other Ambulatory Visit: Payer: Self-pay | Admitting: Physician Assistant

## 2023-08-18 ENCOUNTER — Encounter: Payer: Self-pay | Admitting: Podiatry

## 2023-08-18 ENCOUNTER — Ambulatory Visit: Payer: PPO | Admitting: Podiatry

## 2023-08-18 DIAGNOSIS — E119 Type 2 diabetes mellitus without complications: Secondary | ICD-10-CM | POA: Diagnosis not present

## 2023-08-18 DIAGNOSIS — L608 Other nail disorders: Secondary | ICD-10-CM

## 2023-08-18 DIAGNOSIS — M79676 Pain in unspecified toe(s): Secondary | ICD-10-CM

## 2023-08-18 DIAGNOSIS — B351 Tinea unguium: Secondary | ICD-10-CM

## 2023-08-18 NOTE — Progress Notes (Signed)
This patient returns to my office for at risk foot care.  This patient requires this care by a professional since this patient will be at risk due to having diabetes.  This patient is unable to cut nails herself since the patient cannot reach her nails.These nails are painful walking and wearing shoes.  This patient presents for at risk foot care today.  General Appearance  Alert, conversant and in no acute stress.  Vascular  Dorsalis pedis and posterior tibial  pulses are palpable  bilaterally.  Capillary return is within normal limits  bilaterally. Temperature is within normal limits  bilaterally.  Neurologic  Senn-Weinstein monofilament wire test within normal limits  bilaterally. Muscle power within normal limits bilaterally.  Nails Thick disfigured discolored nails with subungual debris  from hallux to fifth toes bilaterally. No evidence of bacterial infection or drainage bilaterally.  Orthopedic  No limitations of motion  feet .  No crepitus or effusions noted.  No bony pathology or digital deformities noted.  Skin  normotropic skin with no porokeratosis noted bilaterally.  No signs of infections or ulcers noted.     Onychomycosis  Pain in right toes  Pain in left toes  Consent was obtained for treatment procedures.   Mechanical debridement of nails 1-5  bilaterally performed with a nail nipper.  Filed with dremel without incident.    Return office visit     10 weeks                 Told patient to return for periodic foot care and evaluation due to potential at risk complications.   Helane Gunther DPM

## 2023-10-19 DIAGNOSIS — R2681 Unsteadiness on feet: Secondary | ICD-10-CM | POA: Diagnosis not present

## 2023-10-19 DIAGNOSIS — M545 Low back pain, unspecified: Secondary | ICD-10-CM | POA: Diagnosis not present

## 2023-10-19 DIAGNOSIS — Z9181 History of falling: Secondary | ICD-10-CM | POA: Diagnosis not present

## 2023-10-19 DIAGNOSIS — M6281 Muscle weakness (generalized): Secondary | ICD-10-CM | POA: Diagnosis not present

## 2023-10-25 DIAGNOSIS — R2681 Unsteadiness on feet: Secondary | ICD-10-CM | POA: Diagnosis not present

## 2023-10-25 DIAGNOSIS — M6281 Muscle weakness (generalized): Secondary | ICD-10-CM | POA: Diagnosis not present

## 2023-10-25 DIAGNOSIS — Z9181 History of falling: Secondary | ICD-10-CM | POA: Diagnosis not present

## 2023-10-25 DIAGNOSIS — M545 Low back pain, unspecified: Secondary | ICD-10-CM | POA: Diagnosis not present

## 2023-10-26 DIAGNOSIS — R2681 Unsteadiness on feet: Secondary | ICD-10-CM | POA: Diagnosis not present

## 2023-10-26 DIAGNOSIS — Z9181 History of falling: Secondary | ICD-10-CM | POA: Diagnosis not present

## 2023-10-26 DIAGNOSIS — M6281 Muscle weakness (generalized): Secondary | ICD-10-CM | POA: Diagnosis not present

## 2023-10-26 DIAGNOSIS — M545 Low back pain, unspecified: Secondary | ICD-10-CM | POA: Diagnosis not present

## 2023-11-01 DIAGNOSIS — R2681 Unsteadiness on feet: Secondary | ICD-10-CM | POA: Diagnosis not present

## 2023-11-01 DIAGNOSIS — M6281 Muscle weakness (generalized): Secondary | ICD-10-CM | POA: Diagnosis not present

## 2023-11-01 DIAGNOSIS — Z9181 History of falling: Secondary | ICD-10-CM | POA: Diagnosis not present

## 2023-11-01 DIAGNOSIS — M545 Low back pain, unspecified: Secondary | ICD-10-CM | POA: Diagnosis not present

## 2023-11-01 DIAGNOSIS — G459 Transient cerebral ischemic attack, unspecified: Secondary | ICD-10-CM | POA: Diagnosis not present

## 2023-11-02 ENCOUNTER — Ambulatory Visit (INDEPENDENT_AMBULATORY_CARE_PROVIDER_SITE_OTHER): Payer: PPO | Admitting: Podiatry

## 2023-11-02 ENCOUNTER — Encounter: Payer: Self-pay | Admitting: Podiatry

## 2023-11-02 DIAGNOSIS — B351 Tinea unguium: Secondary | ICD-10-CM

## 2023-11-02 DIAGNOSIS — L608 Other nail disorders: Secondary | ICD-10-CM

## 2023-11-02 DIAGNOSIS — M79676 Pain in unspecified toe(s): Secondary | ICD-10-CM | POA: Diagnosis not present

## 2023-11-02 DIAGNOSIS — E119 Type 2 diabetes mellitus without complications: Secondary | ICD-10-CM

## 2023-11-02 NOTE — Progress Notes (Signed)
This patient returns to my office for at risk foot care.  This patient requires this care by a professional since this patient will be at risk due to having diabetes.  This patient is unable to cut nails herself since the patient cannot reach her nails.These nails are painful walking and wearing shoes.  This patient presents for at risk foot care today.  General Appearance  Alert, conversant and in no acute stress.  Vascular  Dorsalis pedis and posterior tibial  pulses are palpable  bilaterally.  Capillary return is within normal limits  bilaterally. Temperature is within normal limits  bilaterally.  Neurologic  Senn-Weinstein monofilament wire test within normal limits  bilaterally. Muscle power within normal limits bilaterally.  Nails Thick disfigured discolored nails with subungual debris  from hallux to fifth toes bilaterally. No evidence of bacterial infection or drainage bilaterally.  Orthopedic  No limitations of motion  feet .  No crepitus or effusions noted.  No bony pathology or digital deformities noted.  Skin  normotropic skin with no porokeratosis noted bilaterally.  No signs of infections or ulcers noted.     Onychomycosis  Pain in right toes  Pain in left toes  Consent was obtained for treatment procedures.   Mechanical debridement of nails 1-5  bilaterally performed with a nail nipper.  Filed with dremel without incident.    Return office visit     10 weeks                 Told patient to return for periodic foot care and evaluation due to potential at risk complications.   Lennyn Bellanca DPM   

## 2023-11-04 ENCOUNTER — Encounter (HOSPITAL_COMMUNITY): Payer: Self-pay | Admitting: Neurology

## 2023-11-04 ENCOUNTER — Other Ambulatory Visit: Payer: Self-pay

## 2023-11-04 ENCOUNTER — Inpatient Hospital Stay (HOSPITAL_COMMUNITY): Payer: PPO

## 2023-11-04 ENCOUNTER — Emergency Department (HOSPITAL_COMMUNITY): Payer: PPO

## 2023-11-04 ENCOUNTER — Inpatient Hospital Stay (HOSPITAL_COMMUNITY)
Admission: EM | Admit: 2023-11-04 | Discharge: 2023-11-07 | DRG: 062 | Disposition: A | Discharge status: Home / Self Care | Payer: PPO | Source: Ambulatory Visit | Attending: Neurology | Admitting: Neurology

## 2023-11-04 DIAGNOSIS — G8194 Hemiplegia, unspecified affecting left nondominant side: Secondary | ICD-10-CM | POA: Diagnosis not present

## 2023-11-04 DIAGNOSIS — Z91014 Allergy to mammalian meats: Secondary | ICD-10-CM

## 2023-11-04 DIAGNOSIS — F325 Major depressive disorder, single episode, in full remission: Secondary | ICD-10-CM | POA: Diagnosis present

## 2023-11-04 DIAGNOSIS — E1129 Type 2 diabetes mellitus with other diabetic kidney complication: Secondary | ICD-10-CM | POA: Diagnosis present

## 2023-11-04 DIAGNOSIS — R4182 Altered mental status, unspecified: Secondary | ICD-10-CM | POA: Diagnosis not present

## 2023-11-04 DIAGNOSIS — E7800 Pure hypercholesterolemia, unspecified: Secondary | ICD-10-CM | POA: Diagnosis present

## 2023-11-04 DIAGNOSIS — Z8249 Family history of ischemic heart disease and other diseases of the circulatory system: Secondary | ICD-10-CM

## 2023-11-04 DIAGNOSIS — I6381 Other cerebral infarction due to occlusion or stenosis of small artery: Secondary | ICD-10-CM | POA: Diagnosis present

## 2023-11-04 DIAGNOSIS — I6611 Occlusion and stenosis of right anterior cerebral artery: Secondary | ICD-10-CM | POA: Diagnosis not present

## 2023-11-04 DIAGNOSIS — G460 Middle cerebral artery syndrome: Secondary | ICD-10-CM | POA: Diagnosis not present

## 2023-11-04 DIAGNOSIS — R4701 Aphasia: Secondary | ICD-10-CM | POA: Diagnosis not present

## 2023-11-04 DIAGNOSIS — Z87891 Personal history of nicotine dependence: Secondary | ICD-10-CM

## 2023-11-04 DIAGNOSIS — I10 Essential (primary) hypertension: Secondary | ICD-10-CM | POA: Diagnosis not present

## 2023-11-04 DIAGNOSIS — I6389 Other cerebral infarction: Secondary | ICD-10-CM | POA: Diagnosis not present

## 2023-11-04 DIAGNOSIS — I6521 Occlusion and stenosis of right carotid artery: Secondary | ICD-10-CM | POA: Diagnosis not present

## 2023-11-04 DIAGNOSIS — R29810 Facial weakness: Secondary | ICD-10-CM | POA: Diagnosis present

## 2023-11-04 DIAGNOSIS — E538 Deficiency of other specified B group vitamins: Secondary | ICD-10-CM | POA: Diagnosis not present

## 2023-11-04 DIAGNOSIS — G9349 Other encephalopathy: Secondary | ICD-10-CM | POA: Diagnosis present

## 2023-11-04 DIAGNOSIS — R29720 NIHSS score 20: Secondary | ICD-10-CM | POA: Diagnosis present

## 2023-11-04 DIAGNOSIS — E1165 Type 2 diabetes mellitus with hyperglycemia: Secondary | ICD-10-CM | POA: Diagnosis not present

## 2023-11-04 DIAGNOSIS — E119 Type 2 diabetes mellitus without complications: Secondary | ICD-10-CM | POA: Diagnosis not present

## 2023-11-04 DIAGNOSIS — I6782 Cerebral ischemia: Secondary | ICD-10-CM | POA: Diagnosis not present

## 2023-11-04 DIAGNOSIS — E871 Hypo-osmolality and hyponatremia: Secondary | ICD-10-CM | POA: Diagnosis not present

## 2023-11-04 DIAGNOSIS — Z7984 Long term (current) use of oral hypoglycemic drugs: Secondary | ICD-10-CM

## 2023-11-04 DIAGNOSIS — R471 Dysarthria and anarthria: Secondary | ICD-10-CM | POA: Diagnosis present

## 2023-11-04 DIAGNOSIS — Z882 Allergy status to sulfonamides status: Secondary | ICD-10-CM

## 2023-11-04 DIAGNOSIS — E876 Hypokalemia: Secondary | ICD-10-CM | POA: Diagnosis not present

## 2023-11-04 DIAGNOSIS — N179 Acute kidney failure, unspecified: Secondary | ICD-10-CM | POA: Diagnosis present

## 2023-11-04 DIAGNOSIS — H518 Other specified disorders of binocular movement: Secondary | ICD-10-CM | POA: Diagnosis present

## 2023-11-04 DIAGNOSIS — E11319 Type 2 diabetes mellitus with unspecified diabetic retinopathy without macular edema: Secondary | ICD-10-CM | POA: Diagnosis not present

## 2023-11-04 DIAGNOSIS — D72829 Elevated white blood cell count, unspecified: Secondary | ICD-10-CM | POA: Diagnosis not present

## 2023-11-04 DIAGNOSIS — I639 Cerebral infarction, unspecified: Secondary | ICD-10-CM | POA: Diagnosis not present

## 2023-11-04 DIAGNOSIS — Z90710 Acquired absence of both cervix and uterus: Secondary | ICD-10-CM

## 2023-11-04 DIAGNOSIS — R1310 Dysphagia, unspecified: Secondary | ICD-10-CM | POA: Diagnosis present

## 2023-11-04 DIAGNOSIS — R569 Unspecified convulsions: Secondary | ICD-10-CM | POA: Diagnosis not present

## 2023-11-04 DIAGNOSIS — F0394 Unspecified dementia, unspecified severity, with anxiety: Secondary | ICD-10-CM | POA: Diagnosis not present

## 2023-11-04 DIAGNOSIS — Z8673 Personal history of transient ischemic attack (TIA), and cerebral infarction without residual deficits: Secondary | ICD-10-CM | POA: Diagnosis not present

## 2023-11-04 DIAGNOSIS — Z886 Allergy status to analgesic agent status: Secondary | ICD-10-CM

## 2023-11-04 DIAGNOSIS — R410 Disorientation, unspecified: Secondary | ICD-10-CM | POA: Diagnosis not present

## 2023-11-04 DIAGNOSIS — M8580 Other specified disorders of bone density and structure, unspecified site: Secondary | ICD-10-CM | POA: Diagnosis present

## 2023-11-04 DIAGNOSIS — I6503 Occlusion and stenosis of bilateral vertebral arteries: Secondary | ICD-10-CM | POA: Diagnosis not present

## 2023-11-04 DIAGNOSIS — Z79899 Other long term (current) drug therapy: Secondary | ICD-10-CM

## 2023-11-04 DIAGNOSIS — Z888 Allergy status to other drugs, medicaments and biological substances status: Secondary | ICD-10-CM

## 2023-11-04 DIAGNOSIS — G459 Transient cerebral ischemic attack, unspecified: Secondary | ICD-10-CM | POA: Diagnosis not present

## 2023-11-04 HISTORY — DX: Other cerebral infarction due to occlusion or stenosis of small artery: I63.81

## 2023-11-04 LAB — COMPREHENSIVE METABOLIC PANEL
ALT: 17 U/L (ref 0–44)
AST: 24 U/L (ref 15–41)
Albumin: 4.1 g/dL (ref 3.5–5.0)
Alkaline Phosphatase: 47 U/L (ref 38–126)
Anion gap: 12 (ref 5–15)
BUN: 27 mg/dL — ABNORMAL HIGH (ref 8–23)
CO2: 22 mmol/L (ref 22–32)
Calcium: 9.3 mg/dL (ref 8.9–10.3)
Chloride: 97 mmol/L — ABNORMAL LOW (ref 98–111)
Creatinine, Ser: 1.15 mg/dL — ABNORMAL HIGH (ref 0.44–1.00)
GFR, Estimated: 49 mL/min — ABNORMAL LOW (ref >60)
Glucose, Bld: 140 mg/dL — ABNORMAL HIGH (ref 70–99)
Potassium: 3.8 mmol/L (ref 3.5–5.1)
Sodium: 131 mmol/L — ABNORMAL LOW (ref 135–145)
Total Bilirubin: 0.7 mg/dL (ref <1.2)
Total Protein: 7 g/dL (ref 6.5–8.1)

## 2023-11-04 LAB — I-STAT CHEM 8, ED
BUN: 25 mg/dL — ABNORMAL HIGH (ref 8–23)
Calcium, Ion: 1.06 mmol/L — ABNORMAL LOW (ref 1.15–1.40)
Chloride: 97 mmol/L — ABNORMAL LOW (ref 98–111)
Creatinine, Ser: 1.3 mg/dL — ABNORMAL HIGH (ref 0.44–1.00)
Glucose, Bld: 142 mg/dL — ABNORMAL HIGH (ref 70–99)
HCT: 33 % — ABNORMAL LOW (ref 36.0–46.0)
Hemoglobin: 11.2 g/dL — ABNORMAL LOW (ref 12.0–15.0)
Potassium: 3.8 mmol/L (ref 3.5–5.1)
Sodium: 132 mmol/L — ABNORMAL LOW (ref 135–145)
TCO2: 23 mmol/L (ref 22–32)

## 2023-11-04 LAB — HEMOGLOBIN A1C
Hgb A1c MFr Bld: 6.2 % — ABNORMAL HIGH (ref 4.8–5.6)
Mean Plasma Glucose: 131.24 mg/dL

## 2023-11-04 LAB — MAGNESIUM: Magnesium: 1.7 mg/dL (ref 1.7–2.4)

## 2023-11-04 LAB — DIFFERENTIAL
Abs Immature Granulocytes: 0.03 10*3/uL (ref 0.00–0.07)
Basophils Absolute: 0 10*3/uL (ref 0.0–0.1)
Basophils Relative: 1 %
Eosinophils Absolute: 0.1 10*3/uL (ref 0.0–0.5)
Eosinophils Relative: 1 %
Immature Granulocytes: 0 %
Lymphocytes Relative: 13 %
Lymphs Abs: 1.1 10*3/uL (ref 0.7–4.0)
Monocytes Absolute: 0.6 10*3/uL (ref 0.1–1.0)
Monocytes Relative: 7 %
Neutro Abs: 6.3 10*3/uL (ref 1.7–7.7)
Neutrophils Relative %: 78 %

## 2023-11-04 LAB — URINALYSIS, ROUTINE W REFLEX MICROSCOPIC
Bacteria, UA: NONE SEEN
Bilirubin Urine: NEGATIVE
Glucose, UA: 50 mg/dL — AB
Hgb urine dipstick: NEGATIVE
Ketones, ur: NEGATIVE mg/dL
Leukocytes,Ua: NEGATIVE
Nitrite: NEGATIVE
Protein, ur: 30 mg/dL — AB
Specific Gravity, Urine: 1.024 (ref 1.005–1.030)
pH: 6 (ref 5.0–8.0)

## 2023-11-04 LAB — CBC
HCT: 32.6 % — ABNORMAL LOW (ref 36.0–46.0)
Hemoglobin: 11.2 g/dL — ABNORMAL LOW (ref 12.0–15.0)
MCH: 29.7 pg (ref 26.0–34.0)
MCHC: 34.4 g/dL (ref 30.0–36.0)
MCV: 86.5 fL (ref 80.0–100.0)
Platelets: 215 10*3/uL (ref 150–400)
RBC: 3.77 MIL/uL — ABNORMAL LOW (ref 3.87–5.11)
RDW: 12.8 % (ref 11.5–15.5)
WBC: 8.2 10*3/uL (ref 4.0–10.5)
nRBC: 0 % (ref 0.0–0.2)

## 2023-11-04 LAB — APTT: aPTT: 32 s (ref 24–36)

## 2023-11-04 LAB — RAPID URINE DRUG SCREEN, HOSP PERFORMED
Amphetamines: NOT DETECTED
Barbiturates: NOT DETECTED
Benzodiazepines: NOT DETECTED
Cocaine: NOT DETECTED
Opiates: NOT DETECTED
Tetrahydrocannabinol: NOT DETECTED

## 2023-11-04 LAB — PROTIME-INR
INR: 1.1 (ref 0.8–1.2)
Prothrombin Time: 14.1 s (ref 11.4–15.2)

## 2023-11-04 LAB — ETHANOL: Alcohol, Ethyl (B): <10 mg/dL (ref <10)

## 2023-11-04 LAB — GLUCOSE, CAPILLARY: Glucose-Capillary: 235 mg/dL — ABNORMAL HIGH (ref 70–99)

## 2023-11-04 LAB — CBG MONITORING, ED
Glucose-Capillary: 143 mg/dL — ABNORMAL HIGH (ref 70–99)
Glucose-Capillary: 227 mg/dL — ABNORMAL HIGH (ref 70–99)

## 2023-11-04 LAB — TROPONIN I (HIGH SENSITIVITY): Troponin I (High Sensitivity): 13 ng/L (ref <18)

## 2023-11-04 MED ORDER — VANCOMYCIN HCL 750 MG/150ML IV SOLN
750.0000 mg | INTRAVENOUS | Status: DC
  Administered 2023-11-05: 750 mg via INTRAVENOUS
  Filled 2023-11-04: qty 150

## 2023-11-04 MED ORDER — CYANOCOBALAMIN 1000 MCG/ML IJ SOLN
1000.0000 ug | Freq: Every day | INTRAMUSCULAR | Status: DC
  Administered 2023-11-05 – 2023-11-06 (×2): 1000 ug via INTRAMUSCULAR
  Filled 2023-11-04 (×3): qty 1

## 2023-11-04 MED ORDER — SENNOSIDES-DOCUSATE SODIUM 8.6-50 MG PO TABS: 1.0000 | ORAL_TABLET | Freq: Every evening | ORAL | Status: DC | PRN

## 2023-11-04 MED ORDER — INSULIN ASPART 100 UNIT/ML IJ SOLN
0.0000 [IU] | INTRAMUSCULAR | Status: DC
  Administered 2023-11-04 (×2): 2 [IU] via SUBCUTANEOUS
  Administered 2023-11-05 (×2): 1 [IU] via SUBCUTANEOUS
  Administered 2023-11-05 (×2): 2 [IU] via SUBCUTANEOUS
  Administered 2023-11-06 (×2): 1 [IU] via SUBCUTANEOUS
  Administered 2023-11-07: 3 [IU] via SUBCUTANEOUS

## 2023-11-04 MED ORDER — LABETALOL HCL 5 MG/ML IV SOLN
10.0000 mg | Freq: Once | INTRAVENOUS | Status: AC
  Administered 2023-11-04: 10 mg via INTRAVENOUS

## 2023-11-04 MED ORDER — SODIUM CHLORIDE 0.9 % IV SOLN: INTRAVENOUS | Status: DC

## 2023-11-04 MED ORDER — SODIUM CHLORIDE 0.9 % IV SOLN
2.0000 g | Freq: Two times a day (BID) | INTRAVENOUS | Status: DC
  Administered 2023-11-05 (×3): 2 g via INTRAVENOUS
  Filled 2023-11-04 (×3): qty 20

## 2023-11-04 MED ORDER — TENECTEPLASE FOR STROKE
0.2500 mg/kg | PACK | Freq: Once | INTRAVENOUS | Status: AC
  Administered 2023-11-04: 15 mg via INTRAVENOUS
  Filled 2023-11-04: qty 10

## 2023-11-04 MED ORDER — METOPROLOL TARTRATE 5 MG/5ML IV SOLN
2.5000 mg | Freq: Once | INTRAVENOUS | Status: AC
  Administered 2023-11-04: 2.5 mg via INTRAVENOUS
  Filled 2023-11-04: qty 5

## 2023-11-04 MED ORDER — SODIUM CHLORIDE 0.9 % IV SOLN
2.0000 g | Freq: Four times a day (QID) | INTRAVENOUS | Status: DC
  Administered 2023-11-05 – 2023-11-06 (×6): 2 g via INTRAVENOUS
  Filled 2023-11-04 (×7): qty 2000

## 2023-11-04 MED ORDER — VANCOMYCIN HCL 1250 MG/250ML IV SOLN
1250.0000 mg | Freq: Once | INTRAVENOUS | Status: AC
  Administered 2023-11-05: 1250 mg via INTRAVENOUS
  Filled 2023-11-04: qty 250

## 2023-11-04 MED ORDER — CLEVIDIPINE BUTYRATE 0.5 MG/ML IV EMUL
0.0000 mg/h | INTRAVENOUS | Status: DC
Start: 2023-11-04 — End: 2023-11-06
  Administered 2023-11-04 (×2): 2 mg/h via INTRAVENOUS
  Administered 2023-11-05: 11 mg/h via INTRAVENOUS
  Administered 2023-11-05: 19 mg/h via INTRAVENOUS
  Administered 2023-11-05: 11 mg/h via INTRAVENOUS
  Administered 2023-11-05: 9 mg/h via INTRAVENOUS
  Administered 2023-11-05: 13 mg/h via INTRAVENOUS
  Filled 2023-11-04 (×9): qty 100

## 2023-11-04 MED ORDER — SODIUM CHLORIDE 0.9 % IV SOLN
INTRAVENOUS | Status: AC
Start: 2023-11-04 — End: 2023-11-04

## 2023-11-04 MED ORDER — ACETAMINOPHEN 650 MG RE SUPP: 650.0000 mg | RECTAL | Status: DC | PRN

## 2023-11-04 MED ORDER — LABETALOL HCL 5 MG/ML IV SOLN
10.0000 mg | Freq: Once | INTRAVENOUS | Status: AC
Start: 2023-11-04 — End: 2023-11-04
  Administered 2023-11-04: 10 mg via INTRAVENOUS

## 2023-11-04 MED ORDER — LEVETIRACETAM IN NACL 1500 MG/100ML IV SOLN: 1500.0000 mg | Freq: Once | INTRAVENOUS | Status: DC

## 2023-11-04 MED ORDER — ACETAMINOPHEN 160 MG/5ML PO SOLN: 650.0000 mg | ORAL | Status: DC | PRN

## 2023-11-04 MED ORDER — IOHEXOL 350 MG/ML SOLN
75.0000 mL | Freq: Once | INTRAVENOUS | Status: AC | PRN
  Administered 2023-11-04: 75 mL via INTRAVENOUS

## 2023-11-04 MED ORDER — PANTOPRAZOLE SODIUM 40 MG IV SOLR
40.0000 mg | Freq: Every day | INTRAVENOUS | Status: DC
  Administered 2023-11-04 – 2023-11-05 (×2): 40 mg via INTRAVENOUS
  Filled 2023-11-04 (×2): qty 10

## 2023-11-04 MED ORDER — ACETAMINOPHEN 325 MG PO TABS: 650.0000 mg | ORAL_TABLET | ORAL | Status: DC | PRN

## 2023-11-04 MED ORDER — METOCLOPRAMIDE HCL 5 MG/ML IJ SOLN
10.0000 mg | Freq: Once | INTRAMUSCULAR | Status: AC
  Administered 2023-11-04: 10 mg via INTRAVENOUS
  Filled 2023-11-04: qty 2

## 2023-11-04 MED ORDER — CYANOCOBALAMIN 1000 MCG/ML IJ SOLN
1000.0000 ug | INTRAMUSCULAR | Status: DC
Start: 2023-11-11 — End: 2023-11-06

## 2023-11-04 MED ORDER — CHLORHEXIDINE GLUCONATE CLOTH 2 % EX PADS
6.0000 | MEDICATED_PAD | Freq: Every day | CUTANEOUS | Status: DC
  Administered 2023-11-04: 6 via TOPICAL

## 2023-11-04 MED ORDER — VITAMIN B-12 1000 MCG PO TABS: 1000.0000 ug | ORAL_TABLET | Freq: Every day | ORAL | Status: DC

## 2023-11-04 MED ORDER — ORAL CARE MOUTH RINSE: 15.0000 mL | OROMUCOSAL | Status: DC | PRN

## 2023-11-04 MED ORDER — STROKE: EARLY STAGES OF RECOVERY BOOK
Freq: Once | Status: AC
  Filled 2023-11-04: qty 1

## 2023-11-04 MED ORDER — LABETALOL HCL 5 MG/ML IV SOLN
10.0000 mg | INTRAVENOUS | Status: DC | PRN
  Administered 2023-11-06: 10 mg via INTRAVENOUS
  Filled 2023-11-04 (×2): qty 4

## 2023-11-04 MED ORDER — DEXTROSE 5 % IV SOLN
500.0000 mg | Freq: Two times a day (BID) | INTRAVENOUS | Status: DC
  Administered 2023-11-05 – 2023-11-06 (×4): 500 mg via INTRAVENOUS
  Filled 2023-11-04 (×5): qty 10

## 2023-11-04 NOTE — Progress Notes (Signed)
Head marked in its entirety, pt squirming during hookup. Bad access to coronal leads.

## 2023-11-04 NOTE — ED Notes (Signed)
From CT to ED exam room 27

## 2023-11-04 NOTE — Progress Notes (Signed)
Brief Neuro Update:  EEG reviewed and notable for slowing, no seizures. Spoke with patient's son who is a Solicitor and updated him.  Later notified by RN that patient has developed fever with Temp of 102.5. Will start empiric meningitis coverage with Vanc, ceftriaxone, ampicillin and acyclovir.  Erick Blinks [facility]

## 2023-11-04 NOTE — Progress Notes (Signed)
PHARMACIST CODE STROKE RESPONSE  Notified to mix TNK at 16:12 by Dr. Iver Nestle TNK preparation completed at 16:15  TNK dose = 15 mg IV over 5 seconds.   Issues/delays encountered (if applicable): BP >185/110 - gave labetalol 10mg  IV x1, repeated labetalol 10mg  IV x1 and started cleviprex gtt. Once BP < 185/110, TNK 15mg  IV x1 was given at 16:22. Cleviprex was continued to maintain BP goal of <180/105.   Wilburn Cornelia, PharmD, BCPS Clinical Pharmacist 11/04/2023 4:36 PM   Please refer to [facility] for pharmacy phone number

## 2023-11-04 NOTE — Progress Notes (Signed)
Order changed to LTM by dayshift neuro, and eeg postponed until order is verified by night shift neuro.

## 2023-11-04 NOTE — ED Provider Notes (Signed)
[place] EMERGENCY DEPARTMENT AT [facility] Provider Note   CSN: [id number] Arrival date & time: 11/04/23  1555     History  Chief Complaint  Patient presents with   Code Stroke    Cindy Rhodes is a 77 y.o. female.   Pt is a 77y/o female with hx of hypertension, diabetes mellitus type 2, hypoosmolar hyponatremia who is presenting today as a code stroke.  EMS gives the history because patient is unable to provide history.  Patient was going to her routine doctor's exam with her husband and he noted in the car around 130 she suddenly began not acting correctly.  She was having difficulty talking, seemed confused and was not able to follow commands.  Upon arrival to the doctor's office they called 911.  EMS noted the patient was hypertensive with blood sugar greater than 100.  Patient seemed to have left-sided weakness, neglect, aphasia  The history is provided by the EMS personnel and the spouse (pcp).       Home Medications Prior to Admission medications   Medication Sig Start Date End Date Taking? Authorizing Provider  amLODipine (NORVASC) 5 MG tablet Take 1 tablet (5 mg total) by mouth daily. 03/28/23 07/09/23  Meredeth Ide, MD  Ascorbic Acid (VITAMIN C) 500 MG CAPS Take 500 mg by mouth daily.    [provider]  Calcium Carbonate-Vitamin D 600-400 MG-UNIT tablet Take 1 tablet by mouth daily.    [provider]  Coenzyme Q10 (CO Q 10 PO) Take 1 capsule by mouth daily.    [provider]  CRESTOR 5 MG tablet Take 2.5 mg by mouth at bedtime.  10/06/13   [provider]  famotidine (PEPCID) 20 MG tablet Take 20 mg by mouth daily.    [provider]  glucosamine-chondroitin 500-400 MG tablet Take 3 tablets by mouth daily.     [provider]  memantine (NAMENDA) 5 MG tablet TAKE 1 TABLET(5 MG) BY MOUTH EVERY NIGHT FOR 2 WEEKS, THEN INCREASE TO TAKE 1 TABLET (5 MG) BY MOUTH 2 TIMES A DAY 08/17/23   Gwynneth Munson, Sung Amabile, PA-C   metFORMIN (GLUCOPHAGE-XR) 500 MG 24 hr tablet Take 1,000 mg by mouth 2 (two) times daily. 07/01/19   [provider]  metoprolol tartrate (LOPRESSOR) 25 MG tablet Take 25 mg by mouth 2 (two) times daily.  09/30/13   [provider]  Misc Natural Products (TART CHERRY ADVANCED) CAPS Take 1 capsule by mouth daily.    [provider]  Multiple Vitamin (MULTIVITAMIN WITH MINERALS) TABS tablet Take 1 tablet by mouth daily.    [provider]  oxybutynin (DITROPAN-XL) 10 MG 24 hr tablet Take 10 mg by mouth daily.  12/28/18   [provider]  sertraline (ZOLOFT) 50 MG tablet Take 50 mg by mouth daily. 07/22/13   [provider]  spironolactone (ALDACTONE) 25 MG tablet Take 0.5 tablets (12.5 mg total) by mouth daily. 03/29/23   Meredeth Ide, MD  telmisartan (MICARDIS) 80 MG tablet Take 1 tablet (80 mg total) by mouth daily. 08/18/19   Mancel Bale, MD  telmisartan-hydrochlorothiazide (MICARDIS HCT) 80-25 MG tablet Take 1 tablet by mouth daily.  07/05/18 08/18/19  [provider]      Allergies    Hydrochlorothiazide, Meloxicam, Pork-derived products, and Sulfa antibiotics    Review of Systems   Review of Systems  Physical Exam Updated Vital Signs BP (!) 143/66   Ht 5\' 1"  (1.549 m)  Wt 60.6 kg   SpO2 98%   BMI 25.24 kg/m  Physical Exam Vitals and nursing note reviewed.  Constitutional:      General: She is not in acute distress.    Appearance: She is well-developed.     Comments: Slightly sleepy but will open eyes  HENT:     Head: Normocephalic and atraumatic.  Eyes:     Pupils: Pupils are equal, round, and reactive to light.  Cardiovascular:     Rate and Rhythm: Normal rate and regular rhythm.     Heart sounds: Normal heart sounds. No murmur heard.    No friction rub.  Pulmonary:     Effort: Pulmonary effort is normal.     Breath sounds: Normal breath sounds. No wheezing or rales.  Abdominal:     General: Bowel sounds are  normal. There is no distension.     Palpations: Abdomen is soft.     Tenderness: There is no abdominal tenderness. There is no guarding or rebound.  Musculoskeletal:        General: No tenderness. Normal range of motion.     Right lower leg: No edema.     Left lower leg: No edema.     Comments: No edema  Skin:    General: Skin is warm and dry.     Findings: No rash.  Neurological:     Cranial Nerves: Dysarthria present.     Comments: Left-sided hemineglect, 1 out of 5 strength in the left upper extremity.  Unable to get patient to cooperate to evaluate if there is normal motor strength in the lower extremities.  5 out of 5 strength in the right upper extremity.  Slight left-sided facial droop.  Pupils are sluggish but reactive bilaterally.  Patient is unable to follow commands.  Psychiatric:        Behavior: Behavior normal.     ED Results / Procedures / Treatments   Labs (all labs ordered are listed, but only abnormal results are displayed) Labs Reviewed  CBC - Abnormal; Notable for the following components:      Result Value   RBC 3.77 (*)    Hemoglobin 11.2 (*)    HCT 32.6 (*)    All other components within normal limits  I-STAT CHEM 8, ED - Abnormal; Notable for the following components:   Sodium 132 (*)    Chloride 97 (*)    BUN 25 (*)    Creatinine, Ser 1.30 (*)    Glucose, Bld 142 (*)    Calcium, Ion 1.06 (*)    Hemoglobin 11.2 (*)    HCT 33.0 (*)    All other components within normal limits  CBG MONITORING, ED - Abnormal; Notable for the following components:   Glucose-Capillary 143 (*)    All other components within normal limits  PROTIME-INR  APTT  DIFFERENTIAL  ETHANOL  COMPREHENSIVE METABOLIC PANEL  RAPID URINE DRUG SCREEN, HOSP PERFORMED  URINALYSIS, ROUTINE W REFLEX MICROSCOPIC  HEMOGLOBIN A1C    EKG None  Radiology CT HEAD CODE STROKE WO CONTRAST  Result Date: 11/04/2023 CLINICAL DATA:  Code stroke. Provided history: Neuro deficit, acute,  stroke suspected. EXAM: CT HEAD WITHOUT CONTRAST TECHNIQUE: Contiguous axial images were obtained from the base of the skull through the vertex without intravenous contrast. RADIATION DOSE REDUCTION: This exam was performed according to the departmental dose-optimization program which includes automated exposure control, adjustment of the mA and/or kV according to patient size and/or use of iterative reconstruction technique. COMPARISON:  Brain MRI 03/18/2023.  Head CT 03/18/2023. FINDINGS: Brain: Generalized cerebral atrophy. Patchy and ill-defined hypoattenuation within the cerebral white matter, nonspecific but compatible with moderate-to-severe chronic small vessel ischemic disease. There is no acute intracranial hemorrhage. No demarcated cortical infarct. No extra-axial fluid collection. No evidence of an intracranial mass. No midline shift. Vascular: No hyperdense vessel.  Atherosclerotic calcifications. Skull: No calvarial fracture or aggressive osseous lesion. Sinuses/Orbits: No mass or acute finding within the imaged orbits. No significant paranasal sinus disease. ASPECTS [facility] Stroke Program Early CT Score) - Ganglionic level infarction (caudate, lentiform nuclei, internal capsule, insula, M1-M3 cortex): 7 - Supraganglionic infarction (M4-M6 cortex): 3 Total score (0-10 with 10 being normal): 10 No evidence of an acute intracranial abnormality. These results were communicated to Bhagat at 4:15 pmon 12/5/2024by text page via the [facility] messaging system. IMPRESSION: 1. No evidence of an acute intracranial abnormality. 2. Parenchymal atrophy and chronic small vessel ischemic disease. Electronically Signed   By: Jackey Loge D.O.   On: 11/04/2023 16:17    Procedures Procedures    Medications Ordered in ED Medications  tenecteplase (TNKASE) injection for Stroke 15 mg (has no administration in time range)  labetalol (NORMODYNE) injection 10 mg (has no administration in time range)  labetalol  (NORMODYNE) injection 10 mg (has no administration in time range)  clevidipine (CLEVIPREX) infusion 0.5 mg/mL (has no administration in time range)   stroke: early stages of recovery book (has no administration in time range)  0.9 %  sodium chloride infusion (has no administration in time range)  acetaminophen (TYLENOL) tablet 650 mg (has no administration in time range)    Or  acetaminophen (TYLENOL) 160 MG/5ML solution 650 mg (has no administration in time range)    Or  acetaminophen (TYLENOL) suppository 650 mg (has no administration in time range)  senna-docusate (Senokot-S) tablet 1 tablet (has no administration in time range)  pantoprazole (PROTONIX) injection 40 mg (has no administration in time range)  iohexol (OMNIPAQUE) 350 MG/ML injection 75 mL (75 mLs Intravenous Contrast Given 11/04/23 1623)    ED Course/ Medical Decision Making/ A&P                                 Medical Decision Making Amount and/or Complexity of Data Reviewed Independent Historian: EMS External Data Reviewed: notes. Labs: ordered. Decision-making details documented in ED Course. Radiology: ordered and independent interpretation performed. Decision-making details documented in ED Course. ECG/medicine tests: ordered and independent interpretation performed. Decision-making details documented in ED Course.  Risk Decision regarding hospitalization.   Pt with multiple medical problems and comorbidities and presenting today with a complaint that caries a high risk for morbidity and mortality.  Here today as a code stroke.  Patient last seen normal at 130 today.  She has LVO positive on exam with high NIH score.  Neurology present at bedside upon patient's arrival.  There was no history of ingestion of any medication prior to going to the doctor's office.  Does have a prior history of hyponatremia and altered mental status.  Patient's hypertensive here within normal heart rate and O2 sat.  Airway is intact.   Patient went directly for CT.  No findings or history concerning for infection at this time. I independently interpreted patient's labs and Chem-8 today with a sodium of 132, creatinine of 1.3 and normal potassium, hemoglobin stable at 11.2 and platelet count is normal.  I have independently visualized and interpreted  pt's images today.  Head CT without acute bleed.  Radiology reports no acute findings.  Patient was given labetalol for her elevated blood pressure.  She was given TNKase.  This is all under the direction of stroke team with Dr. Dierdre Harness.  Pt will be admitted to the neuro ICU  CRITICAL CARE Performed by: Ambriana Selway Total critical care time: 30 minutes Critical care time was exclusive of separately billable procedures and treating other patients. Critical care was necessary to treat or prevent imminent or life-threatening deterioration. Critical care was time spent personally by me on the following activities: development of treatment plan with patient and/or surrogate as well as nursing, discussions with consultants, evaluation of patient's response to treatment, examination of patient, obtaining history from patient or surrogate, ordering and performing treatments and interventions, ordering and review of laboratory studies, ordering and review of radiographic studies, pulse oximetry and re-evaluation of patient's condition.            Final Clinical Impression(s) / ED Diagnoses Final diagnoses:  Acute ischemic stroke [facility])    Rx / DC Orders ED Discharge Orders     None         Gwyneth Sprout, MD 11/04/23 [phone]

## 2023-11-04 NOTE — ED Notes (Signed)
ED TO INPATIENT HANDOFF REPORT  ED Nurse Name and Phone #: Darral Dash RN [phone]  S Name/Age/Gender Cindy Rhodes 77 y.o. female Room/Bed: 027C/027C  Code Status   Code Status: Full Code  Home/SNF/Other Home Patient oriented to: self, place, time, and situation Is this baseline? Yes   Triage Complete: Triage complete  Chief Complaint Stroke determined by clinical assessment [facility]) [I63.9]  Triage Note BIB [facility] from [facility] physicians across the street, was coming to PCP for routine check when husband noticed change from baseline at 1330 enroute to PCP. EMS LVO score 3. EMS mentions AMS/ confusion, L side weak. NSL 18g L AC. Arrives to full neuro team and EDP at bridge. Neuro assessment begun. Airway patent. No following commands or verbal. Blood obtained straight to CT.    Allergies Allergies  Allergen Reactions   Hydrochlorothiazide Other (See Comments)    Unknown    Meloxicam Other (See Comments)    Dizzy and nervous    Pork-Derived Products Other (See Comments)    Religious    Sulfa Antibiotics Other (See Comments)    Shuts down urinary system     Level of Care/Admitting Diagnosis ED Disposition     ED Disposition  Admit   Condition  --   Comment  Hospital Area: [facility] [[id number]]  Level of Care: ICU [6]  May admit patient to [facility] or [facility] if equivalent level of care is available:: No  Covid Evaluation: Asymptomatic - no recent exposure (last 10 days) testing not required  Diagnosis: Stroke determined by clinical assessment [facility]) [[id number]]  Admitting Physician: Gordy Councilman [[id number]]  Attending Physician: Gabriel Rung, MD [phone]  Certification:: I certify this patient is being admitted for an inpatient-only procedure  Expected Medical Readiness: 11/06/2023          B Medical/Surgery History Past Medical History:  Diagnosis Date   Acute lower UTI 08/12/2017   Acute metabolic encephalopathy 03/18/2023   Atrophy of vagina  05/14/2021   Cerebrovascular disease    moderate to severe per MRI   DDD (degenerative disc disease), cervical 10/13/2019   Decreased estrogen level 05/14/2021   Diabetic renal disease 05/14/2021   Essential hypertension 08/12/2017   Generalized anxiety disorder 05/14/2021   Hypertensive crisis 03/18/2023   Hypokalemia 08/12/2017   Hypomagnesemia 03/18/2023   Hyponatremia 08/12/2017   Interstitial cystitis 05/14/2021   Leukocytosis 05/14/2021   Major depressive disorder in remission 05/14/2021   Migraine without aura, not refractory 05/14/2021   Mild cognitive impairment of uncertain or unknown etiology 05/27/2023   Neck pain 08/28/2019   Osteopenia 05/14/2021   Pain in right hand 05/08/2019   Pincer nail deformity 01/24/2020   Prolonged QT interval 03/18/2023   Pure hypercholesterolemia 05/14/2021   Spinal stenosis in cervical region 10/13/2019   Subacute vulvitis 05/14/2021   Trigger finger of right hand 05/08/2019   Type 2 diabetes mellitus with hyperglycemia 08/12/2017   Urge incontinence of urine 05/14/2021   Past Surgical History:  Procedure Laterality Date   ABDOMINAL HYSTERECTOMY     HAND SURGERY Left      A IV Location/Drains/Wounds Patient Lines/Drains/Airways Status     Active Line/Drains/Airways     Name Placement date Placement time Site Days   Peripheral IV 11/04/23 16 G 1" Left Antecubital 11/04/23  1555  Antecubital  less than 1   Peripheral IV 11/04/23 18 G 1" Right;Lateral;Anterior Forearm 11/04/23  1615  Forearm  less than 1  Intake/Output Last 24 hours  Intake/Output Summary (Last 24 hours) at 11/04/2023 2156 Last data filed at 11/04/2023 2153 Gross per 24 hour  Intake 95.8 ml  Output 300 ml  Net -204.2 ml    Labs/Imaging Results for orders placed or performed during the hospital encounter of 11/04/23 (from the past 48 hour(s))  CBG monitoring, ED     Status: Abnormal   Collection Time: 11/04/23  3:57 PM  Result Value Ref  Range   Glucose-Capillary 143 (H) 70 - 99 mg/dL    Comment: Glucose reference range applies only to samples taken after fasting for at least 8 hours.  Ethanol     Status: None   Collection Time: 11/04/23  3:58 PM  Result Value Ref Range   Alcohol, Ethyl (B) <10 <10 mg/dL    Comment: (NOTE) Lowest detectable limit for serum alcohol is 10 mg/dL.  For medical purposes only. Performed at [facility], [address], [place], [place] [address]   Protime-INR     Status: None   Collection Time: 11/04/23  3:58 PM  Result Value Ref Range   Prothrombin Time 14.1 11.4 - 15.2 seconds   INR 1.1 0.8 - 1.2    Comment: (NOTE) INR goal varies based on device and disease states. Performed at [facility], [address], [place], [place] [address]   APTT     Status: None   Collection Time: 11/04/23  3:58 PM  Result Value Ref Range   aPTT 32 24 - 36 seconds    Comment: Performed at [facility], [address] [place], [place] [address]  CBC     Status: Abnormal   Collection Time: 11/04/23  3:58 PM  Result Value Ref Range   WBC 8.2 4.0 - 10.5 K/uL   RBC 3.77 (L) 3.87 - 5.11 MIL/uL   Hemoglobin 11.2 (L) 12.0 - 15.0 g/dL   HCT 86.5 (L) 78.4 - 69.6 %   MCV 86.5 80.0 - 100.0 fL   MCH 29.7 26.0 - 34.0 pg   MCHC 34.4 30.0 - 36.0 g/dL   RDW 29.5 28.4 - 13.2 %   Platelets 215 150 - 400 K/uL   nRBC 0.0 0.0 - 0.2 %    Comment: Performed at [facility], [address] [place], [place] [address]  Differential     Status: None   Collection Time: 11/04/23  3:58 PM  Result Value Ref Range   Neutrophils Relative % 78 %   Neutro Abs 6.3 1.7 - 7.7 K/uL   Lymphocytes Relative 13 %   Lymphs Abs 1.1 0.7 - 4.0 K/uL   Monocytes Relative 7 %   Monocytes Absolute 0.6 0.1 - 1.0 K/uL   Eosinophils Relative 1 %   Eosinophils Absolute 0.1 0.0 - 0.5 K/uL   Basophils Relative 1 %   Basophils Absolute 0.0 0.0 - 0.1 K/uL   Immature Granulocytes 0 %   Abs Immature Granulocytes 0.03  0.00 - 0.07 K/uL    Comment: Performed at [facility], [address], [place], [place] [address]  Comprehensive metabolic panel     Status: Abnormal   Collection Time: 11/04/23  3:58 PM  Result Value Ref Range   Sodium 131 (L) 135 - 145 mmol/L   Potassium 3.8 3.5 - 5.1 mmol/L   Chloride 97 (L) 98 - 111 mmol/L   CO2 22 22 - 32 mmol/L   Glucose, Bld 140 (H) 70 - 99 mg/dL  Comment: Glucose reference range applies only to samples taken after fasting for at least 8 hours.   BUN 27 (H) 8 - 23 mg/dL   Creatinine, Ser 9.62 (H) 0.44 - 1.00 mg/dL   Calcium 9.3 8.9 - 95.2 mg/dL   Total Protein 7.0 6.5 - 8.1 g/dL   Albumin 4.1 3.5 - 5.0 g/dL   AST 24 15 - 41 U/L   ALT 17 0 - 44 U/L   Alkaline Phosphatase 47 38 - 126 U/L   Total Bilirubin 0.7 <1.2 mg/dL   GFR, Estimated 49 (L) >60 mL/min    Comment: (NOTE) Calculated using the CKD-EPI Creatinine Equation (2021)    Anion gap 12 5 - 15    Comment: Performed at [facility], [address], [place], [place] [address]  I-stat chem 8, ED     Status: Abnormal   Collection Time: 11/04/23  4:02 PM  Result Value Ref Range   Sodium 132 (L) 135 - 145 mmol/L   Potassium 3.8 3.5 - 5.1 mmol/L   Chloride 97 (L) 98 - 111 mmol/L   BUN 25 (H) 8 - 23 mg/dL   Creatinine, Ser 4.40 (H) 0.44 - 1.00 mg/dL   Glucose, Bld 102 (H) 70 - 99 mg/dL    Comment: Glucose reference range applies only to samples taken after fasting for at least 8 hours.   Calcium, Ion 1.06 (L) 1.15 - 1.40 mmol/L   TCO2 23 22 - 32 mmol/L   Hemoglobin 11.2 (L) 12.0 - 15.0 g/dL   HCT 72.5 (L) 36.6 - 44.0 %  Hemoglobin A1c     Status: Abnormal   Collection Time: 11/04/23  4:33 PM  Result Value Ref Range   Hgb A1c MFr Bld 6.2 (H) 4.8 - 5.6 %    Comment: (NOTE) Pre diabetes:          5.7%-6.4%  Diabetes:              >6.4%  Glycemic control for   <7.0% adults with diabetes    Mean Plasma Glucose 131.24 mg/dL    Comment: Performed at [facility], [address], [place], [place] [address]  Magnesium     Status: None   Collection Time: 11/04/23  6:40 PM  Result Value Ref Range   Magnesium 1.7 1.7 - 2.4 mg/dL    Comment: Performed at [facility], [address], [place], [place] [address]  Troponin I (High Sensitivity)     Status: None   Collection Time: 11/04/23  6:40 PM  Result Value Ref Range   Troponin I (High Sensitivity) 13 <18 ng/L    Comment: (NOTE) Elevated high sensitivity troponin I (hsTnI) values and significant  changes across serial measurements may suggest ACS but many other  chronic and acute conditions are known to elevate hsTnI results.  Refer to the "Links" section for chest pain algorithms and additional  guidance. Performed at [facility], [address], [place], [place] [address]   CBG monitoring, ED     Status: Abnormal   Collection Time: 11/04/23  8:16 PM  Result Value Ref Range   Glucose-Capillary 227 (H) 70 - 99 mg/dL    Comment: Glucose reference range applies only to samples taken after fasting for at least 8 hours.   CT ANGIO HEAD NECK W WO CM (CODE STROKE)  Result Date: 11/04/2023 CLINICAL DATA:  Provided history: Neuro deficit, acute, stroke suspected. EXAM: CT ANGIOGRAPHY HEAD AND NECK  WITH AND WITHOUT CONTRAST TECHNIQUE: Multidetector CT imaging of the head and neck was performed using the standard protocol during bolus administration of intravenous contrast. Multiplanar CT image reconstructions and MIPs were obtained to evaluate the vascular anatomy. Carotid stenosis measurements (when applicable) are obtained utilizing NASCET criteria, using the distal internal carotid diameter as the denominator. RADIATION DOSE REDUCTION: This exam was performed according to the departmental dose-optimization program which includes automated exposure control, adjustment of the mA and/or kV according to patient size and/or use of iterative reconstruction technique. CONTRAST:  75mL OMNIPAQUE IOHEXOL 350 MG/ML SOLN  COMPARISON:  Noncontrast head CT performed earlier today 11/04/2023. FINDINGS: CTA NECK FINDINGS Aortic arch: Standard aortic branching. Atherosclerotic plaque within the visualized thoracic aorta and proximal major branch vessels of the neck. Streak/beam hardening artifact arising from a dense left-sided contrast bolus partially obscures the left subclavian artery. Within this limitation, there is no appreciable hemodynamically significant innominate or proximal subclavian artery stenosis. Right carotid system: CCA and ICA patent within the neck without stenosis. Mild atherosclerotic plaque about the carotid bifurcation. Left carotid system: CCA and ICA patent within the neck without stenosis. Mild atherosclerotic plaque about the carotid bifurcation. Vertebral arteries: Codominant and patent within the neck. Mild stenotic plaque at both vertebral artery origins. Severe narrowing of the V2 left vertebral artery at the C5-C6 level due to degenerative osseous spurring (series 5, image 182). Skeleton: Cervical spondylosis. No acute fracture or aggressive osseous lesion. Other neck: No neck mass or cervical lymphadenopathy. Upper chest: No consolidation within the imaged lungs. Review of the MIP images confirms the above findings CTA HEAD FINDINGS Anterior circulation: The intracranial internal carotid arteries are patent. Nonstenotic atherosclerotic plaque within the intracranial right ICA. The M1 middle cerebral arteries are patent. No M2 proximal branch occlusion or high-grade proximal stenosis. The anterior cerebral arteries are patent. Moderate narrowing of the right anterior cerebral artery proximal A2 segment. No intracranial aneurysm is identified. Posterior circulation: The intracranial vertebral arteries are patent. The basilar artery is patent. The posterior cerebral arteries are patent. Short-segment fenestration within the mid-to-distal basilar artery. The posterior cerebral arteries are patent. The  posterior cerebral arteries are patent. Posterior communicating arteries are diminutive or absent, bilaterally. Venous sinuses: Within the limitations of contrast timing, no convincing thrombus. Anatomic variants: As described. Review of the MIP images confirms the above findings No emergent large vessel occlusion identified. These results were communicated to Dr. Iver Nestle at 4:45 pmon 12/5/2024by text page via the [facility] messaging system. IMPRESSION: CTA neck: 1. The common carotid and internal carotid arteries are patent within the neck without stenosis. Mild atherosclerotic plaque bilaterally, as described. 2. The vertebral arteries are patent within the neck. Non-stenotic atherosclerotic plaque at both vertebral artery origins. Severe narrowing of the V2 left vertebral artery at the C5-C6 level due to degenerative osseous spurring. CTA head: 1. No proximal intracranial large vessel occlusion identified. 2. Moderate stenosis within the right anterior cerebral artery proximal A2 segment. 3. Short-segment fenestration within the mid-to-distal basilar artery (anatomic variant). Electronically Signed   By: Jackey Loge D.O.   On: 11/04/2023 16:46   CT HEAD CODE STROKE WO CONTRAST  Result Date: 11/04/2023 CLINICAL DATA:  Code stroke. Provided history: Neuro deficit, acute, stroke suspected. EXAM: CT HEAD WITHOUT CONTRAST TECHNIQUE: Contiguous axial images were obtained from the base of the skull through the vertex without intravenous contrast. RADIATION DOSE REDUCTION: This exam was performed according to the departmental dose-optimization program which includes automated exposure control, adjustment of the mA  and/or kV according to patient size and/or use of iterative reconstruction technique. COMPARISON:  Brain MRI 03/18/2023.  Head CT 03/18/2023. FINDINGS: Brain: Generalized cerebral atrophy. Patchy and ill-defined hypoattenuation within the cerebral white matter, nonspecific but compatible with moderate-to-severe  chronic small vessel ischemic disease. There is no acute intracranial hemorrhage. No demarcated cortical infarct. No extra-axial fluid collection. No evidence of an intracranial mass. No midline shift. Vascular: No hyperdense vessel.  Atherosclerotic calcifications. Skull: No calvarial fracture or aggressive osseous lesion. Sinuses/Orbits: No mass or acute finding within the imaged orbits. No significant paranasal sinus disease. ASPECTS [facility] Stroke Program Early CT Score) - Ganglionic level infarction (caudate, lentiform nuclei, internal capsule, insula, M1-M3 cortex): 7 - Supraganglionic infarction (M4-M6 cortex): 3 Total score (0-10 with 10 being normal): 10 No evidence of an acute intracranial abnormality. These results were communicated to Bhagat at 4:15 pmon 12/5/2024by text page via the [facility] messaging system. IMPRESSION: 1. No evidence of an acute intracranial abnormality. 2. Parenchymal atrophy and chronic small vessel ischemic disease. Electronically Signed   By: Jackey Loge D.O.   On: 11/04/2023 16:17    Pending Labs Unresulted Labs (From admission, onward)     Start     Ordered   11/05/23 0500  Lipid panel  (Labs)  Tomorrow morning,   R       Comments: Fasting    11/04/23 1633   11/05/23 0500  CBC  Tomorrow morning,   R        11/04/23 1820   11/05/23 0500  Basic metabolic panel  Tomorrow morning,   R        11/04/23 1820   11/04/23 1557  Urine rapid drug screen (hosp performed)  Once,   STAT        11/04/23 1556   11/04/23 1557  Urinalysis, Routine w reflex microscopic -Urine, Clean Catch  Once,   URGENT       Question:  Specimen Source  Answer:  Urine, Clean Catch   11/04/23 1556            Vitals/Pain Today's Vitals   11/04/23 2030 11/04/23 2115 11/04/23 2130 11/04/23 2153  BP: (!) 137/113 135/67 (!) 148/76   Pulse: (!) 110 100 100   Resp: (!) 21 (!) 26 (!) 24   Temp:    99.7 F (37.6 C)  TempSrc:    Oral  SpO2: 98% 98% 95%   Weight:      Height:         Isolation Precautions No active isolations  Medications Medications  clevidipine (CLEVIPREX) infusion 0.5 mg/mL (19 mg/hr Intravenous Infusion Verify 11/04/23 1957)   stroke: early stages of recovery book (has no administration in time range)  acetaminophen (TYLENOL) tablet 650 mg (has no administration in time range)    Or  acetaminophen (TYLENOL) 160 MG/5ML solution 650 mg (has no administration in time range)    Or  acetaminophen (TYLENOL) suppository 650 mg (has no administration in time range)  senna-docusate (Senokot-S) tablet 1 tablet (has no administration in time range)  pantoprazole (PROTONIX) injection 40 mg (has no administration in time range)  insulin aspart (novoLOG) injection 0-6 Units (2 Units Subcutaneous Given 11/04/23 2056)  0.9 %  sodium chloride infusion (has no administration in time range)  labetalol (NORMODYNE) injection 10 mg (has no administration in time range)  cyanocobalamin (VITAMIN B12) injection 1,000 mcg (0 mcg Intramuscular Hold 11/04/23 2108)    Followed by  cyanocobalamin (VITAMIN B12) injection 1,000 mcg (has no administration  in time range)    Followed by  cyanocobalamin (VITAMIN B12) tablet 1,000 mcg (has no administration in time range)  tenecteplase (TNKASE) injection for Stroke 15 mg (15 mg Intravenous Given 11/04/23 1622)  iohexol (OMNIPAQUE) 350 MG/ML injection 75 mL (75 mLs Intravenous Contrast Given 11/04/23 1623)  labetalol (NORMODYNE) injection 10 mg (10 mg Intravenous Given 11/04/23 1605)  labetalol (NORMODYNE) injection 10 mg (10 mg Intravenous Given 11/04/23 1612)  metoCLOPramide (REGLAN) injection 10 mg (10 mg Intravenous Given 11/04/23 1832)  metoprolol tartrate (LOPRESSOR) injection 2.5 mg (2.5 mg Intravenous Given 11/04/23 2101)    Mobility walks     Focused Assessments Neuro Assessment Handoff:  Swallow screen pass? No  Cardiac Rhythm: Sinus tachycardia (HR 100) NIH Stroke Scale  Dizziness Present: No Headache Present:  No Interval: Other (Comment) (q50min) Level of Consciousness (1a.)   : Not alert, but arousable by minor stimulation to obey, answer, or respond LOC Questions (1b. )   : Answers neither question correctly LOC Commands (1c. )   : Performs neither task correctly Best Gaze (2. )  : Normal Visual (3. )  : No visual loss Facial Palsy (4. )    : Normal symmetrical movements Motor Arm, Left (5a. )   : Some effort against gravity Motor Arm, Right (5b. ) : Drift Motor Leg, Left (6a. )  : Some effort against gravity Motor Leg, Right (6b. ) : Some effort against gravity Limb Ataxia (7. ): Absent Sensory (8. )  : Normal, no sensory loss Best Language (9. )  : Severe aphasia Dysarthria (10. ): Severe dysarthria, patient's speech is so slurred as to be unintelligible in the absence of or out of proportion to any dysphasia, or is mute/anarthric Extinction/Inattention (11.)   : No Abnormality Complete NIHSS TOTAL: 16 Last date known well: 11/04/23 Last time known well: 1330 Neuro Assessment:   Neuro Checks:   Initial (11/04/23 1610)  Has TPA been given? Yes Temp: 99.7 F (37.6 C) (12/05 2153) Temp Source: Oral (12/05 2153) BP: 148/76 (12/05 2130) Pulse Rate: 100 (12/05 2130) If patient is a Neuro Trauma and patient is going to OR before floor call report to 4N Charge nurse: [phone] or [phone]   R Recommendations: See Admitting Provider Note  Report given to:   Additional Notes: Patient is usually A/Ox4 but now she is not Alert or Oriented. Patient usually ambulates at home without a walker although she has been told before to use a assistive device.

## 2023-11-04 NOTE — ED Notes (Signed)
Neuro MD speaking with husband at [facility]. Pt remains restless.

## 2023-11-04 NOTE — ED Notes (Signed)
Stroke team remains at [facility]

## 2023-11-04 NOTE — ED Notes (Signed)
Remains restless, agitated, fidgety, frequently changing bed positions, poor bed mobility. Husband at [facility]. Reglan ordered for NV.

## 2023-11-04 NOTE — ED Notes (Signed)
Arrives back to ED exam room 27. Stroke team at [facility]. Neuro assessment and BP checks continue, cleviprex titrated. Switched to room monitor.

## 2023-11-04 NOTE — Consult Note (Deleted)
NEUROLOGY CONSULT NOTE   Date of service: November 04, 2023 Patient Name: Cindy Rhodes MRN:  [id number] DOB:  May 11, 1946 Chief Complaint: "confusion, left sided weakness" Requesting Provider: Stroke, Md, MD  History of Present Illness  Cindy Rhodes is a 77 y.o. woman with a past medical history significant for hypertension, hyperlipidemia, diabetes complicated by retinopathy, anxiety/depression, prolonged QT, recent admissions for hyponatremia, mild cognitive impairment on memantine  Has been reports that she has generally been somewhat fatigued today with increasing memory problems in the last few months.  However he woke her up for an appointment around noon and she got ready on her own as is normal for her.  She was last her normal self around 1 PM.  They stopped to get food from [employer] at 1:15 PM at which point she started seeming more confused and difficult for him to understand.  He is wheelchair to get her into the doctor's appointment.  On the physician's evaluation she was noted to have a left-sided weakness which the husband had not noticed and therefore EMS was activated for code stroke  LKW: 1 PM Modified rankin score: 2-Slight disability-UNABLE to perform all activities but does not need assistance  Per neurology reports she does have some mild memory issues and has been noted to NOT be taking her medications as prescribed at times but continues to be independent with ADLs and driving without difficulty IV Thrombolysis: Yes  EVT: No, no LVO   NIHSS components Score: Comment  1a Level of Conscious 0[]  1[]  2[x]  3[]      1b LOC Questions 0[]  1[]  2[x]       1c LOC Commands 0[x]  1[]  2[]       2 Best Gaze 0[]  1[]  2[x]       3 Visual 0[]  1[]  2[x]  3[]      4 Facial Palsy 0[]  1[x]  2[]  3[]      5a Motor Arm - left 0[]  1[]  2[x]  3[]  4[]  UN[]    5b Motor Arm - Right 0[x]  1[]  2[]  3[]  4[]  UN[]    6a Motor Leg - Left 0[]  1[]  2[x]  3[]  4[]  UN[]    6b Motor Leg - Right 0[]  1[]  2[x]  3[]  4[]  UN[]     7 Limb Ataxia 0[]  1[]  2[]  3[]  UN[x]     8 Sensory 0[]  1[x]  2[]  UN[]      9 Best Language 0[]  1[]  2[x]  3[]      10 Dysarthria 0[]  1[x]  2[]  UN[]      11 Extinct. and Inattention 0[]  1[x]  2[]       TOTAL: 20       ROS  Unable to obtain due to mental status  Past History   Past Medical History:  Diagnosis Date   Acute lower UTI 08/12/2017   Acute metabolic encephalopathy 03/18/2023   Atrophy of vagina 05/14/2021   Cerebrovascular disease    moderate to severe per MRI   DDD (degenerative disc disease), cervical 10/13/2019   Decreased estrogen level 05/14/2021   Diabetic renal disease 05/14/2021   Essential hypertension 08/12/2017   Generalized anxiety disorder 05/14/2021   Hypertensive crisis 03/18/2023   Hypokalemia 08/12/2017   Hypomagnesemia 03/18/2023   Hyponatremia 08/12/2017   Interstitial cystitis 05/14/2021   Leukocytosis 05/14/2021   Major depressive disorder in remission 05/14/2021   Migraine without aura, not refractory 05/14/2021   Mild cognitive impairment of uncertain or unknown etiology 05/27/2023   Neck pain 08/28/2019   Osteopenia 05/14/2021   Pain in right hand 05/08/2019   Pincer nail deformity 01/24/2020   Prolonged QT interval  03/18/2023   Pure hypercholesterolemia 05/14/2021   Spinal stenosis in cervical region 10/13/2019   Subacute vulvitis 05/14/2021   Trigger finger of right hand 05/08/2019   Type 2 diabetes mellitus with hyperglycemia 08/12/2017   Urge incontinence of urine 05/14/2021    Past Surgical History:  Procedure Laterality Date   ABDOMINAL HYSTERECTOMY     HAND SURGERY Left     Family History: Family History  Problem Relation Age of Onset   Hypertension Other     Social History  reports that she has quit smoking. She has never used smokeless tobacco. She reports that she does not drink alcohol and does not use drugs.  Allergies  Allergen Reactions   Hydrochlorothiazide Other (See Comments)    Unknown    Meloxicam Other (See  Comments)    Dizzy and nervous    Pork-Derived Products Other (See Comments)    Religious    Sulfa Antibiotics Other (See Comments)    Shuts down urinary system     Medications   Current Facility-Administered Medications:    [START ON 11/05/2023]  stroke: early stages of recovery book, , Does not apply, Once, Dawn Convery L, MD   0.9 %  sodium chloride infusion, , Intravenous, Continuous, Koby Hartfield L, MD   acetaminophen (TYLENOL) tablet 650 mg, 650 mg, Oral, Q4H PRN **OR** acetaminophen (TYLENOL) 160 MG/5ML solution 650 mg, 650 mg, Per Tube, Q4H PRN **OR** acetaminophen (TYLENOL) suppository 650 mg, 650 mg, Rectal, Q4H PRN, Sheron Tallman L, MD   clevidipine (CLEVIPREX) infusion 0.5 mg/mL, 0-21 mg/hr, Intravenous, Continuous, Bayne Fosnaugh L, MD   labetalol (NORMODYNE) injection 10 mg, 10 mg, Intravenous, Once, Zaryah Seckel L, MD   labetalol (NORMODYNE) injection 10 mg, 10 mg, Intravenous, Once, Jordany Russett L, MD   pantoprazole (PROTONIX) injection 40 mg, 40 mg, Intravenous, QHS, Jiraiya Mcewan L, MD   senna-docusate (Senokot-S) tablet 1 tablet, 1 tablet, Oral, QHS PRN, Onalee Steinbach L, MD   tenecteplase (TNKASE) injection for Stroke 15 mg, 0.25 mg/kg, Intravenous, Once, Hanalei Glace L, MD  Current Outpatient Medications:    amLODipine (NORVASC) 5 MG tablet, Take 1 tablet (5 mg total) by mouth daily., Disp: 30 tablet, Rfl: 1   Ascorbic Acid (VITAMIN C) 500 MG CAPS, Take 500 mg by mouth daily., Disp: , Rfl:    Calcium Carbonate-Vitamin D 600-400 MG-UNIT tablet, Take 1 tablet by mouth daily., Disp: , Rfl:    Coenzyme Q10 (CO Q 10 PO), Take 1 capsule by mouth daily., Disp: , Rfl:    CRESTOR 5 MG tablet, Take 2.5 mg by mouth at bedtime. , Disp: , Rfl:    famotidine (PEPCID) 20 MG tablet, Take 20 mg by mouth daily., Disp: , Rfl:    glucosamine-chondroitin 500-400 MG tablet, Take 3 tablets by mouth daily. , Disp: , Rfl:    memantine (NAMENDA) 5 MG tablet, TAKE 1  TABLET(5 MG) BY MOUTH EVERY NIGHT FOR 2 WEEKS, THEN INCREASE TO TAKE 1 TABLET (5 MG) BY MOUTH 2 TIMES A DAY, Disp: 60 tablet, Rfl: 3   metFORMIN (GLUCOPHAGE-XR) 500 MG 24 hr tablet, Take 1,000 mg by mouth 2 (two) times daily., Disp: , Rfl:    metoprolol tartrate (LOPRESSOR) 25 MG tablet, Take 25 mg by mouth 2 (two) times daily. , Disp: , Rfl:    Misc Natural Products (TART CHERRY ADVANCED) CAPS, Take 1 capsule by mouth daily., Disp: , Rfl:    Multiple Vitamin (MULTIVITAMIN WITH MINERALS) TABS tablet, Take 1 tablet by mouth daily.,  Disp: , Rfl:    oxybutynin (DITROPAN-XL) 10 MG 24 hr tablet, Take 10 mg by mouth daily. , Disp: , Rfl:    sertraline (ZOLOFT) 50 MG tablet, Take 50 mg by mouth daily., Disp: , Rfl:    spironolactone (ALDACTONE) 25 MG tablet, Take 0.5 tablets (12.5 mg total) by mouth daily., Disp: 30 tablet, Rfl: 2   telmisartan (MICARDIS) 80 MG tablet, Take 1 tablet (80 mg total) by mouth daily., Disp: 30 tablet, Rfl: 0  Vitals   Vitals:   2023/11/12 1600  Weight: 60.6 kg  Height: 5\' 1"  (1.549 m)    Body mass index is 25.24 kg/m.  Physical Exam   Constitutional: Appears somewhat ill Psych: Too lethargic to assess Eyes: No scleral injection.  HENT: No OP obstruction.  Cardiovascular: Normal rate and regular rhythm.  Respiratory: Effort normal, non-labored breathing.  GI: Soft.  No distension. There is no tenderness.  Skin: No visible skin lesions  Neurologic Examination    Neuro: Mental Status: Patient is extremely sleepy which does highly limit examination.  At best she opens her eyes to repeated stimulation and after her eyes are open for her keep them open only for a short period of time. Cranial Nerves: II: Visual Fields are difficult to assess due to frequent eye closing but she does seem to have a left hemianopia. Pupils are equal, round, and reactive to light.   III,IV, VI: Strong left gaze preference.  With VOR will cross fully to the right but will not orient  past midline V: Facial sensation is symmetric to eyelash brush VII: Facial movement is notable for left facial droop.  VIII: hearing is intact to voice Motor: Poor effort throughout.  With extensive coaching was able to maintain her right upper extremity antigravity.  All other extremities had brief antigravity effort but quickly hit the bed.  On casual observation did seem very mildly weaker in the left leg compared to the right but she was spontaneously using them both in attempts to reposition herself Sensory: Less reactive to noxious stimulation in the left upper extremity compared the right Cerebellar: Unable to assess secondary to patient's mental status    Labs/Imaging/Neurodiagnostic studies   CBC:  Recent Labs  Lab 11/12/23 1558 Nov 12, 2023 1602  WBC 8.2  --   NEUTROABS 6.3  --   HGB 11.2* 11.2*  HCT 32.6* 33.0*  MCV 86.5  --   PLT 215  --    Basic Metabolic Panel:  Lab Results  Component Value Date   NA 132 (L) 2023-11-12   K 3.8 Nov 12, 2023   CO2 20 (L) 03/28/2023   GLUCOSE 142 (H) 11/12/23   BUN 25 (H) 2023-11-12   CREATININE 1.30 (H) Nov 12, 2023   CALCIUM 8.6 (L) 03/28/2023   GFRNONAA >60 03/28/2023   GFRAA >60 08/18/2019   Lipid Panel: No results found for: "LDLCALC" HgbA1c:  Lab Results  Component Value Date   HGBA1C 5.8 (H) 03/19/2023    INR  Lab Results  Component Value Date   INR 1.1 11/12/2023   APTT  Lab Results  Component Value Date   APTT 32 Nov 12, 2023   AED levels: No results found for: "PHENYTOIN", "ZONISAMIDE", "LAMOTRIGINE", "LEVETIRACETA"  CT Head without contrast(Personally reviewed):  1. No evidence of an acute intracranial abnormality. 2. Parenchymal atrophy and chronic small vessel ischemic disease.  CT angio Head and Neck with contrast(Personally reviewed): CTA neck: 1. The common carotid and internal carotid arteries are patent within the neck without stenosis. Mild atherosclerotic  plaque bilaterally, as described. 2.  The vertebral arteries are patent within the neck. Non-stenotic atherosclerotic plaque at both vertebral artery origins. Severe narrowing of the V2 left vertebral artery at the C5-C6 level due to degenerative osseous spurring. CTA head: 1. No proximal intracranial large vessel occlusion identified. 2. Moderate stenosis within the right anterior cerebral artery proximal A2 segment. 3. Short-segment fenestration within the mid-to-distal basilar artery (anatomic variant).   ASSESSMENT   Cindy Rhodes is a 77 y.o. s/p TNK for clinical concern for stroke causing left-sided weakness and right gaze deviation.  Focal seizure would be in the differential, however no history of the same and no severe electrolyte derangements on this presentation, and husband denies any witnessed seizure-like activity.   Exam concerning for a right MCA syndrome, given no LVO could also consider thalamic localization.  Hypertensive encephalopathy felt to be less likely given strong focality of examination  RECOMMENDATIONS   # Likely stroke determined by clinical assessment - Stroke labs HgbA1c, fasting lipid panel - MRI brain 24 hours post TNK - Frequent neuro checks - Echocardiogram - Hold antiplatelets and Lovenox/heparin for 24 hours until post TNK head imaging completed  - Risk factor modification - Telemetry monitoring - Blood pressure goal   - Post TNK for 24  hours < 180/105, clevidipine ordered and PRN labetalol - PT consult, OT consult, Speech consult, unless patient is back to baseline - Routine EEG given degree of encephalopathy - Stroke team to follow - 500 cc Normal saline (100 cc/hr x 5 hours) due to AKI and contrast load  # History of hyponatremia with concern for polydipsia -Monitor BMP, consider fluid restriction if needed when tolerating p.o.  # DM2 -Low dose SSI q4hrs until taking PO  -A1c goal < 7%  # ECG abnormalities -Appreciate review by ED provider who feels EKG is stable  from prior and recommended checking troponin  # B12 deficiency, with neurological symptoms - B12 1000 mcg IM daily x 7 days, then weekly x 7 weeks, then 1000 mcg PO  - Monitor level outpatient ______________________________________________________________________    Nunzio Cory MD-PhD [facility] [phone] Available 7 AM to 7 PM, outside these hours please contact Neurologist on call listed on AMION   CRITICAL CARE Performed by: Gordy Councilman   Total critical care time: 70 minutes  Critical care time was exclusive of separately billable procedures and treating other patients.  Critical care was necessary to treat or prevent imminent or life-threatening deterioration.  Critical care was time spent personally by me on the following activities: development of treatment plan with patient and/or surrogate as well as nursing, discussions with consultants, evaluation of patient's response to treatment, examination of patient, obtaining history from patient or surrogate, ordering and performing treatments and interventions, ordering and review of laboratory studies, ordering and review of radiographic studies, pulse oximetry and re-evaluation of patient's condition.

## 2023-11-04 NOTE — Code Documentation (Addendum)
Stroke Response Nurse Documentation Code Documentation  Cindy Rhodes is a 77 y.o. female arriving to [facility]  via [facility] on 11/04/23 with past medical hx of hypertension, diabetes mellitus type 2, hypoosmolar hyponatremia, Acute metabolic encephalopathy Cerebrovascular disease, DDD (degenerative disc disease), anxiety, Mild cognitive impairment of uncertain or unknown etiology, Major depressive disorder  . On No antithrombotic. Code stroke was activated by EMS.   Patient from home where she was LKW at 1330 and now complaining of left sided weakness, AMS, aphasia. Patient was going to her routine doctor's exam with her husband and he noted in the car around 130 she suddenly began not acting correctly. She was having difficulty talking, seemed confused and was not able to follow commands. Upon arrival to the doctor's office they called 911.   Stroke team at the bedside on patient arrival. Labs drawn and patient cleared for CT by Dr. Anitra Lauth. Patient to CT with team. NIHSS 15, see documentation for details and code stroke times.  CT Head and CTA. Patient is a candidate for IV Thrombolytic. TNK given at 1622. Patient is not a candidate for IR due to no LVO on imaging.    Care Plan: Q15 minute vitals and neauro checks until 1830, Q30 vitals and neruo checks for 6 hours, Q 1 hour vitals and neauro checks for 16 hours. BP goal <180/105.  Bedside handoff with ED RN Jonny Ruiz.    Mordecai Rasmussen  Stroke Response RN

## 2023-11-04 NOTE — Progress Notes (Signed)
Pharmacy Antimicrobial Note  Cindy Rhodes is a 77 y.o. female admitted on 11/04/2023 as code stroke, now concern for meningitis.  Pharmacy has been consulted for vancomycin, ceftriaxone, ampicillin, and acyclovir dosing.  Plan: Vancomycin 1250mg  x1 then 750mg  IV Q24H.  Goal trough 15-20 mcg/mL. Ceftriaxone 2g IV Q12H. Ampicillin 2g IV Q6H. Acyclovir 500mg  IV Q12H.  Height: 5\' 1"  (154.9 cm) Weight: 57.7 kg (127 lb 3.3 oz) IBW/kg (Calculated) : 47.8  Temp (24hrs), Avg:100.5 F (38.1 C), Min:99.2 F (37.3 C), Max:102.5 F (39.2 C)  Recent Labs  Lab 11/04/23 1558 11/04/23 1602  WBC 8.2  --   CREATININE 1.15* 1.30*    Estimated Creatinine Clearance: 30.1 mL/min (A) (by C-G formula based on SCr of 1.3 mg/dL (H)).    Allergies  Allergen Reactions   Hydrochlorothiazide Other (See Comments)    Unknown    Meloxicam Other (See Comments)    Dizzy and nervous    Pork-Derived Products Other (See Comments)    Religious    Sulfa Antibiotics Other (See Comments)    Shuts down urinary system     Thank you for allowing pharmacy to be a part of this patient's care.  Vernard Gambles, PharmD, BCPS  11/04/2023 11:38 PM

## 2023-11-04 NOTE — ED Triage Notes (Signed)
BIB [facility] from [facility] across the [address], was coming to PCP for routine check when husband noticed change from baseline at 1330 enroute to PCP. EMS LVO score 3. EMS mentions AMS/ confusion, L side weak. NSL 18g L AC. Arrives to full neuro team and EDP at bridge. Neuro assessment begun. Airway patent. No following commands or verbal. Blood obtained straight to CT.

## 2023-11-04 NOTE — ED Notes (Signed)
Intermittent gagging, possible nausea, EDP notified.

## 2023-11-04 NOTE — Progress Notes (Signed)
LTM EEG running - no initial skin breakdown - push button tested - neuro notified. [facility] NOT notified, ED hookup Hu charge captured

## 2023-11-05 ENCOUNTER — Inpatient Hospital Stay (HOSPITAL_COMMUNITY): Payer: PPO

## 2023-11-05 DIAGNOSIS — I6389 Other cerebral infarction: Secondary | ICD-10-CM | POA: Diagnosis not present

## 2023-11-05 DIAGNOSIS — R569 Unspecified convulsions: Secondary | ICD-10-CM

## 2023-11-05 DIAGNOSIS — I639 Cerebral infarction, unspecified: Secondary | ICD-10-CM | POA: Diagnosis not present

## 2023-11-05 LAB — ECHOCARDIOGRAM COMPLETE
AR max vel: 1.81 cm2
AV Area VTI: 1.82 cm2
AV Area mean vel: 1.63 cm2
AV Mean grad: 5 mm[Hg]
AV Peak grad: 9 mm[Hg]
Ao pk vel: 1.5 m/s
Area-P 1/2: 5.06 cm2
Height: 61 in
S' Lateral: 2.3 cm
Weight: 2035.29 [oz_av]

## 2023-11-05 LAB — RESPIRATORY PANEL BY PCR

## 2023-11-05 LAB — BASIC METABOLIC PANEL
Anion gap: 14 (ref 5–15)
BUN: 22 mg/dL (ref 8–23)
CO2: 22 mmol/L (ref 22–32)
Calcium: 8.5 mg/dL — ABNORMAL LOW (ref 8.9–10.3)
Chloride: 99 mmol/L (ref 98–111)
Creatinine, Ser: 1.32 mg/dL — ABNORMAL HIGH (ref 0.44–1.00)
GFR, Estimated: 42 mL/min — ABNORMAL LOW (ref >60)
Glucose, Bld: 182 mg/dL — ABNORMAL HIGH (ref 70–99)
Potassium: 2.8 mmol/L — ABNORMAL LOW (ref 3.5–5.1)
Sodium: 135 mmol/L (ref 135–145)

## 2023-11-05 LAB — LIPID PANEL
Cholesterol: 145 mg/dL (ref 0–200)
HDL: 51 mg/dL (ref >40)
LDL Cholesterol: 73 mg/dL (ref 0–99)
Total CHOL/HDL Ratio: 2.8 {ratio}
Triglycerides: 105 mg/dL (ref <150)
VLDL: 21 mg/dL (ref 0–40)

## 2023-11-05 LAB — GLUCOSE, CAPILLARY
Glucose-Capillary: 110 mg/dL — ABNORMAL HIGH (ref 70–99)
Glucose-Capillary: 128 mg/dL — ABNORMAL HIGH (ref 70–99)
Glucose-Capillary: 166 mg/dL — ABNORMAL HIGH (ref 70–99)
Glucose-Capillary: 170 mg/dL — ABNORMAL HIGH (ref 70–99)
Glucose-Capillary: 203 mg/dL — ABNORMAL HIGH (ref 70–99)
Glucose-Capillary: 209 mg/dL — ABNORMAL HIGH (ref 70–99)

## 2023-11-05 LAB — CBC
HCT: 29.9 % — ABNORMAL LOW (ref 36.0–46.0)
Hemoglobin: 10.8 g/dL — ABNORMAL LOW (ref 12.0–15.0)
MCH: 30.6 pg (ref 26.0–34.0)
MCHC: 36.1 g/dL — ABNORMAL HIGH (ref 30.0–36.0)
MCV: 84.7 fL (ref 80.0–100.0)
Platelets: 234 10*3/uL (ref 150–400)
RBC: 3.53 MIL/uL — ABNORMAL LOW (ref 3.87–5.11)
RDW: 13.1 % (ref 11.5–15.5)
WBC: 17.5 10*3/uL — ABNORMAL HIGH (ref 4.0–10.5)
nRBC: 0 % (ref 0.0–0.2)

## 2023-11-05 LAB — RESP PANEL BY RT-PCR (RSV, FLU A&B, COVID)  RVPGX2
Influenza A by PCR: NEGATIVE
Influenza B by PCR: NEGATIVE
Resp Syncytial Virus by PCR: NEGATIVE
SARS Coronavirus 2 by RT PCR: NEGATIVE

## 2023-11-05 LAB — MRSA NEXT GEN BY PCR, NASAL: MRSA by PCR Next Gen: NOT DETECTED

## 2023-11-05 MED ORDER — LORAZEPAM 2 MG/ML IJ SOLN
1.0000 mg | Freq: Once | INTRAMUSCULAR | Status: DC
  Filled 2023-11-05: qty 1

## 2023-11-05 MED ORDER — MELATONIN 3 MG PO TABS
3.0000 mg | ORAL_TABLET | Freq: Every day | ORAL | Status: DC
  Administered 2023-11-05 – 2023-11-06 (×2): 3 mg via ORAL
  Filled 2023-11-05 (×2): qty 1

## 2023-11-05 MED ORDER — AMLODIPINE BESYLATE 5 MG PO TABS
5.0000 mg | ORAL_TABLET | Freq: Every day | ORAL | Status: DC
Start: 2023-11-06 — End: 2023-11-07
  Administered 2023-11-06 – 2023-11-07 (×2): 5 mg via ORAL
  Filled 2023-11-05 (×2): qty 1

## 2023-11-05 MED ORDER — SPIRONOLACTONE 12.5 MG HALF TABLET
12.5000 mg | ORAL_TABLET | Freq: Every day | ORAL | Status: DC
Start: 2023-11-06 — End: 2023-11-07
  Administered 2023-11-06 – 2023-11-07 (×2): 12.5 mg via ORAL
  Filled 2023-11-05 (×2): qty 1

## 2023-11-05 NOTE — Evaluation (Signed)
Occupational Therapy Evaluation Patient Details Name: Cindy Rhodes MRN: [id number] DOB: 08/21/1946 Today's Date: 11/05/2023   History of Present Illness Cindy Rhodes is a 77 y.o. woman admitted with left-sided weakness, CT (-), TNK administered. PHMx: hypertension, hyperlipidemia, diabetes complicated by retinopathy, anxiety/depression, prolonged QT, recent admissions for hyponatremia, mild cognitive impairment   Clinical Impression   This 77 yo female admitted with above presents to acute OT with PLOF of being totally independent with basic ADLs and some IADLs and driving very little she reports. Currently she is at a setup-min A level for basic ADLs. She will continue to benefit from acute OT with follow up HHOT.       If plan is discharge home, recommend the following: A little help with walking and/or transfers;A little help with bathing/dressing/bathroom;Help with stairs or ramp for entrance;Assist for transportation;Supervision due to cognitive status;Assistance with cooking/housework    Functional Status Assessment  Patient has had a recent decline in their functional status and demonstrates the ability to make significant improvements in function in a reasonable and predictable amount of time.  Equipment Recommendations  None recommended by OT       Precautions / Restrictions Precautions Precautions: Fall Restrictions Weight Bearing Restrictions: No      Mobility Bed Mobility Overal bed mobility: Needs Assistance Bed Mobility: Supine to Sit     Supine to sit: Min assist     General bed mobility comments: HOB flat, no rail    Transfers Overall transfer level: Needs assistance Equipment used: 1 person hand held assist Transfers: Sit to/from Stand, Bed to chair/wheelchair/BSC Sit to Stand: Min assist     Step pivot transfers: Min assist            Balance Overall balance assessment: Needs assistance Sitting-balance support: No upper extremity supported,  Feet supported Sitting balance-Leahy Scale: Fair     Standing balance support: Bilateral upper extremity supported Standing balance-Leahy Scale: Poor                             ADL either performed or assessed with clinical judgement   ADL Overall ADL's : Needs assistance/impaired Eating/Feeding: Independent;Sitting   Grooming: Set up;Sitting   Upper Body Bathing: Set up;Sitting   Lower Body Bathing: Minimal assistance;Sit to/from stand   Upper Body Dressing : Set up;Sitting   Lower Body Dressing: Minimal assistance;Sit to/from stand   Toilet Transfer: Minimal assistance Toilet Transfer Details (indicate cue type and reason): simullated stand pivot from bed to recliner Toileting- Clothing Manipulation and Hygiene: Minimal assistance;Sit to/from stand         General ADL Comments: Recommended to husband and son that pt would benefit from a shower seat and a hand held shower head     Vision Baseline Vision/History: 1 Wears glasses (reading only) Patient Visual Report: No change from baseline Vision Assessment?: Yes Eye Alignment: Within Functional Limits Ocular Range of Motion: Within Functional Limits Alignment/Gaze Preference: Within Defined Limits Tracking/Visual Pursuits: Able to track stimulus in all quads without difficulty Convergence: Within functional limits Visual Fields: No apparent deficits            Pertinent Vitals/Pain Pain Assessment Pain Assessment: No/denies pain           Cognition Arousal: Alert Behavior During Therapy: WFL for tasks assessed/performed Overall Cognitive Status: History of cognitive impairments - at baseline  General Comments: h/o of mild cognitive impairment. A & O x3 (could not tell us why she was in the hospital); while testing her vision the pen color she was suppose to be looking for got all the way in her opposite visual field and then she said "oh, what am I  suppose to be looking for"--the 2nd attempt she got it right away     General Comments  VSS on RA            Home Living Family/patient expects to be discharged to:: Private residence Living Arrangements: Spouse/significant other Available Help at Discharge: Family;Available 24 hours/day Type of Home: Independent living facility Home Access: Elevator     Home Layout: One level     Bathroom Shower/Tub: Producer, television/film/video: Handicapped height     Home Equipment: Cane - single point   Additional Comments: staff provides cleaning, meals in dining room. no stairs to enter. Apartment on second floor, but has elevator. Walk in shower with grab bars, no seat. Handicapped height toilet.      Prior Functioning/Environment Prior Level of Function : Independent/Modified Independent             Mobility Comments: Has SPC, has gone to PT recently but did not continue. ADLs Comments: husband drives. indep in medication management for both herself and her husband. Husband does the finances.        OT Problem List: Impaired balance (sitting and/or standing);Decreased cognition;Decreased safety awareness      OT Treatment/Interventions: Self-care/ADL training;Patient/family education;Balance training    OT Goals(Current goals can be found in the care plan section) Acute Rehab OT Goals Patient Stated Goal: agreeable to get up with therapy OT Goal Formulation: With patient/family Time For Goal Achievement: 11/19/23 Potential to Achieve Goals: Good  OT Frequency: Min 1X/week    Co-evaluation PT/OT/SLP Co-Evaluation/Treatment: Yes Reason for Co-Treatment: For patient/therapist safety;To address functional/ADL transfers PT goals addressed during session: Mobility/safety with mobility;Balance;Strengthening/ROM OT goals addressed during session: Strengthening/ROM;ADL's and self-care      AM-PAC OT "6 Clicks" Daily Activity     Outcome Measure Help from another  person eating meals?: None Help from another person taking care of personal grooming?: A Little Help from another person toileting, which includes using toliet, bedpan, or urinal?: A Little Help from another person bathing (including washing, rinsing, drying)?: A Little Help from another person to put on and taking off regular upper body clothing?: A Little Help from another person to put on and taking off regular lower body clothing?: A Little 6 Click Score: 19   End of Session Nurse Communication: Mobility status  Activity Tolerance: Patient tolerated treatment well Patient left: in chair;with call bell/phone within reach;with chair alarm set  OT Visit Diagnosis: Unsteadiness on feet (R26.81);Other abnormalities of gait and mobility (R26.89);Other symptoms and signs involving cognitive function                Time: 2841-3244 OT Time Calculation (min): 22 min Charges:  OT General Charges $OT Visit: 1 Visit OT Evaluation $OT Eval Moderate Complexity: 1 Mod  Cathy L. OT Acute Rehabilitation Services Office [phone]    Cindy Rhodes 11/05/2023, 12:54 PM

## 2023-11-05 NOTE — Progress Notes (Signed)
OT Cancellation Note  Patient Details Name: Cindy Rhodes MRN: [id number] DOB: [birth year]   Cancelled Treatment:    Reason Eval/Treat Not Completed: Active bedrest order--pt on bedrest due to TNK given at 16:22 on 11/04/2023.  Lindon Romp OT Acute Rehabilitation Services Office [phone]    Evette Georges 11/05/2023, 7:37 AM

## 2023-11-05 NOTE — Progress Notes (Signed)
Brief Neuro Update:  Birfely, Ms. Cindy Rhodes presented with R MCA syndrome s/p tnkase and improved. Family at bedside. I reviewed MRI brain with family and notable for punctate stroke in left posterior IC which does not explain her presentation. LTM EEG with slowing which improved over the course of the LTM EEG. She is on empiric coverage for meningitis.   I cannot find any updates in the notes from the day time about any plans for LP. I presume that this is being considered since she is still on empiric coverage and so far MRI and EEG have been non revealing.  At family's request, resumed home norvasc and spironolactone in an attempt to wean her off celviprex and get her out of the ICU. This is a very reasonable request.  Further updates per day time stroke team.  Erick Blinks [facility]

## 2023-11-05 NOTE — Progress Notes (Signed)
Pt moved to new room by RN, [facility] unable to monitor. Study restarted, pc restarted, study running online, [facility] still unable to monitor

## 2023-11-05 NOTE — H&P (Signed)
NEUROLOGY CONSULT NOTE    Date of service: November 04, 2023 Patient Name: Cindy Rhodes MRN:  [id number] DOB:  September 13, 1946 Chief Complaint: "confusion, left sided weakness" Requesting Provider: Stroke, Md, MD   History of Present Illness  Cindy Rhodes is a 77 y.o. woman with a past medical history significant for hypertension, hyperlipidemia, diabetes complicated by retinopathy, anxiety/depression, prolonged QT, recent admissions for hyponatremia, mild cognitive impairment on memantine   Has been reports that she has generally been somewhat fatigued today with increasing memory problems in the last few months.  However he woke her up for an appointment around noon and she got ready on her own as is normal for her.  She was last her normal self around 1 PM.  They stopped to get food from [employer] at 1:15 PM at which point she started seeming more confused and difficult for him to understand.  He is wheelchair to get her into the doctor's appointment.  On the physician's evaluation she was noted to have a left-sided weakness which the husband had not noticed and therefore EMS was activated for code stroke   LKW: 1 PM Modified rankin score: 2-Slight disability-UNABLE to perform all activities but does not need assistance             Per neurology reports she does have some mild memory issues and has been noted to NOT be taking her medications as prescribed at times but continues to be independent with ADLs and driving without difficulty IV Thrombolysis: Yes  EVT: No, no LVO              NIHSS components Score: Comment  1a Level of Conscious 0[]  1[]  2[x]  3[]         1b LOC Questions 0[]  1[]  2[x]           1c LOC Commands 0[x]  1[]  2[]           2 Best Gaze 0[]  1[]  2[x]           3 Visual 0[]  1[]  2[x]  3[]         4 Facial Palsy 0[]  1[x]  2[]  3[]         5a Motor Arm - left 0[]  1[]  2[x]  3[]  4[]  UN[]     5b Motor Arm - Right 0[x]  1[]  2[]  3[]  4[]  UN[]     6a Motor Leg - Left 0[]  1[]  2[x]  3[]  4[]  UN[]      6b Motor Leg - Right 0[]  1[]  2[x]  3[]  4[]  UN[]     7 Limb Ataxia 0[]  1[]  2[]  3[]  UN[x]       8 Sensory 0[]  1[x]  2[]  UN[]         9 Best Language 0[]  1[]  2[x]  3[]         10 Dysarthria 0[]  1[x]  2[]  UN[]         11 Extinct. and Inattention 0[]  1[x]  2[]           TOTAL: 20          ROS  Unable to obtain due to mental status   Past History        Past Medical History:  Diagnosis Date   Acute lower UTI 08/12/2017   Acute metabolic encephalopathy 03/18/2023   Atrophy of vagina 05/14/2021   Cerebrovascular disease      moderate to severe per MRI   DDD (degenerative disc disease), cervical 10/13/2019   Decreased estrogen level 05/14/2021   Diabetic renal disease 05/14/2021   Essential hypertension 08/12/2017   Generalized anxiety disorder 05/14/2021   Hypertensive  crisis 03/18/2023   Hypokalemia 08/12/2017   Hypomagnesemia 03/18/2023   Hyponatremia 08/12/2017   Interstitial cystitis 05/14/2021   Leukocytosis 05/14/2021   Major depressive disorder in remission 05/14/2021   Migraine without aura, not refractory 05/14/2021   Mild cognitive impairment of uncertain or unknown etiology 05/27/2023   Neck pain 08/28/2019   Osteopenia 05/14/2021   Pain in right hand 05/08/2019   Pincer nail deformity 01/24/2020   Prolonged QT interval 03/18/2023   Pure hypercholesterolemia 05/14/2021   Spinal stenosis in cervical region 10/13/2019   Subacute vulvitis 05/14/2021   Trigger finger of right hand 05/08/2019   Type 2 diabetes mellitus with hyperglycemia 08/12/2017   Urge incontinence of urine 05/14/2021               Past Surgical History:  Procedure Laterality Date   ABDOMINAL HYSTERECTOMY       HAND SURGERY Left            Family History:      Family History  Problem Relation Age of Onset   Hypertension Other            Social History  reports that she has quit smoking. She has never used smokeless tobacco. She reports that she does not drink alcohol and does not use  drugs.   Allergies       Allergies  Allergen Reactions   Hydrochlorothiazide Other (See Comments)      Unknown    Meloxicam Other (See Comments)      Dizzy and nervous    Pork-Derived Products Other (See Comments)      Religious    Sulfa Antibiotics Other (See Comments)      Shuts down urinary system          Medications   Current Medications    Current Facility-Administered Medications:    [START ON 11/05/2023]  stroke: early stages of recovery book, , Does not apply, Once, Cindy Pennino L, MD   0.9 %  sodium chloride infusion, , Intravenous, Continuous, Cindy Trueba L, MD   acetaminophen (TYLENOL) tablet 650 mg, 650 mg, Oral, Q4H PRN **OR** acetaminophen (TYLENOL) 160 MG/5ML solution 650 mg, 650 mg, Per Tube, Q4H PRN **OR** acetaminophen (TYLENOL) suppository 650 mg, 650 mg, Rectal, Q4H PRN, Cindy Drum L, MD   clevidipine (CLEVIPREX) infusion 0.5 mg/mL, 0-21 mg/hr, Intravenous, Continuous, Cindy Wisler L, MD   labetalol (NORMODYNE) injection 10 mg, 10 mg, Intravenous, Once, Cindy Hardie L, MD   labetalol (NORMODYNE) injection 10 mg, 10 mg, Intravenous, Once, Cindy Minner L, MD   pantoprazole (PROTONIX) injection 40 mg, 40 mg, Intravenous, QHS, Cindy Lyday L, MD   senna-docusate (Senokot-S) tablet 1 tablet, 1 tablet, Oral, QHS PRN, Cindy Lindquist L, MD   tenecteplase (TNKASE) injection for Stroke 15 mg, 0.25 mg/kg, Intravenous, Once, Cindy Creech L, MD   Current Outpatient Medications:    amLODipine (NORVASC) 5 MG tablet, Take 1 tablet (5 mg total) by mouth daily., Disp: 30 tablet, Rfl: 1   Ascorbic Acid (VITAMIN C) 500 MG CAPS, Take 500 mg by mouth daily., Disp: , Rfl:    Calcium Carbonate-Vitamin D 600-400 MG-UNIT tablet, Take 1 tablet by mouth daily., Disp: , Rfl:    Coenzyme Q10 (CO Q 10 PO), Take 1 capsule by mouth daily., Disp: , Rfl:    CRESTOR 5 MG tablet, Take 2.5 mg by mouth at bedtime. , Disp: , Rfl:    famotidine (PEPCID) 20 MG tablet,  Take 20 mg by mouth daily.,  Disp: , Rfl:    glucosamine-chondroitin 500-400 MG tablet, Take 3 tablets by mouth daily. , Disp: , Rfl:    memantine (NAMENDA) 5 MG tablet, TAKE 1 TABLET(5 MG) BY MOUTH EVERY NIGHT FOR 2 WEEKS, THEN INCREASE TO TAKE 1 TABLET (5 MG) BY MOUTH 2 TIMES A DAY, Disp: 60 tablet, Rfl: 3   metFORMIN (GLUCOPHAGE-XR) 500 MG 24 hr tablet, Take 1,000 mg by mouth 2 (two) times daily., Disp: , Rfl:    metoprolol tartrate (LOPRESSOR) 25 MG tablet, Take 25 mg by mouth 2 (two) times daily. , Disp: , Rfl:    Misc Natural Products (TART CHERRY ADVANCED) CAPS, Take 1 capsule by mouth daily., Disp: , Rfl:    Multiple Vitamin (MULTIVITAMIN WITH MINERALS) TABS tablet, Take 1 tablet by mouth daily., Disp: , Rfl:    oxybutynin (DITROPAN-XL) 10 MG 24 hr tablet, Take 10 mg by mouth daily. , Disp: , Rfl:    sertraline (ZOLOFT) 50 MG tablet, Take 50 mg by mouth daily., Disp: , Rfl:    spironolactone (ALDACTONE) 25 MG tablet, Take 0.5 tablets (12.5 mg total) by mouth daily., Disp: 30 tablet, Rfl: 2   telmisartan (MICARDIS) 80 MG tablet, Take 1 tablet (80 mg total) by mouth daily., Disp: 30 tablet, Rfl: 0     Vitals       Vitals:    11/04/23 1600  Weight: 60.6 kg  Height: 5\' 1"  (1.549 m)    Body mass index is 25.24 kg/m.   Physical Exam    Constitutional: Appears somewhat ill Psych: Too lethargic to assess Eyes: No scleral injection.  HENT: No OP obstruction.  Cardiovascular: Normal rate and regular rhythm.  Respiratory: Effort normal, non-labored breathing.  GI: Soft.  No distension. There is no tenderness.  Skin: No visible skin lesions   Neurologic Examination      Neuro: Mental Status: Patient is extremely sleepy which does highly limit examination.  At best she opens her eyes to repeated stimulation and after her eyes are open for her keep them open only for a short period of time. Cranial Nerves: II: Visual Fields are difficult to assess due to frequent eye closing but  she does seem to have a left hemianopia. Pupils are equal, round, and reactive to light.   III,IV, VI: Strong left gaze preference.  With VOR will cross fully to the right but will not orient past midline V: Facial sensation is symmetric to eyelash brush VII: Facial movement is notable for left facial droop.  VIII: hearing is intact to voice Motor: Poor effort throughout.  With extensive coaching was able to maintain her right upper extremity antigravity.  All other extremities had brief antigravity effort but quickly hit the bed.  On casual observation did seem very mildly weaker in the left leg compared to the right but she was spontaneously using them both in attempts to reposition herself Sensory: Less reactive to noxious stimulation in the left upper extremity compared the right Cerebellar: Unable to assess secondary to patient's mental status      Labs/Imaging/Neurodiagnostic studies    CBC:  Last Labs      Recent Labs  Lab 11/04/23 1558 11/04/23 1602  WBC 8.2  --   NEUTROABS 6.3  --   HGB 11.2* 11.2*  HCT 32.6* 33.0*  MCV 86.5  --   PLT 215  --       Basic Metabolic Panel:  Recent Labs       Lab Results  Component Value  Date    NA 132 (Rhodes) 11/04/2023    K 3.8 11/04/2023    CO2 20 (Rhodes) 03/28/2023    GLUCOSE 142 (H) 11/04/2023    BUN 25 (H) 11/04/2023    CREATININE 1.30 (H) 11/04/2023    CALCIUM 8.6 (Rhodes) 03/28/2023    GFRNONAA >60 03/28/2023    GFRAA >60 08/18/2019      Lipid Panel:  Recent Labs  No results found for: "LDLCALC"   HgbA1c:  Recent Labs       Lab Results  Component Value Date    HGBA1C 5.8 (H) 03/19/2023        INR  Recent Labs       Lab Results  Component Value Date    INR 1.1 11/04/2023      APTT  Recent Labs       Lab Results  Component Value Date    APTT 32 11/04/2023      AED levels:  Recent Labs  No results found for: "PHENYTOIN", "ZONISAMIDE", "LAMOTRIGINE", "LEVETIRACETA"     CT Head without contrast(Personally  reviewed):  1. No evidence of an acute intracranial abnormality. 2. Parenchymal atrophy and chronic small vessel ischemic disease.   CT angio Head and Neck with contrast(Personally reviewed): CTA neck: 1. The common carotid and internal carotid arteries are patent within the neck without stenosis. Mild atherosclerotic plaque bilaterally, as described. 2. The vertebral arteries are patent within the neck. Non-stenotic atherosclerotic plaque at both vertebral artery origins. Severe narrowing of the V2 left vertebral artery at the C5-C6 level due to degenerative osseous spurring. CTA head: 1. No proximal intracranial large vessel occlusion identified. 2. Moderate stenosis within the right anterior cerebral artery proximal A2 segment. 3. Short-segment fenestration within the mid-to-distal basilar artery (anatomic variant).     ASSESSMENT    OSHEA MAR is a 77 y.o. s/p TNK for clinical concern for stroke causing left-sided weakness and right gaze deviation.  Focal seizure would be in the differential, however no history of the same and no severe electrolyte derangements on this presentation, and husband denies any witnessed seizure-like activity.    Exam concerning for a right MCA syndrome, given no LVO could also consider thalamic localization.   Hypertensive encephalopathy felt to be less likely given strong focality of examination   RECOMMENDATIONS    # Likely stroke determined by clinical assessment - Stroke labs HgbA1c, fasting lipid panel - MRI brain 24 hours post TNK - Frequent neuro checks - Echocardiogram - Hold antiplatelets and Lovenox/heparin for 24 hours until post TNK head imaging completed  - Risk factor modification - Telemetry monitoring - Blood pressure goal              - Post TNK for 24  hours < 180/105, clevidipine ordered and PRN labetalol - PT consult, OT consult, Speech consult, unless patient is back to baseline - Routine EEG given degree of  encephalopathy - Stroke team to follow - 500 cc Normal saline (100 cc/hr x 5 hours) due to AKI and contrast load   # History of hyponatremia with concern for polydipsia -Monitor BMP, consider fluid restriction if needed when tolerating p.o.  # DM2 -Low dose SSI q4hrs until taking PO  -A1c goal < 7%  # ECG abnormalities -Appreciate review by ED provider who feels EKG is stable from prior and recommended checking troponin   # B12 deficiency, with neurological symptoms - B12 1000 mcg IM daily x 7 days, then weekly x 7 weeks, then  1000 mcg PO  - Monitor level outpatient ______________________________________________________________________       Nunzio Cory MD-PhD [facility] [phone] Available 7 AM to 7 PM, outside these hours please contact Neurologist on call listed on AMION    CRITICAL CARE Performed by: Gordy Councilman     Total critical care time: 70 minutes   Critical care time was exclusive of separately billable procedures and treating other patients.   Critical care was necessary to treat or prevent imminent or life-threatening deterioration.   Critical care was time spent personally by me on the following activities: development of treatment plan with patient and/or surrogate as well as nursing, discussions with consultants, evaluation of patient's response to treatment, examination of patient, obtaining history from patient or surrogate, ordering and performing treatments and interventions, ordering and review of laboratory studies, ordering and review of radiographic studies, pulse oximetry and re-evaluation of patient's condition.

## 2023-11-05 NOTE — Progress Notes (Signed)
MB performed impedances check on electrodes. All impedances are below 10k ohms.  [facility] called to inform EEG was not scrolling due to it being stuck on impedances screen. Unit was started under Adult routine EEG vs LTM EEG and was disrupted when it re-booted data at 730 AM. MB re-started EEG under LTM profile.

## 2023-11-05 NOTE — Progress Notes (Addendum)
STROKE TEAM PROGRESS NOTE   BRIEF HPI [name] is a 77 y.o. female with past medical history significant for hypertension, hyperlipidemia, diabetes, retinopathy, anxiety/depression, prolonged QT, recent admission for hyponatremia, mild cognitive impairment on memantine at home who presented 12/5 due to left-sided weakness, increased confusion.  Husband stated that she has had increasing memory problems over the last few months.  B12 was low on admission, patient started on supplementation.   LKW: 130012/5 mRs: 2 TNK given @ 1622 No EVT due to No LVO  NIH on Admission: 20  SIGNIFICANT HOSPITAL EVENTS 12/5: admitted to ICU post TNK administration   INTERIM HISTORY/SUBJECTIVE  Husband at bedisde, son on phone.  Assessment and plan of care thoroughly discussed, all questions answered.  Patient was started on empiric meningitis coverage overnight after spiking a temperature of 102.5.  Patient does not have fever since this reading. UA negative, CXR negative. Will check viral PCR.   Will D/C LTM EEG, showing diffuse slowing, no seizures.  Pending MRI 24hours post-TNK  No focal weakness seen.  Disoriented to day, month and situation.  No aphasia or focal weakness.  OBJECTIVE  CBC    Component Value Date/Time   WBC 8.2 11/04/2023 1558   RBC 3.77 (L) 11/04/2023 1558   HGB 11.2 (L) 11/04/2023 1602   HCT 33.0 (L) 11/04/2023 1602   PLT 215 11/04/2023 1558   MCV 86.5 11/04/2023 1558   MCH 29.7 11/04/2023 1558   MCHC 34.4 11/04/2023 1558   RDW 12.8 11/04/2023 1558   LYMPHSABS 1.1 11/04/2023 1558   MONOABS 0.6 11/04/2023 1558   EOSABS 0.1 11/04/2023 1558   BASOSABS 0.0 11/04/2023 1558    BMET    Component Value Date/Time   NA 132 (L) 11/04/2023 1602   K 3.8 11/04/2023 1602   CL 97 (L) 11/04/2023 1602   CO2 22 11/04/2023 1558   GLUCOSE 142 (H) 11/04/2023 1602   BUN 25 (H) 11/04/2023 1602   CREATININE 1.30 (H) 11/04/2023 1602   CALCIUM 9.3 11/04/2023 1558    GFRNONAA 49 (L) 11/04/2023 1558    IMAGING past 24 hours ECHOCARDIOGRAM COMPLETE  Result Date: 11/05/2023    ECHOCARDIOGRAM REPORT   Patient Name:   Cindy Rhodes Date of Exam: 11/05/2023 Medical Rec #:  [id number]      Height:       61.0 in Accession #:    [id number]     Weight:       127.2 lb Date of Birth:  September 14, 1946       BSA:          1.558 m Patient Age:    77 years       BP:           134/53 mmHg Patient Gender: F              HR:           93 bpm. Exam Location:  Inpatient Procedure: 2D Echo, Cardiac Doppler and Color Doppler Indications:    Stroke I63.9  History:        Patient has prior history of Echocardiogram examinations, most                 recent 03/19/2023. Stroke; Risk Factors:Hypertension, Former                 Smoker and Diabetes.  Sonographer:    Dondra Prader RVT RCS Referring Phys: [id number] Gordy Councilman  Sonographer Comments: Image acquisition  challenging due to respiratory motion. IMPRESSIONS  1. Left ventricular ejection fraction, by estimation, is 60 to 65%. The left ventricle has normal function. The left ventricle has no regional wall motion abnormalities. There is mild concentric left ventricular hypertrophy. Left ventricular diastolic parameters are consistent with Grade I diastolic dysfunction (impaired relaxation).  2. Right ventricular systolic function is normal. The right ventricular size is normal. Tricuspid regurgitation signal is inadequate for assessing PA pressure.  3. The mitral valve is grossly normal. No evidence of mitral valve regurgitation.  4. The aortic valve is tricuspid. Aortic valve regurgitation is not visualized.  5. The inferior vena cava is normal in size with greater than 50% respiratory variability, suggesting right atrial pressure of 3 mmHg. Comparison(s): No significant change from prior study. FINDINGS  Left Ventricle: Left ventricular ejection fraction, by estimation, is 60 to 65%. The left ventricle has normal function. The left ventricle has no  regional wall motion abnormalities. The left ventricular internal cavity size was normal in size. There is  mild concentric left ventricular hypertrophy. Left ventricular diastolic parameters are consistent with Grade I diastolic dysfunction (impaired relaxation). Right Ventricle: The right ventricular size is normal. Right ventricular systolic function is normal. Tricuspid regurgitation signal is inadequate for assessing PA pressure. Left Atrium: Left atrial size was normal in size. Right Atrium: Right atrial size was normal in size. Pericardium: There is no evidence of pericardial effusion. Mitral Valve: The mitral valve is grossly normal. No evidence of mitral valve regurgitation. Tricuspid Valve: Tricuspid valve regurgitation is not demonstrated. Aortic Valve: The aortic valve is tricuspid. Aortic valve regurgitation is not visualized. Aortic valve mean gradient measures 5.0 mmHg. Aortic valve peak gradient measures 9.0 mmHg. Aortic valve area, by VTI measures 1.82 cm. Pulmonic Valve: Pulmonic valve regurgitation is not visualized. Aorta: The aortic root and ascending aorta are structurally normal, with no evidence of dilitation. Venous: The inferior vena cava is normal in size with greater than 50% respiratory variability, suggesting right atrial pressure of 3 mmHg. IAS/Shunts: No atrial level shunt detected by color flow Doppler.  LEFT VENTRICLE PLAX 2D LVIDd:         4.50 cm   Diastology LVIDs:         2.30 cm   LV e' medial:    7.29 cm/s LV PW:         1.10 cm   LV E/e' medial:  7.7 LV IVS:        1.00 cm   LV e' lateral:   6.74 cm/s LVOT diam:     1.80 cm   LV E/e' lateral: 8.4 LV SV:         42 LV SV Index:   27 LVOT Area:     2.54 cm  RIGHT VENTRICLE             IVC RV S prime:     26.10 cm/s  IVC diam: 1.50 cm TAPSE (M-mode): 2.4 cm LEFT ATRIUM             Index        RIGHT ATRIUM          Index LA diam:        3.50 cm 2.25 cm/m   RA Area:     7.55 cm LA Vol (A2C):   37.7 ml 24.16 ml/m  RA Volume:    12.40 ml 7.96 ml/m LA Vol (A4C):   36.9 ml 23.68 ml/m LA Biplane Vol: 44.1 ml 28.30 ml/m  AORTIC VALVE                     PULMONIC VALVE AV Area (Vmax):    1.81 cm      PV Vmax:       1.10 m/s AV Area (Vmean):   1.63 cm      PV Peak grad:  4.8 mmHg AV Area (VTI):     1.82 cm AV Vmax:           150.00 cm/s AV Vmean:          102.250 cm/s AV VTI:            0.231 m AV Peak Grad:      9.0 mmHg AV Mean Grad:      5.0 mmHg LVOT Vmax:         106.50 cm/s LVOT Vmean:        65.400 cm/s LVOT VTI:          0.166 m LVOT/AV VTI ratio: 0.71  AORTA Ao Root diam: 2.60 cm Ao Asc diam:  2.90 cm MITRAL VALVE MV Area (PHT): 5.06 cm    SHUNTS MV Decel Time: 150 msec    Systemic VTI:  0.17 m MV E velocity: 56.30 cm/s  Systemic Diam: 1.80 cm MV A velocity: 91.50 cm/s MV E/A ratio:  0.62 [place] signed by Carolan Clines Signature Date/Time: 11/05/2023/11:52:29 AM    Final    Overnight EEG with video  Result Date: 11/05/2023 Charlsie Quest, MD     11/05/2023 10:34 AM Patient Name: Cindy Rhodes MRN: [id number] Epilepsy Attending: Charlsie Quest Referring Physician/Provider: Gordy Councilman, MD Duration: 11/04/2023 2107 to 11/05/2023 1011 Patient history:  77 y.o. s/p TNK for clinical concern for stroke causing left-sided weakness and right gaze deviation. EEG to evaluate for seizure Level of alertness: lethargic AEDs during EEG study: None Technical aspects: This EEG study was done with scalp electrodes positioned according to the 10-20 International system of electrode placement. Electrical activity was reviewed with band pass filter of 1-70Hz , sensitivity of 7 uV/mm, display speed of 88mm/sec with a 60Hz  notched filter applied as appropriate. EEG data were recorded continuously and digitally stored.  Video monitoring was available and reviewed as appropriate. Description: At the beginning of the study, no clear posterior dominant rhythm was seen.  EEG showed continuous generalized polymorphic sharply  contoured 3 to 6 Hz theta- delta slowing.  Gradually EEG appears improved and showed posterior dominant rhythm of 8 Hz activity of moderate voltage (25-35 uV) seen predominantly in posterior head regions, symmetric and reactive to eye opening and eye closing.  Sleep was characterized by vertex  sleep spindles (12 to 14 Hz), maximal frontocentral region.  EEG also showed continuous generalized 3-7hz  theta-delta slowing.  Hyperventilation and photic stimulation were not performed.   EEG was disconnected between 11/04/2023 2147 to 0209 for testing. ABNORMALITY - Continuous slow, generalized IMPRESSION: This study was initially suggestive of severe diffuse encephalopathy which improved to moderate decrease encephalopathy.  No seizures or definite epileptiform discharges were seen throughout the recording. Priyanka Annabelle Harman   CT ANGIO HEAD NECK W WO CM (CODE STROKE)  Result Date: 11/04/2023 CLINICAL DATA:  Provided history: Neuro deficit, acute, stroke suspected. EXAM: CT ANGIOGRAPHY HEAD AND NECK WITH AND WITHOUT CONTRAST TECHNIQUE: Multidetector CT imaging of the head and neck was performed using the standard protocol during bolus administration of intravenous contrast. Multiplanar CT image reconstructions and MIPs were obtained to  evaluate the vascular anatomy. Carotid stenosis measurements (when applicable) are obtained utilizing NASCET criteria, using the distal internal carotid diameter as the denominator. RADIATION DOSE REDUCTION: This exam was performed according to the departmental dose-optimization program which includes automated exposure control, adjustment of the mA and/or kV according to patient size and/or use of iterative reconstruction technique. CONTRAST:  75mL OMNIPAQUE IOHEXOL 350 MG/ML SOLN COMPARISON:  Noncontrast head CT performed earlier today 11/04/2023. FINDINGS: CTA NECK FINDINGS Aortic arch: Standard aortic branching. Atherosclerotic plaque within the visualized thoracic aorta and proximal  major branch vessels of the neck. Streak/beam hardening artifact arising from a dense left-sided contrast bolus partially obscures the left subclavian artery. Within this limitation, there is no appreciable hemodynamically significant innominate or proximal subclavian artery stenosis. Right carotid system: CCA and ICA patent within the neck without stenosis. Mild atherosclerotic plaque about the carotid bifurcation. Left carotid system: CCA and ICA patent within the neck without stenosis. Mild atherosclerotic plaque about the carotid bifurcation. Vertebral arteries: Codominant and patent within the neck. Mild stenotic plaque at both vertebral artery origins. Severe narrowing of the V2 left vertebral artery at the C5-C6 level due to degenerative osseous spurring (series 5, image 182). Skeleton: Cervical spondylosis. No acute fracture or aggressive osseous lesion. Other neck: No neck mass or cervical lymphadenopathy. Upper chest: No consolidation within the imaged lungs. Review of the MIP images confirms the above findings CTA HEAD FINDINGS Anterior circulation: The intracranial internal carotid arteries are patent. Nonstenotic atherosclerotic plaque within the intracranial right ICA. The M1 middle cerebral arteries are patent. No M2 proximal branch occlusion or high-grade proximal stenosis. The anterior cerebral arteries are patent. Moderate narrowing of the right anterior cerebral artery proximal A2 segment. No intracranial aneurysm is identified. Posterior circulation: The intracranial vertebral arteries are patent. The basilar artery is patent. The posterior cerebral arteries are patent. Short-segment fenestration within the mid-to-distal basilar artery. The posterior cerebral arteries are patent. The posterior cerebral arteries are patent. Posterior communicating arteries are diminutive or absent, bilaterally. Venous sinuses: Within the limitations of contrast timing, no convincing thrombus. Anatomic variants: As  described. Review of the MIP images confirms the above findings No emergent large vessel occlusion identified. These results were communicated to Dr. Iver Nestle at 4:45 pmon 12/5/2024by text page via the [facility] messaging system. IMPRESSION: CTA neck: 1. The common carotid and internal carotid arteries are patent within the neck without stenosis. Mild atherosclerotic plaque bilaterally, as described. 2. The vertebral arteries are patent within the neck. Non-stenotic atherosclerotic plaque at both vertebral artery origins. Severe narrowing of the V2 left vertebral artery at the C5-C6 level due to degenerative osseous spurring. CTA head: 1. No proximal intracranial large vessel occlusion identified. 2. Moderate stenosis within the right anterior cerebral artery proximal A2 segment. 3. Short-segment fenestration within the mid-to-distal basilar artery (anatomic variant). Electronically Signed   By: Jackey Loge D.O.   On: 11/04/2023 16:46   CT HEAD CODE STROKE WO CONTRAST  Result Date: 11/04/2023 CLINICAL DATA:  Code stroke. Provided history: Neuro deficit, acute, stroke suspected. EXAM: CT HEAD WITHOUT CONTRAST TECHNIQUE: Contiguous axial images were obtained from the base of the skull through the vertex without intravenous contrast. RADIATION DOSE REDUCTION: This exam was performed according to the departmental dose-optimization program which includes automated exposure control, adjustment of the mA and/or kV according to patient size and/or use of iterative reconstruction technique. COMPARISON:  Brain MRI 03/18/2023.  Head CT 03/18/2023. FINDINGS: Brain: Generalized cerebral atrophy. Patchy and ill-defined hypoattenuation within the cerebral white  matter, nonspecific but compatible with moderate-to-severe chronic small vessel ischemic disease. There is no acute intracranial hemorrhage. No demarcated cortical infarct. No extra-axial fluid collection. No evidence of an intracranial mass. No midline shift. Vascular: No  hyperdense vessel.  Atherosclerotic calcifications. Skull: No calvarial fracture or aggressive osseous lesion. Sinuses/Orbits: No mass or acute finding within the imaged orbits. No significant paranasal sinus disease. ASPECTS [facility] Stroke Program Early CT Score) - Ganglionic level infarction (caudate, lentiform nuclei, internal capsule, insula, M1-M3 cortex): 7 - Supraganglionic infarction (M4-M6 cortex): 3 Total score (0-10 with 10 being normal): 10 No evidence of an acute intracranial abnormality. These results were communicated to Bhagat at 4:15 pmon 12/5/2024by text page via the [facility] messaging system. IMPRESSION: 1. No evidence of an acute intracranial abnormality. 2. Parenchymal atrophy and chronic small vessel ischemic disease. Electronically Signed   By: Jackey Loge D.O.   On: 11/04/2023 16:17    Vitals:   11/05/23 1045 11/05/23 1100 11/05/23 1111 11/05/23 1300  BP: (!) 134/53 (!) 135/53  (!) 147/57  Pulse: (!) 104 98  96  Resp: 19 18  19   Temp:   98.1 F (36.7 C)   TempSrc:   Axillary   SpO2: 99% 100%  94%  Weight:      Height:         PHYSICAL EXAM General:  Alert, well-nourished, elderly Caucasian lady in no acute distress CV: Regular rate and rhythm on monitor Respiratory:  Regular, unlabored respirations on room air GI: Abdomen soft and non-tender  NEURO:  Mental Status: Disoriented to place, day, month, situation.  She is oriented to self, age, year.  Speech/Language: Mild dysarthria and mild nonfluent speech and occasional word finding difficulty noted.   Cranial Nerves:  II: PERRL.  Decreased left visual field. III, IV, VI: EOMI. Eyelids elevate symmetrically.  V: Sensation is intact to light touch and symmetrical to face.  VII: Face is symmetrical resting and smiling VIII: hearing intact to voice. IX, X: Palate elevates symmetrically. Phonation is normal.  UJ:[id number] shrug is decreased on left  XII: tongue is midline without fasciculations. Motor: 5/5 strength  to all muscle groups tested.  Tone: is normal and bulk is normal Sensation-inconsistent reports of sensory loss to left arm and left leg. Coordination: FTN intact bilaterally, HKS: no ataxia in BLE.No drift.  Gait- deferred  Most Recent NIH: 5    ASSESSMENT/PLAN  Acute Ischemic Infarct vs Stroke-like episode vs encephalopathy s/p TNK Etiology:  pending full work-up  Code Stroke CT head  No acute abnormality.  Small vessel disease.and parenchymal atrophy  CTA head & neck  No LVO Moderate right ACA proximal A2 stenosis MRI: pending 24 hour post-TNK   2D Echo: EF 60 to 65% mild LVH, grade 1 diastolic dysfunction, no shunt Will likely recommend 30-day monitoring on discharge if MRI is positive  LDL No results found for requested labs within last 1095 days. HgbA1c 6.2 VTE prophylaxis - SCDs. If 24-hour post TNK imaging is stable, can start Lovenox. No antithrombotic prior to admission, now on No antithrombotic pending 24-hour post TNK imaging.  If imaging stable, will start aspirin and Plavix for 3 weeks followed by aspirin alone. Therapy recommendations:  Home Health PT and Home Health OT Disposition:  pending  Hypertension Home meds: Amlodipine 5 mg, metoprolol 25 mg, spironolactone 25 mg Add labetalol IVP every 2 hours as needed Cleviprex IV, wean as tolerated Blood Pressure Goal: BP less than 180/105   Hyperlipidemia Home meds: Crestor 2.5 mg LDL  No results found for requested labs within last 1095 days., goal < 70 Statin recommendations will follow once LDL lab resulted  Diabetes type II Uncontrolled Home meds: Metformin 500 mg HgbA1c 6.2, goal < 7.0 CBGs SSI Recommend close follow-up with PCP for better DM control   Dysphagia Patient has post-stroke dysphagia, SLP consulted    Diet   Diet NPO time specified   Advance diet as tolerated  Other Active Problems Dementia Namenda at home  Hospital day # 1   Pt seen by Neuro NP/APP and later by MD. Note/plan  to be edited by MD as needed.    Lynnae January, DNP, AGACNP-BC [facility] Please use AMION for contact information & EPIC for messaging.  STROKE MD NOTE :  I have personally obtained history,examined this patient, reviewed notes, independently viewed imaging studies, participated in medical decision making and plan of care.ROS completed by me personally and pertinent positives fully documented  I have made any additions or clarifications directly to the above note. Agree with note above.  Patient presented with sudden onset of confusion and speech difficulties with some transient left-sided weakness and right gaze deviation felt to be a strokelike episode and treated with IV TNK and seems to have shown significant improvement now but still has mild remaining disorientation and confusion.  History as per family patient does have history of mild cognitive impairment and memory loss at baseline.  No prior history of stroke.  Continue close neurological observation and strict blood pressure control as per post TNK protocol.  Mobilize out of bed.  Therapy consults.  Check MRI scan of the brain, echocardiogram and ongoing stroke workup and therapy consults.  EEG shows no definite seizures.  Replace vitamin B12. This patient is critically ill and at significant risk of neurological worsening, death and care requires constant monitoring of vital signs, hemodynamics,respiratory and cardiac monitoring, extensive review of multiple databases, frequent neurological assessment, discussion with family, other specialists and medical decision making of high complexity.I have made any additions or clarifications directly to the above note.This critical care time does not reflect procedure time, or teaching time or supervisory time of PA/NP/Med Resident etc but could involve care discussion time.  I spent 30 minutes of neurocritical care time  in the care of  this patient.     Delia Heady, MD Medical  Director [facility] Pager: [phone] 11/05/2023 2:54 PM  To contact Stroke Continuity provider, please refer to [email or web address]. After hours, contact General Neurology

## 2023-11-05 NOTE — Evaluation (Signed)
Physical Therapy Evaluation Patient Details Name: Cindy Rhodes MRN: [id number] DOB: [birth year] Today's Date: 11/05/2023  History of Present Illness  Cindy Rhodes is a 77 y.o. woman admitted 12/5 with left-sided weakness, CT (-), TNK administered. PHMx: hypertension, hyperlipidemia, diabetes complicated by retinopathy, anxiety/depression, prolonged QT, recent admissions for hyponatremia, mild cognitive impairment.  Clinical Impression  Pt admitted with above diagnosis. Independent PTA, living at ILF with husband. Requires up to min assist with mobility today, able to take several steps to recliner. Worked on standing balance, pre-gait activities, and transfer techniques. Demonstrates impaired balance, posterior instability requiring intermittent min assist to correct, but gradually improved with practice. Likely to benefit from RW at d/c. Pt currently with functional limitations due to the deficits listed below (see PT Problem List). Pt will benefit from acute skilled PT to increase their independence and safety with mobility to allow discharge.          If plan is discharge home, recommend the following: A little help with walking and/or transfers;A little help with bathing/dressing/bathroom;Assistance with cooking/housework;Direct supervision/assist for medications management;Direct supervision/assist for financial management;Assist for transportation;Supervision due to cognitive status   Can travel by private vehicle        Equipment Recommendations Rolling walker (2 wheels)  Recommendations for Other Services       Functional Status Assessment Patient has had a recent decline in their functional status and demonstrates the ability to make significant improvements in function in a reasonable and predictable amount of time.     Precautions / Restrictions Precautions Precautions: Fall Restrictions Weight Bearing Restrictions: No      Mobility  Bed Mobility Overal bed mobility:  Needs Assistance Bed Mobility: Supine to Sit     Supine to sit: Min assist     General bed mobility comments: HOB flat, no rail, cues for technique and min assist for pt to pull through therapists hand with her RUE.    Transfers Overall transfer level: Needs assistance Equipment used: 1 person hand held assist Transfers: Sit to/from Stand, Bed to chair/wheelchair/BSC Sit to Stand: Min assist   Step pivot transfers: Min assist       General transfer comment: Min assist for boost and balance from EOB, bil hand held support from therapist. Min assist for step pivot with hand held support for balance. Leans posteriorly.    Ambulation/Gait Ambulation/Gait assistance: Min assist Gait Distance (Feet): 3 Feet Assistive device: 1 person hand held assist Gait Pattern/deviations: Decreased stride length, Step-through pattern, Shuffle, Leaning posteriorly Gait velocity: dec   Pre-gait activities: weight shift, progressed to static march General Gait Details: small shuffled steps, posterior lean, min assist for balance.  Stairs            Wheelchair Mobility     Tilt Bed    Modified Rankin (Stroke Patients Only)       Balance Overall balance assessment: Needs assistance Sitting-balance support: No upper extremity supported, Feet supported Sitting balance-Leahy Scale: Fair     Standing balance support: Single extremity supported, During functional activity Standing balance-Leahy Scale: Poor                               Pertinent Vitals/Pain Pain Assessment Pain Assessment: No/denies pain    Home Living Family/patient expects to be discharged to:: Private residence Living Arrangements: Spouse/significant other Available Help at Discharge: Family;Available 24 hours/day Type of Home: Independent living facility Home Access: Elevator  Home Layout: One level Home Equipment: Cane - single point Additional Comments: staff provides cleaning,  meals in dining room. no stairs to enter. Apartment on second floor, but has elevator. Walk in shower with grab bars, no seat. Handicapped height toilet.    Prior Function Prior Level of Function : Independent/Modified Independent             Mobility Comments: Has SPC, has gone to PT recently but did not continue. ADLs Comments: husband drives. indep in medication management for both herself and her husband. Husband does the finances.     Extremity/Trunk Assessment   Upper Extremity Assessment Upper Extremity Assessment: Defer to OT evaluation    Lower Extremity Assessment Lower Extremity Assessment: Overall WFL for tasks assessed (Gross strength and range WFL, SILT LEs)       Communication   Communication Communication: Difficulty communicating thoughts/reduced clarity of speech  Cognition Arousal: Alert Behavior During Therapy: WFL for tasks assessed/performed Overall Cognitive Status: History of cognitive impairments - at baseline                                 General Comments: h/o of mild cognitive impairment. A & O x3 (could not tell us why she was in the hospital); while testing her vision the pen color she was suppose to be looking for got all the way in her opposite visual field and then she said "oh, what am I suppose to be looking for"--the 2nd attempt she got it right away        General Comments General comments (skin integrity, edema, etc.): VSS throughout session98% on 2L supplemental O2, 97% on RA. BP 157/57, HR 99    Exercises     Assessment/Plan    PT Assessment Patient needs continued PT services  PT Problem List Decreased activity tolerance;Decreased balance;Decreased mobility;Decreased coordination;Decreased cognition;Decreased knowledge of use of DME;Decreased safety awareness;Decreased knowledge of precautions       PT Treatment Interventions DME instruction;Gait training;Functional mobility training;Therapeutic  activities;Balance training;Therapeutic exercise;Neuromuscular re-education;Cognitive remediation;Patient/family education    PT Goals (Current goals can be found in the Care Plan section)  Acute Rehab PT Goals Patient Stated Goal: go home PT Goal Formulation: With patient/family Time For Goal Achievement: 11/19/23 Potential to Achieve Goals: Good    Frequency Min 1X/week     Co-evaluation PT/OT/SLP Co-Evaluation/Treatment: Yes Reason for Co-Treatment: For patient/therapist safety;To address functional/ADL transfers;Necessary to address cognition/behavior during functional activity PT goals addressed during session: Mobility/safety with mobility;Balance OT goals addressed during session: Strengthening/ROM;ADL's and self-care       AM-PAC PT "6 Clicks" Mobility  Outcome Measure Help needed turning from your back to your side while in a flat bed without using bedrails?: A Little Help needed moving from lying on your back to sitting on the side of a flat bed without using bedrails?: A Little Help needed moving to and from a bed to a chair (including a wheelchair)?: A Little Help needed standing up from a chair using your arms (e.g., wheelchair or bedside chair)?: A Little Help needed to walk in hospital room?: A Little Help needed climbing 3-5 steps with a railing? : A Lot 6 Click Score: 17    End of Session Equipment Utilized During Treatment: Gait belt Activity Tolerance: Patient tolerated treatment well Patient left: in chair;with call bell/phone within reach;with chair alarm set;with family/visitor present   PT Visit Diagnosis: Unsteadiness on feet (R26.81);History of falling (Z91.81);Difficulty  in walking, not elsewhere classified (R26.2);Other symptoms and signs involving the nervous system (R29.898)    Time: 4401-0272 PT Time Calculation (min) (ACUTE ONLY): 24 min   Charges:   PT Evaluation $PT Eval Low Complexity: 1 Low   PT General Charges $$ ACUTE PT VISIT: 1  Visit         Kathlyn Sacramento, PT, DPT [facility]  Rehabilitation Services Physical Therapist Office: [phone] Website: [email or web address]   Berton Mount 11/05/2023, 2:29 PM

## 2023-11-05 NOTE — Progress Notes (Signed)
*  PRELIMINARY RESULTS* Echocardiogram 2D Echocardiogram has been performed.  Cindy Rhodes 11/05/2023, 11:41 AM

## 2023-11-05 NOTE — Progress Notes (Signed)
PT Cancellation Note  Patient Details Name: Cindy Rhodes MRN: [id number] DOB: 26-Apr-1946   Cancelled Treatment:    Reason Eval/Treat Not Completed: Patient not medically ready  Patient on bedrest due to TNK given at 16:22 on 11/04/2023. Will attempt to see after 24hr window this evening, as schedule permits. If unable to return today, will follow-up for comprehensive physical therapy evaluation tomorrow morning.  Kathlyn Sacramento, PT, DPT [facility]  Rehabilitation Services Physical Therapist Office: [phone] Website: [email or web address]  Berton Mount 11/05/2023, 8:34 AM

## 2023-11-05 NOTE — Progress Notes (Signed)
LTM EEG discontinued by MB - no skin breakdown at [facility].

## 2023-11-05 NOTE — Procedures (Addendum)
Patient Name: Cindy Rhodes  MRN: [id number]  Epilepsy Attending: Charlsie Quest  Referring Physician/Provider: Gordy Councilman, MD  Duration: 11/04/2023 2107 to 11/05/2023 1011  Patient history:  77 y.o. s/p TNK for clinical concern for stroke causing left-sided weakness and right gaze deviation. EEG to evaluate for seizure  Level of alertness: lethargic   AEDs during EEG study: None  Technical aspects: This EEG study was done with scalp electrodes positioned according to the 10-20 International system of electrode placement. Electrical activity was reviewed with band pass filter of 1-70Hz , sensitivity of 7 uV/mm, display speed of 12mm/sec with a 60Hz  notched filter applied as appropriate. EEG data were recorded continuously and digitally stored.  Video monitoring was available and reviewed as appropriate.  Description: At the beginning of the study, no clear posterior dominant rhythm was seen.  EEG showed continuous generalized polymorphic sharply contoured 3 to 6 Hz theta- delta slowing.  Gradually EEG appears improved and showed posterior dominant rhythm of 8 Hz activity of moderate voltage (25-35 uV) seen predominantly in posterior head regions, symmetric and reactive to eye opening and eye closing.  Sleep was characterized by vertex  sleep spindles (12 to 14 Hz), maximal frontocentral region.  EEG also showed continuous generalized 3-7hz  theta-delta slowing.  Hyperventilation and photic stimulation were not performed.     EEG was disconnected between 11/04/2023 2147 to 0209 for testing.  ABNORMALITY - Continuous slow, generalized  IMPRESSION: This study was initially suggestive of severe diffuse encephalopathy which improved to moderate decrease encephalopathy.  No seizures or definite epileptiform discharges were seen throughout the recording.   Brittni Hult Annabelle Harman

## 2023-11-06 ENCOUNTER — Inpatient Hospital Stay (HOSPITAL_COMMUNITY): Payer: PPO

## 2023-11-06 ENCOUNTER — Other Ambulatory Visit (HOSPITAL_COMMUNITY): Payer: PPO

## 2023-11-06 DIAGNOSIS — G459 Transient cerebral ischemic attack, unspecified: Secondary | ICD-10-CM | POA: Diagnosis not present

## 2023-11-06 LAB — BASIC METABOLIC PANEL
Anion gap: 14 (ref 5–15)
BUN: 13 mg/dL (ref 8–23)
CO2: 19 mmol/L — ABNORMAL LOW (ref 22–32)
Calcium: 8.1 mg/dL — ABNORMAL LOW (ref 8.9–10.3)
Chloride: 101 mmol/L (ref 98–111)
Creatinine, Ser: 0.88 mg/dL (ref 0.44–1.00)
GFR, Estimated: >60 mL/min (ref >60)
Glucose, Bld: 128 mg/dL — ABNORMAL HIGH (ref 70–99)
Potassium: 2.8 mmol/L — ABNORMAL LOW (ref 3.5–5.1)
Sodium: 134 mmol/L — ABNORMAL LOW (ref 135–145)

## 2023-11-06 LAB — CBC
HCT: 32.2 % — ABNORMAL LOW (ref 36.0–46.0)
Hemoglobin: 11.7 g/dL — ABNORMAL LOW (ref 12.0–15.0)
MCH: 30.9 pg (ref 26.0–34.0)
MCHC: 36.3 g/dL — ABNORMAL HIGH (ref 30.0–36.0)
MCV: 85 fL (ref 80.0–100.0)
Platelets: 205 10*3/uL (ref 150–400)
RBC: 3.79 MIL/uL — ABNORMAL LOW (ref 3.87–5.11)
RDW: 13 % (ref 11.5–15.5)
WBC: 12.4 10*3/uL — ABNORMAL HIGH (ref 4.0–10.5)
nRBC: 0 % (ref 0.0–0.2)

## 2023-11-06 LAB — GLUCOSE, CAPILLARY
Glucose-Capillary: 131 mg/dL — ABNORMAL HIGH (ref 70–99)
Glucose-Capillary: 132 mg/dL — ABNORMAL HIGH (ref 70–99)
Glucose-Capillary: 170 mg/dL — ABNORMAL HIGH (ref 70–99)
Glucose-Capillary: 125 mg/dL — ABNORMAL HIGH (ref 70–99)
Glucose-Capillary: 202 mg/dL — ABNORMAL HIGH (ref 70–99)

## 2023-11-06 MED ORDER — METOPROLOL TARTRATE 25 MG PO TABS
25.0000 mg | ORAL_TABLET | Freq: Two times a day (BID) | ORAL | Status: DC
  Administered 2023-11-06 – 2023-11-07 (×3): 25 mg via ORAL
  Filled 2023-11-06 (×3): qty 1

## 2023-11-06 MED ORDER — VITAMIN B-12 1000 MCG PO TABS
1000.0000 ug | ORAL_TABLET | Freq: Every day | ORAL | Status: DC
  Administered 2023-11-07: 1000 ug via ORAL
  Filled 2023-11-06: qty 1

## 2023-11-06 MED ORDER — ASPIRIN 81 MG PO TBEC
81.0000 mg | DELAYED_RELEASE_TABLET | Freq: Every day | ORAL | Status: DC
  Administered 2023-11-06 – 2023-11-07 (×2): 81 mg via ORAL
  Filled 2023-11-06 (×2): qty 1

## 2023-11-06 MED ORDER — MEMANTINE HCL 10 MG PO TABS
5.0000 mg | ORAL_TABLET | Freq: Two times a day (BID) | ORAL | Status: DC
  Administered 2023-11-06 – 2023-11-07 (×3): 5 mg via ORAL
  Filled 2023-11-06 (×4): qty 1

## 2023-11-06 MED ORDER — POTASSIUM CHLORIDE CRYS ER 20 MEQ PO TBCR
40.0000 meq | EXTENDED_RELEASE_TABLET | ORAL | Status: AC
  Administered 2023-11-06: 40 meq via ORAL
  Filled 2023-11-06: qty 2

## 2023-11-06 MED ORDER — FAMOTIDINE 20 MG PO TABS
20.0000 mg | ORAL_TABLET | Freq: Every day | ORAL | Status: DC
  Administered 2023-11-06 – 2023-11-07 (×2): 20 mg via ORAL
  Filled 2023-11-06 (×2): qty 1

## 2023-11-06 MED ORDER — SERTRALINE HCL 50 MG PO TABS
50.0000 mg | ORAL_TABLET | Freq: Every day | ORAL | Status: DC
  Administered 2023-11-06 – 2023-11-07 (×2): 50 mg via ORAL
  Filled 2023-11-06 (×2): qty 1

## 2023-11-06 MED ORDER — ROSUVASTATIN CALCIUM 5 MG PO TABS
5.0000 mg | ORAL_TABLET | Freq: Every day | ORAL | Status: DC
  Administered 2023-11-06: 5 mg via ORAL
  Filled 2023-11-06: qty 1

## 2023-11-06 MED ORDER — LABETALOL HCL 5 MG/ML IV SOLN
5.0000 mg | INTRAVENOUS | Status: DC | PRN
Start: 2023-11-06 — End: 2023-11-07

## 2023-11-06 NOTE — Progress Notes (Addendum)
STROKE TEAM PROGRESS NOTE   BRIEF HPI Ms. CHRISTI HEELAN is a 77 y.o. female with past medical history significant for hypertension, hyperlipidemia, diabetes, retinopathy, anxiety/depression, prolonged QT, recent admission for hyponatremia, mild cognitive impairment on memantine at home who presented 12/5 due to left-sided weakness, increased confusion.  Husband stated that she has had increasing memory problems over the last few months.  B12 was low on admission, patient started on supplementation.   SIGNIFICANT HOSPITAL EVENTS 12/5: admitted to ICU post TNK administration 12/6: one time spiking fever, put on meningitis Abx regimen 12/7: afebrile, neuro exam intact. MRI question for left PLIC punctate infarct which is not very convincing.     INTERIM HISTORY/SUBJECTIVE Son at the bedside, who is a GI physician practicing in [place]. Pt sitting at the edge of bed, AAO x 3, neuro intact. One time spiking fever and then afebrile. WBC improving, no HA, no nuchal rigidity, UA, CXR and BCx so far negative.   Pt did not remember what led her come to hospital. Per son, pt had left sided weakness and right gaze. S/p TNK. Now all resolved. MRI no infarct on the right. Question of a punctate PLIC infarct but does not seem to be convincing to me.   Discussed with son who also discussed with pt husband, given low suspicious of meningitis at this time and pt declined LP at this time, we will hold off LP and d/c meningitis Abx, and continue to monitor. Will start ASA and her home meds but not DAPT at this time in case she needs LP.   OBJECTIVE  CBC    Component Value Date/Time   WBC 12.4 (H) 11/06/2023 0617   RBC 3.79 (L) 11/06/2023 0617   HGB 11.7 (L) 11/06/2023 0617   HCT 32.2 (L) 11/06/2023 0617   PLT 205 11/06/2023 0617   MCV 85.0 11/06/2023 0617   MCH 30.9 11/06/2023 0617   MCHC 36.3 (H) 11/06/2023 0617   RDW 13.0 11/06/2023 0617   LYMPHSABS 1.1 11/04/2023 1558   MONOABS 0.6 11/04/2023 1558    EOSABS 0.1 11/04/2023 1558   BASOSABS 0.0 11/04/2023 1558    BMET    Component Value Date/Time   NA 134 (L) 11/06/2023 0617   K 2.8 (L) 11/06/2023 0617   CL 101 11/06/2023 0617   CO2 19 (L) 11/06/2023 0617   GLUCOSE 128 (H) 11/06/2023 0617   BUN 13 11/06/2023 0617   CREATININE 0.88 11/06/2023 0617   CALCIUM 8.1 (L) 11/06/2023 0617   GFRNONAA >60 11/06/2023 0617    IMAGING past 24 hours DG CHEST PORT 1 VIEW  Result Date: 11/06/2023 CLINICAL DATA:  Leukocytosis. EXAM: PORTABLE CHEST 1 VIEW COMPARISON:  03/19/2023 FINDINGS: The lungs are clear without focal pneumonia, edema, pneumothorax or pleural effusion. The cardiopericardial silhouette is within normal limits for size. No acute bony abnormality. Telemetry leads overlie the chest. IMPRESSION: No active disease. Electronically Signed   By: Kennith Center M.D.   On: 11/06/2023 07:47   MR BRAIN WO CONTRAST  Result Date: 11/05/2023 CLINICAL DATA:  Stroke, follow up EXAM: MRI HEAD WITHOUT CONTRAST TECHNIQUE: Multiplanar, multiecho pulse sequences of the brain and surrounding structures were obtained without intravenous contrast. COMPARISON:  Brain MR 03/18/23 FINDINGS: Brain: Punctate acute infarcts in the posterior limb of the internal capsule on the left (series 3, image 24). No hemorrhage. No hydrocephalus. No extra-axial collection. There is background moderate to severe chronic microvascular ischemic change. Chronic microhemorrhage in the anterior right temporal and the near  the right occipital lobe. Vascular: Normal flow voids. Skull and upper cervical spine: Normal marrow signal. Sinuses/Orbits: Small right mastoid effusion. No middle ear effusion. Paranasal sinuses are clear. Orbits are unremarkable Other: None. IMPRESSION: Punctate acute infarct in the posterior limb of the internal capsule on the left. No hemorrhage. These results will be called to the ordering clinician or representative by the Radiologist Assistant, and communication  documented in the PACS or [employer]. Electronically Signed   By: Lorenza Cambridge M.D.   On: 11/05/2023 18:56   ECHOCARDIOGRAM COMPLETE  Result Date: 11/05/2023    ECHOCARDIOGRAM REPORT   Patient Name:   ANNALEIA LEVENGOOD Millman Date of Exam: 11/05/2023 Medical Rec #:  [id number]      Height:       61.0 in Accession #:    [id number]     Weight:       127.2 lb Date of Birth:  March 24, 1946       BSA:          1.558 m Patient Age:    77 years       BP:           134/53 mmHg Patient Gender: F              HR:           93 bpm. Exam Location:  Inpatient Procedure: 2D Echo, Cardiac Doppler and Color Doppler Indications:    Stroke I63.9  History:        Patient has prior history of Echocardiogram examinations, most                 recent 03/19/2023. Stroke; Risk Factors:Hypertension, Former                 Smoker and Diabetes.  Sonographer:    Dondra Prader RVT RCS Referring Phys: [id number] SRISHTI L BHAGAT  Sonographer Comments: Image acquisition challenging due to respiratory motion. IMPRESSIONS  1. Left ventricular ejection fraction, by estimation, is 60 to 65%. The left ventricle has normal function. The left ventricle has no regional wall motion abnormalities. There is mild concentric left ventricular hypertrophy. Left ventricular diastolic parameters are consistent with Grade I diastolic dysfunction (impaired relaxation).  2. Right ventricular systolic function is normal. The right ventricular size is normal. Tricuspid regurgitation signal is inadequate for assessing PA pressure.  3. The mitral valve is grossly normal. No evidence of mitral valve regurgitation.  4. The aortic valve is tricuspid. Aortic valve regurgitation is not visualized.  5. The inferior vena cava is normal in size with greater than 50% respiratory variability, suggesting right atrial pressure of 3 mmHg. Comparison(s): No significant change from prior study. FINDINGS  Left Ventricle: Left ventricular ejection fraction, by estimation, is 60 to 65%. The left  ventricle has normal function. The left ventricle has no regional wall motion abnormalities. The left ventricular internal cavity size was normal in size. There is  mild concentric left ventricular hypertrophy. Left ventricular diastolic parameters are consistent with Grade I diastolic dysfunction (impaired relaxation). Right Ventricle: The right ventricular size is normal. Right ventricular systolic function is normal. Tricuspid regurgitation signal is inadequate for assessing PA pressure. Left Atrium: Left atrial size was normal in size. Right Atrium: Right atrial size was normal in size. Pericardium: There is no evidence of pericardial effusion. Mitral Valve: The mitral valve is grossly normal. No evidence of mitral valve regurgitation. Tricuspid Valve: Tricuspid valve regurgitation is not demonstrated. Aortic Valve: The aortic valve is tricuspid.  Aortic valve regurgitation is not visualized. Aortic valve mean gradient measures 5.0 mmHg. Aortic valve peak gradient measures 9.0 mmHg. Aortic valve area, by VTI measures 1.82 cm. Pulmonic Valve: Pulmonic valve regurgitation is not visualized. Aorta: The aortic root and ascending aorta are structurally normal, with no evidence of dilitation. Venous: The inferior vena cava is normal in size with greater than 50% respiratory variability, suggesting right atrial pressure of 3 mmHg. IAS/Shunts: No atrial level shunt detected by color flow Doppler.  LEFT VENTRICLE PLAX 2D LVIDd:         4.50 cm   Diastology LVIDs:         2.30 cm   LV e' medial:    7.29 cm/s LV PW:         1.10 cm   LV E/e' medial:  7.7 LV IVS:        1.00 cm   LV e' lateral:   6.74 cm/s LVOT diam:     1.80 cm   LV E/e' lateral: 8.4 LV SV:         42 LV SV Index:   27 LVOT Area:     2.54 cm  RIGHT VENTRICLE             IVC RV S prime:     26.10 cm/s  IVC diam: 1.50 cm TAPSE (M-mode): 2.4 cm LEFT ATRIUM             Index        RIGHT ATRIUM          Index LA diam:        3.50 cm 2.25 cm/m   RA Area:      7.55 cm LA Vol (A2C):   37.7 ml 24.16 ml/m  RA Volume:   12.40 ml 7.96 ml/m LA Vol (A4C):   36.9 ml 23.68 ml/m LA Biplane Vol: 44.1 ml 28.30 ml/m  AORTIC VALVE                     PULMONIC VALVE AV Area (Vmax):    1.81 cm      PV Vmax:       1.10 m/s AV Area (Vmean):   1.63 cm      PV Peak grad:  4.8 mmHg AV Area (VTI):     1.82 cm AV Vmax:           150.00 cm/s AV Vmean:          102.250 cm/s AV VTI:            0.231 m AV Peak Grad:      9.0 mmHg AV Mean Grad:      5.0 mmHg LVOT Vmax:         106.50 cm/s LVOT Vmean:        65.400 cm/s LVOT VTI:          0.166 m LVOT/AV VTI ratio: 0.71  AORTA Ao Root diam: 2.60 cm Ao Asc diam:  2.90 cm MITRAL VALVE MV Area (PHT): 5.06 cm    SHUNTS MV Decel Time: 150 msec    Systemic VTI:  0.17 m MV E velocity: 56.30 cm/s  Systemic Diam: 1.80 cm MV A velocity: 91.50 cm/s MV E/A ratio:  0.62 Photographer signed by Carolan Clines Signature Date/Time: 11/05/2023/11:52:29 AM    Final     Vitals:   11/06/23 0615 11/06/23 0630 11/06/23 0645 11/06/23 0709  BP: (!) 156/130 (!) 161/86 (!) 168/81  Pulse: 91 87 77   Resp: 20 18 11    Temp:    98.8 F (37.1 C)  TempSrc:    Axillary  SpO2: 98% 98% 98%   Weight:      Height:         PHYSICAL EXAM General:  Alert, well-nourished, elderly Caucasian lady in no acute distress CV: Regular rate and rhythm on monitor Respiratory:  Regular, unlabored respirations on room air GI: Abdomen soft and non-tender  NEURO:  Mental Status: awake alert, oriented x 3.  Speech/Language: no aphasia, or dysarthria, intact language  Cranial Nerves:  II: PERRL. Visual field full. III, IV, VI: EOMI. Eyelids elevate symmetrically.  V: Sensation is intact to light touch and symmetrical to face.  VII: Face is symmetrical resting and smiling VIII: hearing intact to voice. IX, X: Palate elevates symmetrically. Phonation is normal.  ZO:[id number] shrug is decreased on left  XII: tongue is midline without fasciculations. Motor:  5/5 strength to all muscle groups tested.  Tone: is normal and bulk is normal Sensation- symmetrical, no sensory neglect Coordination: FTN intact bilaterally, HKS: no ataxia in BLE.No drift.  Gait- deferred  NIH: 0    ASSESSMENT/PLAN  Stroke like symptoms s/p TNK TIA vs. Aborted stroke vs. encephalopathy, etiology unclear, needs to rule out cardioembolic source Code Stroke CT head No acute abnormality. Small vessel disease.and parenchymal atrophy  CTA head & neck No LVO, L V2 severe and L A2 moderate stenosis MRI: ? Punctate left PLIC infarct but not convincing to me at all. moderate to severe chronic microvascular ischemic change.  2D Echo: EF 60 to 65% mild LVH, grade 1 diastolic dysfunction, no shunt Will likely recommend 30-day monitoring on discharge LDL 73 HgbA1c 6.2 UDS neg VTE prophylaxis - SCDs No antithrombotic prior to admission, now on ASA 81. Hold off DAPT for now in case she needs LP.  Therapy recommendations:  Home Health PT and Home Health OT Disposition:  pending  ? Meningitis  Confusion but no HA One time spiking fever 102.5 but then afebrile On empiric meningitis regimen with acyclovir, ampicillin, rocephin and vanco UA neg, CXR neg BCx NGTD Pt declined LP at this time Rapid neuro improvement Low suspicious for meningitis at this time.  Discussed with son who also discussed with pt husband, will hold off LP for now and d/c meningitis Abx, and continue to monitor.   Hypertension Home meds: Amlodipine 5 mg, metoprolol 25 mg, spironolactone 25 mg Add labetalol as needed Cleviprex IV off On home norvasc, metoprolol and spironolactone Blood Pressure Goal: BP less than 180/105  Long term BP goal normotensive  Hyperlipidemia Home meds: Crestor 2.5 mg LDL 73, goal < 70 Increase crestor to 5mg  No high intensity statin due to LDL near goal Continue statin on discharge  Diabetes type II Uncontrolled Home meds: Metformin 500 mg HgbA1c 6.2, goal <  7.0 CBGs SSI Recommend close follow-up with PCP for better DM control  Other Active Problems Dementia, Namenda at home, resume Mild hyponatremia, Na 134 Anxiety  Hospital day # 2  This patient is critically ill due to stroke symptoms s/p TNK, questionable CNS infection and at significant risk of neurological worsening, death form bleeding from TNK, recurrent stroke / TIA, CNS infection, sepsis. This patient's care requires constant monitoring of vital signs, hemodynamics, respiratory and cardiac monitoring, review of multiple databases, neurological assessment, discussion with family, other specialists and medical decision making of high complexity. I spent 40 minutes of neurocritical care time in the care  of this patient. I had long discussion with Son at bedside, updated pt current condition, treatment plan and potential prognosis, and answered all the questions. He expressed understanding and appreciation.   Marvel Plan, MD PhD Stroke Neurology 11/06/2023 10:44 AM    To contact Stroke Continuity provider, please refer to [email or web address]. After hours, contact General Neurology

## 2023-11-06 NOTE — Progress Notes (Signed)
Family politely requests minimizing vital checks, cbg and NIHSS to promote restful sleep. Okay to hold off on vital checks and NIHSS from midnight to 5AM.  Erick Blinks [facility]

## 2023-11-06 NOTE — Progress Notes (Signed)
Physical Therapy Treatment Patient Details Name: Cindy Rhodes MRN: [id number] DOB: 06/27/1946 Today's Date: 11/06/2023   History of Present Illness Cindy Rhodes is a 77 y.o. woman admitted 12/5 with left-sided weakness, CT (-), TNK administered. PHMx: hypertension, hyperlipidemia, diabetes complicated by retinopathy, anxiety/depression, prolonged QT, recent admissions for hyponatremia, mild cognitive impairment.    PT Comments  Good progress today, balance gradually improving. With RW pt demonstrates mild sway but able to self correct. CGA for safety with gait, intermittent scissoring noted but no buckling or posterior LOB today. VSS throughout. Educated on [employer], handout provided. Family present and very supportive. Patient will continue to benefit from skilled physical therapy services to further improve independence with functional mobility.     If plan is discharge home, recommend the following: A little help with walking and/or transfers;A little help with bathing/dressing/bathroom;Assistance with cooking/housework;Direct supervision/assist for medications management;Direct supervision/assist for financial management;Assist for transportation;Supervision due to cognitive status   Can travel by private vehicle        Equipment Recommendations  Rolling walker (2 wheels)    Recommendations for Other Services       Precautions / Restrictions Precautions Precautions: Fall Restrictions Weight Bearing Restrictions: No     Mobility  Bed Mobility Overal bed mobility: Needs Assistance Bed Mobility: Supine to Sit     Supine to sit: Contact guard     General bed mobility comments: CGA for safety, requires VC to facilitate and sequence to EOB.    Transfers Overall transfer level: Needs assistance Equipment used: Rolling walker (2 wheels) Transfers: Sit to/from Stand Sit to Stand: Contact guard assist           General transfer comment: CGA for safety from ICU bed (tall  surface.) Minimal sway with RW for support today, no overt posterior LOB.    Ambulation/Gait Ambulation/Gait assistance: Contact guard assist Gait Distance (Feet): 80 Feet Assistive device: Rolling walker (2 wheels) Gait Pattern/deviations: Decreased stride length, Step-through pattern, Shuffle, Scissoring Gait velocity: dec Gait velocity interpretation: <1.31 ft/sec, indicative of household ambulator   General Gait Details: CGA for safety, educated on proper AD use with RW for support. Able to self correct mild instability. Requires cues for awareness and to correct intermittent scissoring. VSS throughout. SpO2 100% on RA.   Stairs             Wheelchair Mobility     Tilt Bed    Modified Rankin (Stroke Patients Only)       Balance Overall balance assessment: Needs assistance Sitting-balance support: No upper extremity supported, Feet supported Sitting balance-Leahy Scale: Fair     Standing balance support: During functional activity, No upper extremity supported Standing balance-Leahy Scale: Fair Standing balance comment: More stable with RW                            Cognition Arousal: Alert Behavior During Therapy: WFL for tasks assessed/performed Overall Cognitive Status: History of cognitive impairments - at baseline                                 General Comments: h/o of mild cognitive impairment        Exercises Other Exercises Other Exercises: Access Code: [email or web address]  URL: [email or web address]  Date: 11/06/2023  Prepared by: Kathlyn Sacramento    Exercises  - Seated Long Arc Quad  - 3-5 x daily - 7 x  weekly - 1-3 sets - 10 reps  - Supine Ankle Pumps  - 3-5 x daily - 7 x weekly - 1-3 sets - 10 reps  - Supine Gluteal Sets  - 3-5 x daily - 7 x weekly - 1-3 sets - 10 reps  - Supine Quadricep Sets  - 3-5 x daily - 7 x weekly - 1-3 sets - 10 reps  - Supine Active Straight Leg Raise  - 3-5 x daily - 7 x weekly - 1-3 sets - 10  reps    General Comments General comments (skin integrity, edema, etc.): VSS      Pertinent Vitals/Pain Pain Assessment Pain Assessment: No/denies pain    Home Living                          Prior Function            PT Goals (current goals can now be found in the care plan section) Acute Rehab PT Goals Patient Stated Goal: go home PT Goal Formulation: With patient/family Time For Goal Achievement: 11/19/23 Potential to Achieve Goals: Good Progress towards PT goals: Progressing toward goals    Frequency    Min 1X/week      PT Plan      Co-evaluation              AM-PAC PT "6 Clicks" Mobility   Outcome Measure  Help needed turning from your back to your side while in a flat bed without using bedrails?: A Little Help needed moving from lying on your back to sitting on the side of a flat bed without using bedrails?: A Little Help needed moving to and from a bed to a chair (including a wheelchair)?: A Little Help needed standing up from a chair using your arms (e.g., wheelchair or bedside chair)?: A Little Help needed to walk in hospital room?: A Little Help needed climbing 3-5 steps with a railing? : A Lot 6 Click Score: 17    End of Session Equipment Utilized During Treatment: Gait belt Activity Tolerance: Patient tolerated treatment well Patient left: in chair;with call bell/phone within reach;with chair alarm set;with family/visitor present;with SCD's reapplied Nurse Communication: Mobility status PT Visit Diagnosis: Unsteadiness on feet (R26.81);History of falling (Z91.81);Difficulty in walking, not elsewhere classified (R26.2);Other symptoms and signs involving the nervous system (R29.898)     Time: 7425-9563 PT Time Calculation (min) (ACUTE ONLY): 29 min  Charges:    $Gait Training: 8-22 mins $Therapeutic Exercise: 8-22 mins PT General Charges $$ ACUTE PT VISIT: 1 Visit                     Kathlyn Sacramento, PT, DPT [facility]   Rehabilitation Services Physical Therapist Office: [phone] Website: [email or web address]    Berton Mount 11/06/2023, 12:02 PM

## 2023-11-06 NOTE — Progress Notes (Signed)
Routine CXR order placed for leukocytosis. Family requests to wait until morning due to non-urgent matter and to promote day/night cycle for pt. This RN informed radiology tech to wait until morning x-ray time to obtain.

## 2023-11-07 DIAGNOSIS — G459 Transient cerebral ischemic attack, unspecified: Secondary | ICD-10-CM | POA: Diagnosis not present

## 2023-11-07 LAB — BASIC METABOLIC PANEL
Anion gap: 10 (ref 5–15)
BUN: 9 mg/dL (ref 8–23)
CO2: 22 mmol/L (ref 22–32)
Calcium: 8.8 mg/dL — ABNORMAL LOW (ref 8.9–10.3)
Chloride: 103 mmol/L (ref 98–111)
Creatinine, Ser: 0.98 mg/dL (ref 0.44–1.00)
GFR, Estimated: 60 mL/min — ABNORMAL LOW (ref >60)
Glucose, Bld: 131 mg/dL — ABNORMAL HIGH (ref 70–99)
Potassium: 2.7 mmol/L — CL (ref 3.5–5.1)
Sodium: 135 mmol/L (ref 135–145)

## 2023-11-07 LAB — CBC
HCT: 32.5 % — ABNORMAL LOW (ref 36.0–46.0)
Hemoglobin: 11.4 g/dL — ABNORMAL LOW (ref 12.0–15.0)
MCH: 30.2 pg (ref 26.0–34.0)
MCHC: 35.1 g/dL (ref 30.0–36.0)
MCV: 86.2 fL (ref 80.0–100.0)
Platelets: 203 10*3/uL (ref 150–400)
RBC: 3.77 MIL/uL — ABNORMAL LOW (ref 3.87–5.11)
RDW: 13 % (ref 11.5–15.5)
WBC: 9.2 10*3/uL (ref 4.0–10.5)
nRBC: 0 % (ref 0.0–0.2)

## 2023-11-07 LAB — GLUCOSE, CAPILLARY
Glucose-Capillary: 127 mg/dL — ABNORMAL HIGH (ref 70–99)
Glucose-Capillary: 274 mg/dL — ABNORMAL HIGH (ref 70–99)
Glucose-Capillary: 81 mg/dL (ref 70–99)

## 2023-11-07 MED ORDER — POTASSIUM CHLORIDE CRYS ER 20 MEQ PO TBCR: 40.0000 meq | EXTENDED_RELEASE_TABLET | Freq: Two times a day (BID) | ORAL | 0 refills | Status: DC

## 2023-11-07 MED ORDER — ROSUVASTATIN CALCIUM 5 MG PO TABS: 5.0000 mg | ORAL_TABLET | Freq: Every day | ORAL | 2 refills | Status: AC

## 2023-11-07 MED ORDER — ASPIRIN 81 MG PO TBEC: 81.0000 mg | DELAYED_RELEASE_TABLET | Freq: Every day | ORAL | 1 refills | Status: AC

## 2023-11-07 MED ORDER — CLOPIDOGREL BISULFATE 75 MG PO TABS: 75.0000 mg | ORAL_TABLET | Freq: Every day | ORAL | 0 refills | Status: DC

## 2023-11-07 MED ORDER — MAGNESIUM SULFATE 2 GM/50ML IV SOLN
2.0000 g | Freq: Once | INTRAVENOUS | Status: AC
  Administered 2023-11-07: 2 g via INTRAVENOUS
  Filled 2023-11-07: qty 50

## 2023-11-07 MED ORDER — CYANOCOBALAMIN 1000 MCG PO TABS: 1000.0000 ug | ORAL_TABLET | Freq: Every day | ORAL | 1 refills | Status: AC

## 2023-11-07 MED ORDER — POTASSIUM CHLORIDE CRYS ER 20 MEQ PO TBCR
40.0000 meq | EXTENDED_RELEASE_TABLET | ORAL | Status: DC
  Administered 2023-11-07: 40 meq via ORAL
  Filled 2023-11-07: qty 2

## 2023-11-07 NOTE — Progress Notes (Signed)
Patient family members refused vital signs, glucose monitoring and NIH to give the patient time to rest. Notified Dr. Derry Lory.

## 2023-11-07 NOTE — Plan of Care (Signed)
  Problem: Nutrition: Goal: Risk of aspiration will decrease Outcome: Progressing   Problem: Activity: Goal: Risk for activity intolerance will decrease Outcome: Progressing

## 2023-11-07 NOTE — Discharge Summary (Signed)
Stroke Discharge Summary  Patient ID: chole rion   MRN: [id number]      DOB: 1946-08-06  Date of Admission: 11/04/2023 Date of Discharge: 11/07/2023  Attending Physician:  Stroke, Md, MD  Consultant(s):    None  Patient's PCP:  Renford Dills, MD  DISCHARGE DIAGNOSIS:  Stroke like symptoms s/p TNK TIA vs. Aborted stroke vs. encephalopathy, etiology unclear, needs to rule out cardioembolic source  Other Principal Problem: Ruled out meningitis HTN HLD DM Dementia  Hypokalemia    Allergies as of 11/07/2023       Reactions   Hydrochlorothiazide Other (See Comments)   Unknown    Meloxicam Other (See Comments)   Dizzy and nervous    Pork-derived Products Other (See Comments)   Religious    Sulfa Antibiotics Other (See Comments)   Shuts down urinary system        Medication List     TAKE these medications    amLODipine 5 MG tablet Commonly known as: NORVASC Take 1 tablet (5 mg total) by mouth daily.   aspirin EC 81 MG tablet Take 1 tablet (81 mg total) by mouth daily. Swallow whole. Start taking on: November 08, 2023   Calcium Carbonate-Vitamin D 600-400 MG-UNIT tablet Take 1 tablet by mouth daily.   clopidogrel 75 MG tablet Commonly known as: Plavix Take 1 tablet (75 mg total) by mouth daily.   CO Q 10 PO Take 1 capsule by mouth daily.   cyanocobalamin 1000 MCG tablet Take 1 tablet (1,000 mcg total) by mouth daily. Start taking on: November 08, 2023   famotidine 20 MG tablet Commonly known as: PEPCID Take 20 mg by mouth daily.   glucosamine-chondroitin 500-400 MG tablet Take 3 tablets by mouth daily.   memantine 5 MG tablet Commonly known as: NAMENDA TAKE 1 TABLET(5 MG) BY MOUTH EVERY NIGHT FOR 2 WEEKS, THEN INCREASE TO TAKE 1 TABLET (5 MG) BY MOUTH 2 TIMES A DAY   metFORMIN 500 MG 24 hr tablet Commonly known as: GLUCOPHAGE-XR Take 1,000 mg by mouth 2 (two) times daily.   metoprolol tartrate 25 MG tablet Commonly known as: LOPRESSOR Take  25 mg by mouth 2 (two) times daily.   multivitamin with minerals Tabs tablet Take 1 tablet by mouth daily.   oxybutynin 10 MG 24 hr tablet Commonly known as: DITROPAN-XL Take 10 mg by mouth daily.   potassium chloride SA 20 MEQ tablet Commonly known as: KLOR-CON M Take 2 tablets (40 mEq total) by mouth 2 (two) times daily for 2 days.   rosuvastatin 5 MG tablet Commonly known as: Crestor Take 1 tablet (5 mg total) by mouth at bedtime. What changed: how much to take   sertraline 50 MG tablet Commonly known as: ZOLOFT Take 50 mg by mouth daily.   spironolactone 25 MG tablet Commonly known as: ALDACTONE Take 0.5 tablets (12.5 mg total) by mouth daily.   Tart Cherry Advanced Caps Take 1 capsule by mouth daily.   telmisartan 80 MG tablet Commonly known as: MICARDIS Take 1 tablet (80 mg total) by mouth daily.   Vitamin C 500 MG Caps Take 500 mg by mouth daily.               Durable Medical Equipment  (From admission, onward)           Start     Ordered   11/07/23 1352  For home use only DME Walker rolling  Once  Question Answer Comment  Walker: With 5 Inch Wheels   Patient needs a walker to treat with the following condition Weakness      11/07/23 1351            LABORATORY STUDIES CBC    Component Value Date/Time   WBC 9.2 11/07/2023 0655   RBC 3.77 (L) 11/07/2023 0655   HGB 11.4 (L) 11/07/2023 0655   HCT 32.5 (L) 11/07/2023 0655   PLT 203 11/07/2023 0655   MCV 86.2 11/07/2023 0655   MCH 30.2 11/07/2023 0655   MCHC 35.1 11/07/2023 0655   RDW 13.0 11/07/2023 0655   LYMPHSABS 1.1 11/04/2023 1558   MONOABS 0.6 11/04/2023 1558   EOSABS 0.1 11/04/2023 1558   BASOSABS 0.0 11/04/2023 1558   CMP    Component Value Date/Time   NA 135 11/07/2023 0655   K 2.7 (LL) 11/07/2023 0655   CL 103 11/07/2023 0655   CO2 22 11/07/2023 0655   GLUCOSE 131 (H) 11/07/2023 0655   BUN 9 11/07/2023 0655   CREATININE 0.98 11/07/2023 0655   CALCIUM 8.8 (L)  11/07/2023 0655   PROT 7.0 11/04/2023 1558   ALBUMIN 4.1 11/04/2023 1558   AST 24 11/04/2023 1558   ALT 17 11/04/2023 1558   ALKPHOS 47 11/04/2023 1558   BILITOT 0.7 11/04/2023 1558   GFRNONAA 60 (L) 11/07/2023 0655   GFRAA >60 08/18/2019 1426   COAGS Lab Results  Component Value Date   INR 1.1 11/04/2023   INR 1.0 03/18/2023   Lipid Panel    Component Value Date/Time   CHOL 145 11/05/2023 1630   TRIG 105 11/05/2023 1630   HDL 51 11/05/2023 1630   CHOLHDL 2.8 11/05/2023 1630   VLDL 21 11/05/2023 1630   LDLCALC 73 11/05/2023 1630   HgbA1C  Lab Results  Component Value Date   HGBA1C 6.2 (H) 11/04/2023   Urinalysis    Component Value Date/Time   COLORURINE YELLOW 11/04/2023 2149   APPEARANCEUR CLEAR 11/04/2023 2149   LABSPEC 1.024 11/04/2023 2149   PHURINE 6.0 11/04/2023 2149   GLUCOSEU 50 (A) 11/04/2023 2149   HGBUR NEGATIVE 11/04/2023 2149   BILIRUBINUR NEGATIVE 11/04/2023 2149   KETONESUR NEGATIVE 11/04/2023 2149   PROTEINUR 30 (A) 11/04/2023 2149   UROBILINOGEN 0.2 04/02/2014 1222   NITRITE NEGATIVE 11/04/2023 2149   LEUKOCYTESUR NEGATIVE 11/04/2023 2149   Urine Drug Screen     Component Value Date/Time   LABOPIA NONE DETECTED 11/04/2023 2149   COCAINSCRNUR NONE DETECTED 11/04/2023 2149   LABBENZ NONE DETECTED 11/04/2023 2149   AMPHETMU NONE DETECTED 11/04/2023 2149   THCU NONE DETECTED 11/04/2023 2149   LABBARB NONE DETECTED 11/04/2023 2149    Alcohol Level    Component Value Date/Time   ETH <10 11/04/2023 1558     SIGNIFICANT DIAGNOSTIC STUDIES DG CHEST PORT 1 VIEW  Result Date: 11/06/2023 CLINICAL DATA:  Leukocytosis. EXAM: PORTABLE CHEST 1 VIEW COMPARISON:  03/19/2023 FINDINGS: The lungs are clear without focal pneumonia, edema, pneumothorax or pleural effusion. The cardiopericardial silhouette is within normal limits for size. No acute bony abnormality. Telemetry leads overlie the chest. IMPRESSION: No active disease. Electronically Signed    By: Kennith Center M.D.   On: 11/06/2023 07:47   MR BRAIN WO CONTRAST  Result Date: 11/05/2023 CLINICAL DATA:  Stroke, follow up EXAM: MRI HEAD WITHOUT CONTRAST TECHNIQUE: Multiplanar, multiecho pulse sequences of the brain and surrounding structures were obtained without intravenous contrast. COMPARISON:  Brain MR 03/18/23 FINDINGS: Brain: Punctate acute infarcts  in the posterior limb of the internal capsule on the left (series 3, image 24). No hemorrhage. No hydrocephalus. No extra-axial collection. There is background moderate to severe chronic microvascular ischemic change. Chronic microhemorrhage in the anterior right temporal and the near the right occipital lobe. Vascular: Normal flow voids. Skull and upper cervical spine: Normal marrow signal. Sinuses/Orbits: Small right mastoid effusion. No middle ear effusion. Paranasal sinuses are clear. Orbits are unremarkable Other: None. IMPRESSION: Punctate acute infarct in the posterior limb of the internal capsule on the left. No hemorrhage. These results will be called to the ordering clinician or representative by the Radiologist Assistant, and communication documented in the PACS or [employer]. Electronically Signed   By: Lorenza Cambridge M.D.   On: 11/05/2023 18:56   ECHOCARDIOGRAM COMPLETE  Result Date: 11/05/2023    ECHOCARDIOGRAM REPORT   Patient Name:   JAIDYNN GIDEON Rodriges Date of Exam: 11/05/2023 Medical Rec #:  [id number]      Height:       61.0 in Accession #:    [id number]     Weight:       127.2 lb Date of Birth:  Sep 20, 1946       BSA:          1.558 m Patient Age:    77 years       BP:           134/53 mmHg Patient Gender: F              HR:           93 bpm. Exam Location:  Inpatient Procedure: 2D Echo, Cardiac Doppler and Color Doppler Indications:    Stroke I63.9  History:        Patient has prior history of Echocardiogram examinations, most                 recent 03/19/2023. Stroke; Risk Factors:Hypertension, Former                 Smoker and  Diabetes.  Sonographer:    Dondra Prader RVT RCS Referring Phys: [id number] SRISHTI L BHAGAT  Sonographer Comments: Image acquisition challenging due to respiratory motion. IMPRESSIONS  1. Left ventricular ejection fraction, by estimation, is 60 to 65%. The left ventricle has normal function. The left ventricle has no regional wall motion abnormalities. There is mild concentric left ventricular hypertrophy. Left ventricular diastolic parameters are consistent with Grade I diastolic dysfunction (impaired relaxation).  2. Right ventricular systolic function is normal. The right ventricular size is normal. Tricuspid regurgitation signal is inadequate for assessing PA pressure.  3. The mitral valve is grossly normal. No evidence of mitral valve regurgitation.  4. The aortic valve is tricuspid. Aortic valve regurgitation is not visualized.  5. The inferior vena cava is normal in size with greater than 50% respiratory variability, suggesting right atrial pressure of 3 mmHg. Comparison(s): No significant change from prior study. FINDINGS  Left Ventricle: Left ventricular ejection fraction, by estimation, is 60 to 65%. The left ventricle has normal function. The left ventricle has no regional wall motion abnormalities. The left ventricular internal cavity size was normal in size. There is  mild concentric left ventricular hypertrophy. Left ventricular diastolic parameters are consistent with Grade I diastolic dysfunction (impaired relaxation). Right Ventricle: The right ventricular size is normal. Right ventricular systolic function is normal. Tricuspid regurgitation signal is inadequate for assessing PA pressure. Left Atrium: Left atrial size was normal in size. Right Atrium: Right atrial size  was normal in size. Pericardium: There is no evidence of pericardial effusion. Mitral Valve: The mitral valve is grossly normal. No evidence of mitral valve regurgitation. Tricuspid Valve: Tricuspid valve regurgitation is not  demonstrated. Aortic Valve: The aortic valve is tricuspid. Aortic valve regurgitation is not visualized. Aortic valve mean gradient measures 5.0 mmHg. Aortic valve peak gradient measures 9.0 mmHg. Aortic valve area, by VTI measures 1.82 cm. Pulmonic Valve: Pulmonic valve regurgitation is not visualized. Aorta: The aortic root and ascending aorta are structurally normal, with no evidence of dilitation. Venous: The inferior vena cava is normal in size with greater than 50% respiratory variability, suggesting right atrial pressure of 3 mmHg. IAS/Shunts: No atrial level shunt detected by color flow Doppler.  LEFT VENTRICLE PLAX 2D LVIDd:         4.50 cm   Diastology LVIDs:         2.30 cm   LV e' medial:    7.29 cm/s LV PW:         1.10 cm   LV E/e' medial:  7.7 LV IVS:        1.00 cm   LV e' lateral:   6.74 cm/s LVOT diam:     1.80 cm   LV E/e' lateral: 8.4 LV SV:         42 LV SV Index:   27 LVOT Area:     2.54 cm  RIGHT VENTRICLE             IVC RV S prime:     26.10 cm/s  IVC diam: 1.50 cm TAPSE (M-mode): 2.4 cm LEFT ATRIUM             Index        RIGHT ATRIUM          Index LA diam:        3.50 cm 2.25 cm/m   RA Area:     7.55 cm LA Vol (A2C):   37.7 ml 24.16 ml/m  RA Volume:   12.40 ml 7.96 ml/m LA Vol (A4C):   36.9 ml 23.68 ml/m LA Biplane Vol: 44.1 ml 28.30 ml/m  AORTIC VALVE                     PULMONIC VALVE AV Area (Vmax):    1.81 cm      PV Vmax:       1.10 m/s AV Area (Vmean):   1.63 cm      PV Peak grad:  4.8 mmHg AV Area (VTI):     1.82 cm AV Vmax:           150.00 cm/s AV Vmean:          102.250 cm/s AV VTI:            0.231 m AV Peak Grad:      9.0 mmHg AV Mean Grad:      5.0 mmHg LVOT Vmax:         106.50 cm/s LVOT Vmean:        65.400 cm/s LVOT VTI:          0.166 m LVOT/AV VTI ratio: 0.71  AORTA Ao Root diam: 2.60 cm Ao Asc diam:  2.90 cm MITRAL VALVE MV Area (PHT): 5.06 cm    SHUNTS MV Decel Time: 150 msec    Systemic VTI:  0.17 m MV E velocity: 56.30 cm/s  Systemic Diam: 1.80 cm MV A  velocity: 91.50 cm/s MV E/A  ratio:  0.62 Carolan Clines Electronically signed by Carolan Clines Signature Date/Time: 11/05/2023/11:52:29 AM    Final    Overnight EEG with video  Result Date: 11/05/2023 Charlsie Quest, MD     11/05/2023 10:34 AM Patient Name: ABEER HULTS MRN: [id number] Epilepsy Attending: Charlsie Quest Referring Physician/Provider: Gordy Councilman, MD Duration: 11/04/2023 2107 to 11/05/2023 1011 Patient history:  77 y.o. s/p TNK for clinical concern for stroke causing left-sided weakness and right gaze deviation. EEG to evaluate for seizure Level of alertness: lethargic AEDs during EEG study: None Technical aspects: This EEG study was done with scalp electrodes positioned according to the 10-20 International system of electrode placement. Electrical activity was reviewed with band pass filter of 1-70Hz , sensitivity of 7 uV/mm, display speed of 56mm/sec with a 60Hz  notched filter applied as appropriate. EEG data were recorded continuously and digitally stored.  Video monitoring was available and reviewed as appropriate. Description: At the beginning of the study, no clear posterior dominant rhythm was seen.  EEG showed continuous generalized polymorphic sharply contoured 3 to 6 Hz theta- delta slowing.  Gradually EEG appears improved and showed posterior dominant rhythm of 8 Hz activity of moderate voltage (25-35 uV) seen predominantly in posterior head regions, symmetric and reactive to eye opening and eye closing.  Sleep was characterized by vertex  sleep spindles (12 to 14 Hz), maximal frontocentral region.  EEG also showed continuous generalized 3-7hz  theta-delta slowing.  Hyperventilation and photic stimulation were not performed.   EEG was disconnected between 11/04/2023 2147 to 0209 for testing. ABNORMALITY - Continuous slow, generalized IMPRESSION: This study was initially suggestive of severe diffuse encephalopathy which improved to moderate decrease encephalopathy.  No seizures or  definite epileptiform discharges were seen throughout the recording. Priyanka Annabelle Harman   CT ANGIO HEAD NECK W WO CM (CODE STROKE)  Result Date: 11/04/2023 CLINICAL DATA:  Provided history: Neuro deficit, acute, stroke suspected. EXAM: CT ANGIOGRAPHY HEAD AND NECK WITH AND WITHOUT CONTRAST TECHNIQUE: Multidetector CT imaging of the head and neck was performed using the standard protocol during bolus administration of intravenous contrast. Multiplanar CT image reconstructions and MIPs were obtained to evaluate the vascular anatomy. Carotid stenosis measurements (when applicable) are obtained utilizing NASCET criteria, using the distal internal carotid diameter as the denominator. RADIATION DOSE REDUCTION: This exam was performed according to the departmental dose-optimization program which includes automated exposure control, adjustment of the mA and/or kV according to patient size and/or use of iterative reconstruction technique. CONTRAST:  75mL OMNIPAQUE IOHEXOL 350 MG/ML SOLN COMPARISON:  Noncontrast head CT performed earlier today 11/04/2023. FINDINGS: CTA NECK FINDINGS Aortic arch: Standard aortic branching. Atherosclerotic plaque within the visualized thoracic aorta and proximal major branch vessels of the neck. Streak/beam hardening artifact arising from a dense left-sided contrast bolus partially obscures the left subclavian artery. Within this limitation, there is no appreciable hemodynamically significant innominate or proximal subclavian artery stenosis. Right carotid system: CCA and ICA patent within the neck without stenosis. Mild atherosclerotic plaque about the carotid bifurcation. Left carotid system: CCA and ICA patent within the neck without stenosis. Mild atherosclerotic plaque about the carotid bifurcation. Vertebral arteries: Codominant and patent within the neck. Mild stenotic plaque at both vertebral artery origins. Severe narrowing of the V2 left vertebral artery at the C5-C6 level due to  degenerative osseous spurring (series 5, image 182). Skeleton: Cervical spondylosis. No acute fracture or aggressive osseous lesion. Other neck: No neck mass or cervical lymphadenopathy. Upper chest: No consolidation within the imaged  lungs. Review of the MIP images confirms the above findings CTA HEAD FINDINGS Anterior circulation: The intracranial internal carotid arteries are patent. Nonstenotic atherosclerotic plaque within the intracranial right ICA. The M1 middle cerebral arteries are patent. No M2 proximal branch occlusion or high-grade proximal stenosis. The anterior cerebral arteries are patent. Moderate narrowing of the right anterior cerebral artery proximal A2 segment. No intracranial aneurysm is identified. Posterior circulation: The intracranial vertebral arteries are patent. The basilar artery is patent. The posterior cerebral arteries are patent. Short-segment fenestration within the mid-to-distal basilar artery. The posterior cerebral arteries are patent. The posterior cerebral arteries are patent. Posterior communicating arteries are diminutive or absent, bilaterally. Venous sinuses: Within the limitations of contrast timing, no convincing thrombus. Anatomic variants: As described. Review of the MIP images confirms the above findings No emergent large vessel occlusion identified. These results were communicated to Dr. Iver Nestle at 4:45 pmon 12/5/2024by text page via the [facility] messaging system. IMPRESSION: CTA neck: 1. The common carotid and internal carotid arteries are patent within the neck without stenosis. Mild atherosclerotic plaque bilaterally, as described. 2. The vertebral arteries are patent within the neck. Non-stenotic atherosclerotic plaque at both vertebral artery origins. Severe narrowing of the V2 left vertebral artery at the C5-C6 level due to degenerative osseous spurring. CTA head: 1. No proximal intracranial large vessel occlusion identified. 2. Moderate stenosis within the right  anterior cerebral artery proximal A2 segment. 3. Short-segment fenestration within the mid-to-distal basilar artery (anatomic variant). Electronically Signed   By: Jackey Loge D.O.   On: 11/04/2023 16:46   CT HEAD CODE STROKE WO CONTRAST  Result Date: 11/04/2023 CLINICAL DATA:  Code stroke. Provided history: Neuro deficit, acute, stroke suspected. EXAM: CT HEAD WITHOUT CONTRAST TECHNIQUE: Contiguous axial images were obtained from the base of the skull through the vertex without intravenous contrast. RADIATION DOSE REDUCTION: This exam was performed according to the departmental dose-optimization program which includes automated exposure control, adjustment of the mA and/or kV according to patient size and/or use of iterative reconstruction technique. COMPARISON:  Brain MRI 03/18/2023.  Head CT 03/18/2023. FINDINGS: Brain: Generalized cerebral atrophy. Patchy and ill-defined hypoattenuation within the cerebral white matter, nonspecific but compatible with moderate-to-severe chronic small vessel ischemic disease. There is no acute intracranial hemorrhage. No demarcated cortical infarct. No extra-axial fluid collection. No evidence of an intracranial mass. No midline shift. Vascular: No hyperdense vessel.  Atherosclerotic calcifications. Skull: No calvarial fracture or aggressive osseous lesion. Sinuses/Orbits: No mass or acute finding within the imaged orbits. No significant paranasal sinus disease. ASPECTS [facility] Stroke Program Early CT Score) - Ganglionic level infarction (caudate, lentiform nuclei, internal capsule, insula, M1-M3 cortex): 7 - Supraganglionic infarction (M4-M6 cortex): 3 Total score (0-10 with 10 being normal): 10 No evidence of an acute intracranial abnormality. These results were communicated to Bhagat at 4:15 pmon 12/5/2024by text page via the [facility] messaging system. IMPRESSION: 1. No evidence of an acute intracranial abnormality. 2. Parenchymal atrophy and chronic small vessel ischemic  disease. Electronically Signed   By: Jackey Loge D.O.   On: 11/04/2023 16:17      HISTORY OF PRESENT ILLNESS JALYSIA DURDEN is a 77 y.o. woman with a past medical history significant for hypertension, hyperlipidemia, diabetes complicated by retinopathy, anxiety/depression, prolonged QT, recent admissions for hyponatremia, mild cognitive impairment on memantine   Has been reports that she has generally been somewhat fatigued today with increasing memory problems in the last few months.  However he woke her up for an appointment around noon  and she got ready on her own as is normal for her.  She was last her normal self around 1 PM.  They stopped to get food from [employer] at 1:15 PM at which point she started seeming more confused and difficult for him to understand.  He is wheelchair to get her into the doctor's appointment.  On the physician's evaluation she was noted to have a left-sided weakness which the husband had not noticed and therefore EMS was activated for code stroke   LKW: 1 PM Modified rankin score: 2-Slight disability-UNABLE to perform all activities but does not need assistance             Per neurology reports she does have some mild memory issues and has been noted to NOT be taking her medications as prescribed at times but continues to be independent with ADLs and driving without difficulty IV Thrombolysis: Yes  EVT: No, no LVO    HOSPITAL COURSE 12/5: admitted to ICU post TNK administration 12/6: one time spiking fever, put on meningitis Abx regimen 12/7: afebrile, neuro exam intact. MRI question for left PLIC punctate infarct which is not very convincing.   Stroke like symptoms s/p TNK TIA vs. Aborted stroke vs. encephalopathy, etiology unclear, needs to rule out cardioembolic source Code Stroke CT head No acute abnormality. Small vessel disease.and parenchymal atrophy  CTA head & neck No LVO, L V2 severe and L A2 moderate stenosis MRI: ? Punctate left PLIC infarct but  not convincing to me at all. moderate to severe chronic microvascular ischemic change.  2D Echo: EF 60 to 65% mild LVH, grade 1 diastolic dysfunction, no shunt recommend 30-day monitoring on discharge LDL 73 HgbA1c 6.2 UDS neg VTE prophylaxis - SCDs No antithrombotic prior to admission, now on ASA 81 and plavix 75 DAPT for 3 weeks and then ASA alone.  Therapy recommendations:  Home Health PT and OT Disposition:  home today   ? Meningitis  Confusion but no HA One time spiking fever 102.5 but then afebrile On empiric meningitis regimen with acyclovir, ampicillin, rocephin and vanco UA neg, CXR neg BCx NGTD Pt declined LP at this time Rapid neuro improvement Low suspicious for meningitis at this time.  Discussed with son who also discussed with pt husband, will hold off LP and d/c'ed meningitis Abx   Hypertension Home meds: Amlodipine 5 mg, metoprolol 25 mg, spironolactone 25 mg Add labetalol as needed Cleviprex IV off On home norvasc, metoprolol and spironolactone BP still on the high end, family would like to follow up with her PCP after discharge. Blood Pressure Goal: BP less than 180/105  Long term BP goal normotensive   Hyperlipidemia Home meds: Crestor 2.5 mg LDL 73, goal < 70 Increase crestor to 5mg  No high intensity statin due to LDL near goal Continue statin on discharge   Diabetes type II Uncontrolled Home meds: Metformin 500 mg HgbA1c 6.2, goal < 7.0 CBGs SSI Recommend close follow-up with PCP for better DM control   Other Active Problems Dementia, Namenda at home, resume Hypokalemia K 2.8->2.7->supplement with magnesium Mild hyponatremia, Na 134 Anxiety   DISCHARGE EXAM Blood pressure (!) 187/83, pulse 62, temperature 98.9 F (37.2 C), temperature source Oral, resp. rate 18, height 5\' 1"  (1.549 m), weight 57.7 kg, SpO2 98%.  General:  Alert, well-nourished, elderly Caucasian lady in no acute distress CV: Regular rate and rhythm on  monitor Respiratory:  Regular, unlabored respirations on room air GI: Abdomen soft and non-tender   NEURO:  Mental  Status: awake alert, oriented x 3.  Speech/Language: no aphasia, or dysarthria, intact language   Cranial Nerves:  II: PERRL. Visual field full. III, IV, VI: EOMI. Eyelids elevate symmetrically.  V: Sensation is intact to light touch and symmetrical to face.  VII: Face is symmetrical resting and smiling VIII: hearing intact to voice. IX, X: Palate elevates symmetrically. Phonation is normal.  ZO:[id number] shrug is decreased on left  XII: tongue is midline without fasciculations. Motor: 5/5 strength to all muscle groups tested.  Tone: is normal and bulk is normal Sensation- symmetrical, no sensory neglect Coordination: FTN intact bilaterally, HKS: no ataxia in BLE.No drift.  Gait- deferred  Discharge Diet       Diet   Diet Heart Room service appropriate? Yes; Fluid consistency: Thin   liquids  DISCHARGE PLAN Disposition:  home with husband DAPT  for secondary stroke prevention for 3 weeks then ASA alone. Ongoing stroke risk factor control by Primary Care Physician at time of discharge Follow-up PCP Renford Dills, MD in 2 days. Follow-up in [facility] Ms. Gwynneth Munson on 01/12/24 as scheduled   40 minutes were spent preparing discharge.  Marvel Plan, MD PhD Stroke Neurology 11/07/2023 2:12 PM

## 2023-11-07 NOTE — Discharge Instructions (Signed)
You were admitted for symptoms concerning for stroke. You received blood buster medications for that and your symptoms have resolved. There was some concerns of your symptoms resembling brain dysfunction or infection, but seems less likely at this time. After discharge, you need to take blood thinners including ASA and plavix for 3 weeks and then ASA alone. We also increased your crestor to 5mg  once a day. Your potassium was low and we will give you supplement and please follow up with your PCP to get it checked ASAP after discharge. Continue other home medications. Check BP at home and call your PCP as needed. If you have again stroke like symptoms or fever, headache, confusion or seizure, please call 911 immediately. You will follow up with your neurology provider on 01/12/24 as scheduled.

## 2023-11-07 NOTE — Progress Notes (Signed)
Discharge education/instructions provided to patient and patient's family members.    VSS. RA. Respirations are even and unlabored. NAD. PIV removed, tip intact.   AVS and all personal belongings sent with patient. Patient transported home by family members via private vehicle. Rolling walker arrived and sent with patient.

## 2023-11-07 NOTE — TOC Transition Note (Signed)
Transition of Care [facility]) - CM/SW Discharge Note   Patient Details  Name: Cindy Rhodes MRN: [id number] Date of Birth: Jul 18, 1946  [facility] CM/SW Contact:  Lawerance Sabal, RN Phone Number: 11/07/2023, 1:58 PM   Clinical Narrative:     Patient to return to [facility] w PT OT and RW     Spoke w spouse, confirms they are in ILF. He would like the in house provider, [facility], for PT OT services. Order placed and faxed to [facility] at [phone]. Spouse would like RW for patient, this has been ordered through [facility] who will deliver to room before DC           Patient Goals and CMS Choice      Discharge Placement                         Discharge Plan and Services Additional resources added to the After Visit Summary for                                       Social Determinants of Health (SDOH) Interventions SDOH Screenings   Food Insecurity: No Food Insecurity (11/06/2023)  Housing: Low Risk  (11/06/2023)  Transportation Needs: No Transportation Needs (11/06/2023)  Utilities: Not At Risk (11/06/2023)  Tobacco Use: Medium Risk (11/04/2023)     Readmission Risk Interventions     No data to display

## 2023-11-08 ENCOUNTER — Encounter: Payer: Self-pay | Admitting: Physician Assistant

## 2023-11-08 DIAGNOSIS — Z9181 History of falling: Secondary | ICD-10-CM | POA: Diagnosis not present

## 2023-11-08 DIAGNOSIS — R2681 Unsteadiness on feet: Secondary | ICD-10-CM | POA: Diagnosis not present

## 2023-11-08 DIAGNOSIS — M6281 Muscle weakness (generalized): Secondary | ICD-10-CM | POA: Diagnosis not present

## 2023-11-08 DIAGNOSIS — M545 Low back pain, unspecified: Secondary | ICD-10-CM | POA: Diagnosis not present

## 2023-11-08 DIAGNOSIS — G459 Transient cerebral ischemic attack, unspecified: Secondary | ICD-10-CM | POA: Diagnosis not present

## 2023-11-09 ENCOUNTER — Encounter: Payer: Self-pay | Admitting: Physician Assistant

## 2023-11-09 DIAGNOSIS — G459 Transient cerebral ischemic attack, unspecified: Secondary | ICD-10-CM | POA: Diagnosis not present

## 2023-11-09 DIAGNOSIS — R2681 Unsteadiness on feet: Secondary | ICD-10-CM | POA: Diagnosis not present

## 2023-11-09 DIAGNOSIS — Z9181 History of falling: Secondary | ICD-10-CM | POA: Diagnosis not present

## 2023-11-09 DIAGNOSIS — I10 Essential (primary) hypertension: Secondary | ICD-10-CM | POA: Diagnosis not present

## 2023-11-09 DIAGNOSIS — M6281 Muscle weakness (generalized): Secondary | ICD-10-CM | POA: Diagnosis not present

## 2023-11-09 DIAGNOSIS — N1831 Chronic kidney disease, stage 3a: Secondary | ICD-10-CM | POA: Diagnosis not present

## 2023-11-09 DIAGNOSIS — R2990 Unspecified symptoms and signs involving the nervous system: Secondary | ICD-10-CM | POA: Diagnosis not present

## 2023-11-09 DIAGNOSIS — E876 Hypokalemia: Secondary | ICD-10-CM | POA: Diagnosis not present

## 2023-11-09 DIAGNOSIS — E1122 Type 2 diabetes mellitus with diabetic chronic kidney disease: Secondary | ICD-10-CM | POA: Diagnosis not present

## 2023-11-09 DIAGNOSIS — M545 Low back pain, unspecified: Secondary | ICD-10-CM | POA: Diagnosis not present

## 2023-11-11 DIAGNOSIS — Z9181 History of falling: Secondary | ICD-10-CM | POA: Diagnosis not present

## 2023-11-11 DIAGNOSIS — M545 Low back pain, unspecified: Secondary | ICD-10-CM | POA: Diagnosis not present

## 2023-11-11 DIAGNOSIS — R2681 Unsteadiness on feet: Secondary | ICD-10-CM | POA: Diagnosis not present

## 2023-11-11 DIAGNOSIS — M6281 Muscle weakness (generalized): Secondary | ICD-10-CM | POA: Diagnosis not present

## 2023-11-11 DIAGNOSIS — G459 Transient cerebral ischemic attack, unspecified: Secondary | ICD-10-CM | POA: Diagnosis not present

## 2023-11-11 LAB — CULTURE, BLOOD (ROUTINE X 2)
Culture: NO GROWTH
Culture: NO GROWTH

## 2023-11-12 DIAGNOSIS — R279 Unspecified lack of coordination: Secondary | ICD-10-CM | POA: Diagnosis not present

## 2023-11-12 DIAGNOSIS — Z9181 History of falling: Secondary | ICD-10-CM | POA: Diagnosis not present

## 2023-11-12 DIAGNOSIS — M6281 Muscle weakness (generalized): Secondary | ICD-10-CM | POA: Diagnosis not present

## 2023-11-12 DIAGNOSIS — I6782 Cerebral ischemia: Secondary | ICD-10-CM | POA: Diagnosis not present

## 2023-11-15 DIAGNOSIS — M545 Low back pain, unspecified: Secondary | ICD-10-CM | POA: Diagnosis not present

## 2023-11-15 DIAGNOSIS — R2681 Unsteadiness on feet: Secondary | ICD-10-CM | POA: Diagnosis not present

## 2023-11-15 DIAGNOSIS — Z9181 History of falling: Secondary | ICD-10-CM | POA: Diagnosis not present

## 2023-11-15 DIAGNOSIS — I6782 Cerebral ischemia: Secondary | ICD-10-CM | POA: Diagnosis not present

## 2023-11-15 DIAGNOSIS — M6281 Muscle weakness (generalized): Secondary | ICD-10-CM | POA: Diagnosis not present

## 2023-11-15 DIAGNOSIS — R279 Unspecified lack of coordination: Secondary | ICD-10-CM | POA: Diagnosis not present

## 2023-11-15 DIAGNOSIS — G459 Transient cerebral ischemic attack, unspecified: Secondary | ICD-10-CM | POA: Diagnosis not present

## 2023-11-16 DIAGNOSIS — M545 Low back pain, unspecified: Secondary | ICD-10-CM | POA: Diagnosis not present

## 2023-11-16 DIAGNOSIS — M6281 Muscle weakness (generalized): Secondary | ICD-10-CM | POA: Diagnosis not present

## 2023-11-16 DIAGNOSIS — Z9181 History of falling: Secondary | ICD-10-CM | POA: Diagnosis not present

## 2023-11-16 DIAGNOSIS — R2681 Unsteadiness on feet: Secondary | ICD-10-CM | POA: Diagnosis not present

## 2023-11-16 DIAGNOSIS — G459 Transient cerebral ischemic attack, unspecified: Secondary | ICD-10-CM | POA: Diagnosis not present

## 2023-11-17 DIAGNOSIS — M6281 Muscle weakness (generalized): Secondary | ICD-10-CM | POA: Diagnosis not present

## 2023-11-17 DIAGNOSIS — M545 Low back pain, unspecified: Secondary | ICD-10-CM | POA: Diagnosis not present

## 2023-11-17 DIAGNOSIS — Z9181 History of falling: Secondary | ICD-10-CM | POA: Diagnosis not present

## 2023-11-17 DIAGNOSIS — R2681 Unsteadiness on feet: Secondary | ICD-10-CM | POA: Diagnosis not present

## 2023-11-17 DIAGNOSIS — G459 Transient cerebral ischemic attack, unspecified: Secondary | ICD-10-CM | POA: Diagnosis not present

## 2023-11-18 DIAGNOSIS — R279 Unspecified lack of coordination: Secondary | ICD-10-CM | POA: Diagnosis not present

## 2023-11-18 DIAGNOSIS — Z9181 History of falling: Secondary | ICD-10-CM | POA: Diagnosis not present

## 2023-11-18 DIAGNOSIS — I6782 Cerebral ischemia: Secondary | ICD-10-CM | POA: Diagnosis not present

## 2023-11-18 DIAGNOSIS — M6281 Muscle weakness (generalized): Secondary | ICD-10-CM | POA: Diagnosis not present

## 2023-11-22 DIAGNOSIS — Z9181 History of falling: Secondary | ICD-10-CM | POA: Diagnosis not present

## 2023-11-22 DIAGNOSIS — R2681 Unsteadiness on feet: Secondary | ICD-10-CM | POA: Diagnosis not present

## 2023-11-22 DIAGNOSIS — G459 Transient cerebral ischemic attack, unspecified: Secondary | ICD-10-CM | POA: Diagnosis not present

## 2023-11-22 DIAGNOSIS — M545 Low back pain, unspecified: Secondary | ICD-10-CM | POA: Diagnosis not present

## 2023-11-22 DIAGNOSIS — M6281 Muscle weakness (generalized): Secondary | ICD-10-CM | POA: Diagnosis not present

## 2023-11-25 DIAGNOSIS — R279 Unspecified lack of coordination: Secondary | ICD-10-CM | POA: Diagnosis not present

## 2023-11-25 DIAGNOSIS — I6782 Cerebral ischemia: Secondary | ICD-10-CM | POA: Diagnosis not present

## 2023-11-25 DIAGNOSIS — M6281 Muscle weakness (generalized): Secondary | ICD-10-CM | POA: Diagnosis not present

## 2023-11-25 DIAGNOSIS — Z9181 History of falling: Secondary | ICD-10-CM | POA: Diagnosis not present

## 2023-12-08 ENCOUNTER — Ambulatory Visit: Payer: PPO | Admitting: Physician Assistant

## 2023-12-22 ENCOUNTER — Other Ambulatory Visit: Payer: Self-pay | Admitting: Physician Assistant

## 2024-01-03 DIAGNOSIS — M6281 Muscle weakness (generalized): Secondary | ICD-10-CM | POA: Diagnosis not present

## 2024-01-03 DIAGNOSIS — M545 Low back pain, unspecified: Secondary | ICD-10-CM | POA: Diagnosis not present

## 2024-01-03 DIAGNOSIS — R2681 Unsteadiness on feet: Secondary | ICD-10-CM | POA: Diagnosis not present

## 2024-01-03 DIAGNOSIS — Z9181 History of falling: Secondary | ICD-10-CM | POA: Diagnosis not present

## 2024-01-03 DIAGNOSIS — G459 Transient cerebral ischemic attack, unspecified: Secondary | ICD-10-CM | POA: Diagnosis not present

## 2024-01-05 DIAGNOSIS — I6782 Cerebral ischemia: Secondary | ICD-10-CM | POA: Diagnosis not present

## 2024-01-05 DIAGNOSIS — Z9181 History of falling: Secondary | ICD-10-CM | POA: Diagnosis not present

## 2024-01-05 DIAGNOSIS — R279 Unspecified lack of coordination: Secondary | ICD-10-CM | POA: Diagnosis not present

## 2024-01-05 DIAGNOSIS — M6281 Muscle weakness (generalized): Secondary | ICD-10-CM | POA: Diagnosis not present

## 2024-01-10 DIAGNOSIS — Z9181 History of falling: Secondary | ICD-10-CM | POA: Diagnosis not present

## 2024-01-10 DIAGNOSIS — R2681 Unsteadiness on feet: Secondary | ICD-10-CM | POA: Diagnosis not present

## 2024-01-10 DIAGNOSIS — M545 Low back pain, unspecified: Secondary | ICD-10-CM | POA: Diagnosis not present

## 2024-01-10 DIAGNOSIS — M6281 Muscle weakness (generalized): Secondary | ICD-10-CM | POA: Diagnosis not present

## 2024-01-10 DIAGNOSIS — G459 Transient cerebral ischemic attack, unspecified: Secondary | ICD-10-CM | POA: Diagnosis not present

## 2024-01-11 ENCOUNTER — Ambulatory Visit: Payer: PPO | Admitting: Physician Assistant

## 2024-01-11 ENCOUNTER — Ambulatory Visit: Payer: PPO | Admitting: Podiatry

## 2024-01-11 ENCOUNTER — Encounter: Payer: Self-pay | Admitting: Physician Assistant

## 2024-01-11 VITALS — BP 173/77 | Resp 20 | Ht 61.0 in | Wt 124.0 lb

## 2024-01-11 DIAGNOSIS — G3184 Mild cognitive impairment, so stated: Secondary | ICD-10-CM | POA: Diagnosis not present

## 2024-01-11 DIAGNOSIS — R413 Other amnesia: Secondary | ICD-10-CM

## 2024-01-11 DIAGNOSIS — G9341 Metabolic encephalopathy: Secondary | ICD-10-CM

## 2024-01-11 DIAGNOSIS — I679 Cerebrovascular disease, unspecified: Secondary | ICD-10-CM

## 2024-01-11 MED ORDER — MEMANTINE HCL 10 MG PO TABS: ORAL_TABLET | ORAL | 11 refills | Status: AC

## 2024-01-11 NOTE — Patient Instructions (Signed)
 It was a pleasure to see you today at our office.   Recommendations:  Neurocognitive evaluation at our office 10-16 month  Increase the memantine to 10 mg as directed  Follow up in 6  months Recommend good control of cardiovascular risk factors.       For psychiatric meds, mood meds: Please have your primary care physician manage these medications.  If you have any severe symptoms of a stroke, or other severe issues such as confusion,severe chills or fever, etc call 911 or go to the ER as you may need to be evaluated further    For assessment of decision of mental capacity and competency:  Call Dr. Erick Blinks, geriatric psychiatrist at [phone]  Counseling regarding caregiver distress, including caregiver depression, anxiety and issues regarding community resources, adult day care programs, adult living facilities, or memory care questions:  please contact your  Primary Doctor's Social Worker   Whom to call: Memory  decline, memory medications: Call our office [phone]    [email or web address]   RECOMMENDATIONS FOR ALL PATIENTS WITH MEMORY PROBLEMS: 1. Continue to exercise (Recommend 30 minutes of walking everyday, or 3 hours every week) 2. Increase social interactions - continue going to [place] and enjoy social gatherings with friends and family 3. Eat healthy, avoid fried foods and eat more fruits and vegetables 4. Maintain adequate blood pressure, blood sugar, and blood cholesterol level. Reducing the risk of stroke and cardiovascular disease also helps promoting better memory. 5. Avoid stressful situations. Live a simple life and avoid aggravations. Organize your time and prepare for the next day in anticipation. 6. Sleep well, avoid any interruptions of sleep and avoid any distractions in the bedroom that may interfere with adequate sleep quality 7. Avoid sugar, avoid sweets as there is a strong link between  excessive sugar intake, diabetes, and cognitive impairment We discussed the Mediterranean diet, which has been shown to help patients reduce the risk of progressive memory disorders and reduces cardiovascular risk. This includes eating fish, eat fruits and green leafy vegetables, nuts like almonds and hazelnuts, walnuts, and also use olive oil. Avoid fast foods and fried foods as much as possible. Avoid sweets and sugar as sugar use has been linked to worsening of memory function.  There is always a concern of gradual progression of memory problems. If this is the case, then we may need to adjust level of care according to patient needs. Support, both to the patient and caregiver, should then be put into place.      You have been referred for a neuropsychological evaluation (i.e., evaluation of memory and thinking abilities). Please bring someone with you to this appointment if possible, as it is helpful for the doctor to hear from both you and another adult who knows you well. Please bring eyeglasses and hearing aids if you wear them.    The evaluation will take approximately 3 hours and has two parts:   The first part is a clinical interview with the neuropsychologist (Dr. Milbert Coulter or Dr. Roseanne Reno). During the interview, the neuropsychologist will speak with you and the individual you brought to the appointment.    The second part of the evaluation is testing with the doctor's technician Annabelle Harman or Selena Batten). During the testing, the technician will ask you to remember different types of material, solve problems, and answer some questionnaires. Your family member will not be present for this portion of the evaluation.   Please note: We must reserve several hours of the neuropsychologist's time and  the psychometrician's time for your evaluation appointment. As such, there is a No-Show fee of $100. If you are unable to attend any of your appointments, please contact our office as soon as possible to reschedule.       DRIVING: Regarding driving, in patients with progressive memory problems, driving will be impaired. We advise to have someone else do the driving if trouble finding directions or if minor accidents are reported. Independent driving assessment is available to determine safety of driving.   If you are interested in the driving assessment, you can contact the following:  [employer] in [place] [phone]  [facility] [phone]  [facility] [phone]  [facility] [phone] or [phone]   FALL PRECAUTIONS: Be cautious when walking. Scan the area for obstacles that may increase the risk of trips and falls. When getting up in the mornings, sit up at the edge of the bed for a few minutes before getting out of bed. Consider elevating the bed at the head end to avoid drop of blood pressure when getting up. Walk always in a well-lit room (use night lights in the walls). Avoid area rugs or power cords from appliances in the middle of the walkways. Use a walker or a cane if necessary and consider physical therapy for balance exercise. Get your eyesight checked regularly.  FINANCIAL OVERSIGHT: Supervision, especially oversight when making financial decisions or transactions is also recommended.  HOME SAFETY: Consider the safety of the kitchen when operating appliances like stoves, microwave oven, and blender. Consider having supervision and share cooking responsibilities until no longer able to participate in those. Accidents with firearms and other hazards in the house should be identified and addressed as well.   ABILITY TO BE LEFT ALONE: If patient is unable to contact 911 operator, consider using LifeLine, or when the need is there, arrange for someone to stay with patients. Smoking is a fire hazard, consider supervision or cessation. Risk of wandering should be assessed by caregiver and if detected at any point, supervision and  safe proof recommendations should be instituted.  MEDICATION SUPERVISION: Inability to self-administer medication needs to be constantly addressed. Implement a mechanism to ensure safe administration of the medications.      Mediterranean Diet A Mediterranean diet refers to food and lifestyle choices that are based on the traditions of countries located on the [employer] [place]. This way of eating has been shown to help prevent certain conditions and improve outcomes for people who have chronic diseases, like kidney disease and heart disease. What are tips for following this plan? Lifestyle  Cook and eat meals together with your family, when possible. Drink enough fluid to keep your urine clear or pale yellow. Be physically active every day. This includes: Aerobic exercise like running or swimming. Leisure activities like gardening, walking, or housework. Get 7-8 hours of sleep each night. If recommended by your health care provider, drink red wine in moderation. This means 1 glass a day for nonpregnant women and 2 glasses a day for men. A glass of wine equals 5 oz (150 mL). Reading food labels  Check the serving size of packaged foods. For foods such as rice and pasta, the serving size refers to the amount of cooked product, not dry. Check the total fat in packaged foods. Avoid foods that have saturated fat or trans fats. Check the ingredients list for added sugars, such as corn syrup. Shopping  At the grocery store, buy most of your food from the  areas near the walls of the store. This includes: Fresh fruits and vegetables (produce). Grains, beans, nuts, and seeds. Some of these may be available in unpackaged forms or large amounts (in bulk). Fresh seafood. Poultry and eggs. Low-fat dairy products. Buy whole ingredients instead of prepackaged foods. Buy fresh fruits and vegetables in-season from local farmers markets. Buy frozen fruits and vegetables in resealable bags. If you do  not have access to quality fresh seafood, buy precooked frozen shrimp or canned fish, such as tuna, salmon, or sardines. Buy small amounts of raw or cooked vegetables, salads, or olives from the deli or salad bar at your store. Stock your pantry so you always have certain foods on hand, such as olive oil, canned tuna, canned tomatoes, rice, pasta, and beans. Cooking  Cook foods with extra-virgin olive oil instead of using butter or other vegetable oils. Have meat as a side dish, and have vegetables or grains as your main dish. This means having meat in small portions or adding small amounts of meat to foods like pasta or stew. Use beans or vegetables instead of meat in common dishes like chili or lasagna. Experiment with different cooking methods. Try roasting or broiling vegetables instead of steaming or sauteing them. Add frozen vegetables to soups, stews, pasta, or rice. Add nuts or seeds for added healthy fat at each meal. You can add these to yogurt, salads, or vegetable dishes. Marinate fish or vegetables using olive oil, lemon juice, garlic, and fresh herbs. Meal planning  Plan to eat 1 vegetarian meal one day each week. Try to work up to 2 vegetarian meals, if possible. Eat seafood 2 or more times a week. Have healthy snacks readily available, such as: Vegetable sticks with hummus. Greek yogurt. Fruit and nut trail mix. Eat balanced meals throughout the week. This includes: Fruit: 2-3 servings a day Vegetables: 4-5 servings a day Low-fat dairy: 2 servings a day Fish, poultry, or lean meat: 1 serving a day Beans and legumes: 2 or more servings a week Nuts and seeds: 1-2 servings a day Whole grains: 6-8 servings a day Extra-virgin olive oil: 3-4 servings a day Limit red meat and sweets to only a few servings a month What are my food choices? Mediterranean diet Recommended Grains: Whole-grain pasta. Brown rice. Bulgar wheat. Polenta. Couscous. Whole-wheat bread. Orpah Cobb. Vegetables: Artichokes. Beets. Broccoli. Cabbage. Carrots. Eggplant. Green beans. Chard. Kale. Spinach. Onions. Leeks. Peas. Squash. Tomatoes. Peppers. Radishes. Fruits: Apples. Apricots. Avocado. Berries. Bananas. Cherries. Dates. Figs. Grapes. Lemons. Melon. Oranges. Peaches. Plums. Pomegranate. Meats and other protein foods: Beans. Almonds. Sunflower seeds. Pine nuts. Peanuts. Cod. Salmon. Scallops. Shrimp. Tuna. Tilapia. Clams. Oysters. Eggs. Dairy: Low-fat milk. Cheese. Greek yogurt. Beverages: Water. Red wine. Herbal tea. Fats and oils: Extra virgin olive oil. Avocado oil. Grape seed oil. Sweets and desserts: [place] yogurt with honey. Baked apples. Poached pears. Trail mix. Seasoning and other foods: Basil. Cilantro. Coriander. Cumin. Mint. Parsley. Sage. Rosemary. Tarragon. Garlic. Oregano. Thyme. Pepper. Balsalmic vinegar. Tahini. Hummus. Tomato sauce. Olives. Mushrooms. Limit these Grains: Prepackaged pasta or rice dishes. Prepackaged cereal with added sugar. Vegetables: Deep fried potatoes (french fries). Fruits: Fruit canned in syrup. Meats and other protein foods: Beef. Pork. Lamb. Poultry with skin. Hot dogs. Tomasa Blase. Dairy: Ice cream. Sour cream. Whole milk. Beverages: Juice. Sugar-sweetened soft drinks. Beer. Liquor and spirits. Fats and oils: Butter. [place] oil. Vegetable oil. Beef fat (tallow). Lard. Sweets and desserts: Cookies. Cakes. Pies. Candy. Seasoning and other foods: Mayonnaise. Premade sauces and  marinades. The items listed may not be a complete list. Talk with your dietitian about what dietary choices are right for you. Summary The Mediterranean diet includes both food and lifestyle choices. Eat a variety of fresh fruits and vegetables, beans, nuts, seeds, and whole grains. Limit the amount of red meat and sweets that you eat. Talk with your health care provider about whether it is safe for you to drink red wine in moderation. This means 1 glass a day for  nonpregnant women and 2 glasses a day for men. A glass of wine equals 5 oz (150 mL). This information is not intended to replace advice given to you by your health care provider. Make sure you discuss any questions you have with your health care provider. Document Released: 07/09/2016 Document Revised: 08/11/2016 Document Reviewed: 07/09/2016 Elsevier Interactive Patient Education  2017 [employer].

## 2024-01-11 NOTE — Progress Notes (Signed)
 Assessment/Plan:     Stroke Like Symptoms TIA vs. Aborted Stroke vs. Encephalopathy   Continue baby ASA alone Recommend follow up with Cards, patient has a history of QT prolongation Recommend good control of cardiovascular risk factors, secondary stroke prevention.. Patient informed of elevated BP today       Mild cognitive impairment, likely of vascular etiology and recurrent metabolic encephalopathies   Cindy Rhodes is a very pleasant 78 y.o. RH female with a history of prolonged QT interval, hypertension, hyperlipidemia, DM2 with retinopathy, anxiety, depression, recent hospitalization in April 2024 for hyponatremia due to excessive water intake, and a diagnosis of mild cognitive impairment likely due to vascular disease, recurrent metabolic encephalopathies  presenting today in follow-up for evaluation of memory loss. Patient is on memantine 5 mg bid.  Cognitive decline is noted, discussed increasing memantine to 10 mg twice daily, husband agrees. Mood is good.     Follow up in 6 months. Repeat neuropsych evaluation in 10 to 12 months for clarity of the diagnosis and disease trajectory Continue memantine, increase to 10 mg  as directed, (history of QT prolongation).  Side effects discussed. wWould like an evaluation from Cards in view of this abnormality, as Aricept and Rivastigmine, if needed may worsen the Qti,  bradycardia.  Recommend checking hearing for better comprehension Recommend good control of cardiovascular risk factors Continue to control mood as per PCP        Subjective:   This patient is accompanied in the office by her husband who supplements the history. Previous records as well as any outside records available were reviewed prior to todays visit.   Patient was last seen on 07/09/23.     Any changes in memory since last visit? "Not worse", but husband disagrees". Patient has some difficulty remembering recent conversations and names of people . LTM is  good repeats oneself?  Endorsed by her husband  Disoriented when walking into a room?  Patient denies    Misplacing objects?  Patient denies   Wandering behavior?   denies   Any personality changes since last visit? Denies. [name]   Any worsening depression?: denies.    Hallucinations or paranoia?  Denies.   Seizures?   Denies.    Any sleep changes? Sleeps well. Reports vivid dreams, REM behavior or sleepwalking   Sleep apnea?   Denies.   Any hygiene concerns?   Denies   Independent of bathing and dressing?  Endorsed  Does the patient needs help with medications? Patient is in charge   Who is in charge of the finances?  Husband is in charge     Any changes in appetite?  Lost some weight during the hospitalization.    Patient have trouble swallowing?  denies   Does the patient cook?  No  Any headaches?    Denies.   Vision changes? Denies. Chronic pain?  Denies.    Ambulates with difficulty?Uses a cane for stability. No major issues. Does PT.  Recent falls or head injuries?    Denies.       Stroke like symptoms/Hospitalization  : She presented to the ED on 11/04/2023 with increasing fatigue and confusion and slurred speech before stopping for lunch at [facility].  She also was noted to have left-sided weakness, EMS was activated for code stroke.  Neurology evaluated her, with a CT of the head without acute intracranial abnormalities, remarkable for small vessel disease and parenchymal atrophy.  CT angio of the head and neck was without LVO,  showing LV to severe mL a to moderate stenosis, admitted to the ICU post TNK administration.  MRI of the brain showed a questionable left PCA L ICA punctate infarct although neuro wrote "not convincing to me at all ".  Moderate to severe chronic microvascular ischemic changes were seen.  2D echo showed normal EF 60 to 65%, mild LVH, grade 1 diastolic dysfunction, no shunt.  Neuro recommended aspirin and Plavix 75 mg DAPT for 3 weeks and then aspirin alone along  with secondary stroke prevention with Crestor and antihypertensives.  A1c was 6.2, of note, she was noted to have hypokalemia and mild hyponatremia which may have contributed to some of the encephalopathic symptoms. Her status was complicated with spiking fever up to 102.5, and was placed on meningitis regimen including acyclovir, ampicillin, Rocephin and vancomycin.  Blood cultures were negative.  UA and chest x-ray were negative as well.  She declined LP, thus this was held off.  Status did improve during hospitalization .  She was discharged to home on 11/07/2023.        Patient  never had a similar episode.Denies any history of TIA. Denies vertigo dizziness, dysarthria or dysphagia, seizures. Denies any chest        pain, or shortness of breath. Denies any recurrence of fever or chills, or night sweats. No tobacco. No new meds or hormonal supplements.         Denies any recent long distance trips or recent surgeries. No sick contacts. No new stressors present in personal life.  Patient is compliant with         her medications.   Any tremors?  denies   Any anosmia?    Denies.   Any incontinence of urine?  Denies.   Any bowel dysfunction?  Denies.      Patient lives with husband at [facility]      Does the patient drive? Not often. Husband does most of the riving.    Past Medical History:  Diagnosis Date   Acute lower UTI 08/12/2017   Acute metabolic encephalopathy 03/18/2023   Atrophy of vagina 05/14/2021   Cerebrovascular disease    moderate to severe per MRI   DDD (degenerative disc disease), cervical 10/13/2019   Decreased estrogen level 05/14/2021   Diabetic renal disease 05/14/2021   Essential hypertension 08/12/2017   Generalized anxiety disorder 05/14/2021   Hypertensive crisis 03/18/2023   Hypokalemia 08/12/2017   Hypomagnesemia 03/18/2023   Hyponatremia 08/12/2017   Interstitial cystitis 05/14/2021   Leukocytosis 05/14/2021   Major depressive disorder in remission  05/14/2021   Migraine without aura, not refractory 05/14/2021   Mild cognitive impairment of uncertain or unknown etiology 05/27/2023   Neck pain 08/28/2019   Osteopenia 05/14/2021   Pain in right hand 05/08/2019   Pincer nail deformity 01/24/2020   Prolonged QT interval 03/18/2023   Pure hypercholesterolemia 05/14/2021   Spinal stenosis in cervical region 10/13/2019   Subacute vulvitis 05/14/2021   Trigger finger of right hand 05/08/2019   Type 2 diabetes mellitus with hyperglycemia 08/12/2017   Urge incontinence of urine 05/14/2021     Past Surgical History:  Procedure Laterality Date   ABDOMINAL HYSTERECTOMY     HAND SURGERY Left      PREVIOUS MEDICATIONS:   CURRENT MEDICATIONS:  Outpatient Encounter Medications as of 01/11/2024  Medication Sig   amLODipine (NORVASC) 5 MG tablet Take 1 tablet (5 mg total) by mouth daily.   Ascorbic Acid (VITAMIN C) 500 MG CAPS Take 500  mg by mouth daily.   aspirin EC 81 MG tablet Take 1 tablet (81 mg total) by mouth daily. Swallow whole.   Calcium Carbonate-Vitamin D 600-400 MG-UNIT tablet Take 1 tablet by mouth daily.   clopidogrel (PLAVIX) 75 MG tablet Take 1 tablet (75 mg total) by mouth daily.   Coenzyme Q10 (CO Q 10 PO) Take 1 capsule by mouth daily.   cyanocobalamin 1000 MCG tablet Take 1 tablet (1,000 mcg total) by mouth daily.   famotidine (PEPCID) 20 MG tablet Take 20 mg by mouth daily.   glucosamine-chondroitin 500-400 MG tablet Take 3 tablets by mouth daily.    metFORMIN (GLUCOPHAGE-XR) 500 MG 24 hr tablet Take 1,000 mg by mouth 2 (two) times daily.   metoprolol tartrate (LOPRESSOR) 25 MG tablet Take 25 mg by mouth 2 (two) times daily.    Misc Natural Products (TART CHERRY ADVANCED) CAPS Take 1 capsule by mouth daily.   Multiple Vitamin (MULTIVITAMIN WITH MINERALS) TABS tablet Take 1 tablet by mouth daily.   oxybutynin (DITROPAN-XL) 10 MG 24 hr tablet Take 10 mg by mouth daily.    rosuvastatin (CRESTOR) 5 MG tablet Take 1 tablet  (5 mg total) by mouth at bedtime.   sertraline (ZOLOFT) 50 MG tablet Take 50 mg by mouth daily.   spironolactone (ALDACTONE) 25 MG tablet Take 0.5 tablets (12.5 mg total) by mouth daily.   telmisartan (MICARDIS) 80 MG tablet Take 1 tablet (80 mg total) by mouth daily.   [DISCONTINUED] memantine (NAMENDA) 5 MG tablet TAKE 1 TABLET BY MOUTH EVERY NIGHT FOR 2 WEEKS, THEN INCREASE TO TAKE 1 TABLET BY MOUTH TWO TIMES A DAY   memantine (NAMENDA) 10 MG tablet TAKE 1 TABLET BY MOUTH EVERY NIGHT FOR 2 WEEKS, THEN INCREASE TO TAKE 1 TABLET BY MOUTH TWO TIMES A DAY   potassium chloride SA (KLOR-CON M) 20 MEQ tablet Take 2 tablets (40 mEq total) by mouth 2 (two) times daily for 2 days.   [DISCONTINUED] telmisartan-hydrochlorothiazide (MICARDIS HCT) 80-25 MG tablet Take 1 tablet by mouth daily.    No facility-administered encounter medications on file as of 01/11/2024.     Objective:     PHYSICAL EXAMINATION:    VITALS:   Vitals:   01/11/24 1513 01/11/24 1617  BP: (!) 169/70 (!) 173/77  Resp: 20   Weight: 124 lb (56.2 kg)   Height: 5\' 1"  (1.549 m)     GEN:  The patient appears stated age and is in NAD. HEENT:  Normocephalic, atraumatic.   Neurological examination:  General: NAD, well-groomed, appears stated age. Orientation: The patient is alert. Oriented to person, place and date Cranial nerves: There is good facial symmetry.The speech is fluent and clear. No aphasia or dysarthria. Fund of knowledge is appropriate. Recent memory impaired and remote memory is normal.  Attention and concentration are normal.  Able to name objects and repeat phrases.  Hearing is slightly decreased  to conversational tone  Sensation: Sensation is intact to light touch throughout Motor: Strength is at least antigravity x4. DTR's 2/4 in UE/LE      04/22/2023    2:00 PM  Montreal Cognitive Assessment   Visuospatial/ Executive (0/5) 3  Naming (0/3) 3  Attention: Read list of digits (0/2) 0  Attention: Read  list of letters (0/1) 1  Attention: Serial 7 subtraction starting at 100 (0/3) 3  Language: Repeat phrase (0/2) 2  Language : Fluency (0/1) 0  Abstraction (0/2) 2  Delayed Recall (0/5) 1  Orientation (0/6) 6  Total 21  Adjusted Score (based on education) 21        No data to display         Lab Results  Component Value Date   HGBA1C 6.2 (H) 11/04/2023   HGBA1C 5.8 (H) 03/19/2023   HGBA1C 7.7 (H) 08/13/2017    Labs 10/2023 B12 210,TSH 1.74  EEG with video 11/05/2023, overnight with suggestive of severe diffuse encephalopathy improving to moderate decrease encephalopathy without seizures or other epileptiform discharges  MRI of the brain without contrast 11/04/2023, personally reviewed remarkable for "punctate acute infarct in posterior limb of the internal capsule on the left without hemorrhage ".  Of note, neurology disagreed with that finding (series 3 image 24).  Chronic microhemorrhage in anterior right temporal and near the right occipital lobe, moderate to severe chronic microvascular ischemic changes.   Movement examination: Tone: There is normal tone in the UE/LE Abnormal movements:  no tremor.  No myoclonus.  No asterixis.   Coordination:  There is no decremation with RAM's. Normal finger to nose  Gait and Station: The patient has no difficulty arising out of a deep-seated chair without the use of the hands. The patient's stride length is good.  Gait is cautious and narrow.   Thank you for allowing [place] the opportunity to participate in the care of this nice patient. Please do not hesitate to contact us for any questions or concerns.   Total time spent on today's visit was 63 minutes dedicated to this patient today, preparing to see patient, examining the patient, ordering tests and/or medications and counseling the patient, documenting clinical information in the EHR or other health record, independently interpreting results and communicating results to the patient/family,  discussing treatment and goals, answering patient's questions and coordinating care.  Cc:  Renford Dills, MD  Marlowe Kays 01/11/2024 5:21 PM

## 2024-01-12 ENCOUNTER — Ambulatory Visit: Payer: PPO | Admitting: Physician Assistant

## 2024-01-12 DIAGNOSIS — M545 Low back pain, unspecified: Secondary | ICD-10-CM | POA: Diagnosis not present

## 2024-01-12 DIAGNOSIS — I6782 Cerebral ischemia: Secondary | ICD-10-CM | POA: Diagnosis not present

## 2024-01-12 DIAGNOSIS — R279 Unspecified lack of coordination: Secondary | ICD-10-CM | POA: Diagnosis not present

## 2024-01-12 DIAGNOSIS — R2681 Unsteadiness on feet: Secondary | ICD-10-CM | POA: Diagnosis not present

## 2024-01-12 DIAGNOSIS — G459 Transient cerebral ischemic attack, unspecified: Secondary | ICD-10-CM | POA: Diagnosis not present

## 2024-01-12 DIAGNOSIS — Z9181 History of falling: Secondary | ICD-10-CM | POA: Diagnosis not present

## 2024-01-12 DIAGNOSIS — M6281 Muscle weakness (generalized): Secondary | ICD-10-CM | POA: Diagnosis not present

## 2024-01-16 DIAGNOSIS — M545 Low back pain, unspecified: Secondary | ICD-10-CM | POA: Diagnosis not present

## 2024-01-16 DIAGNOSIS — G459 Transient cerebral ischemic attack, unspecified: Secondary | ICD-10-CM | POA: Diagnosis not present

## 2024-01-16 DIAGNOSIS — R2681 Unsteadiness on feet: Secondary | ICD-10-CM | POA: Diagnosis not present

## 2024-01-16 DIAGNOSIS — Z9181 History of falling: Secondary | ICD-10-CM | POA: Diagnosis not present

## 2024-01-16 DIAGNOSIS — M6281 Muscle weakness (generalized): Secondary | ICD-10-CM | POA: Diagnosis not present

## 2024-01-17 DIAGNOSIS — R41841 Cognitive communication deficit: Secondary | ICD-10-CM | POA: Diagnosis not present

## 2024-01-18 DIAGNOSIS — R279 Unspecified lack of coordination: Secondary | ICD-10-CM | POA: Diagnosis not present

## 2024-01-18 DIAGNOSIS — Z9181 History of falling: Secondary | ICD-10-CM | POA: Diagnosis not present

## 2024-01-18 DIAGNOSIS — I6782 Cerebral ischemia: Secondary | ICD-10-CM | POA: Diagnosis not present

## 2024-01-18 DIAGNOSIS — M6281 Muscle weakness (generalized): Secondary | ICD-10-CM | POA: Diagnosis not present

## 2024-01-19 DIAGNOSIS — R41841 Cognitive communication deficit: Secondary | ICD-10-CM | POA: Diagnosis not present

## 2024-01-20 DIAGNOSIS — M6281 Muscle weakness (generalized): Secondary | ICD-10-CM | POA: Diagnosis not present

## 2024-01-20 DIAGNOSIS — Z9181 History of falling: Secondary | ICD-10-CM | POA: Diagnosis not present

## 2024-01-20 DIAGNOSIS — R279 Unspecified lack of coordination: Secondary | ICD-10-CM | POA: Diagnosis not present

## 2024-01-20 DIAGNOSIS — I6782 Cerebral ischemia: Secondary | ICD-10-CM | POA: Diagnosis not present

## 2024-01-21 DIAGNOSIS — R41841 Cognitive communication deficit: Secondary | ICD-10-CM | POA: Diagnosis not present

## 2024-01-24 DIAGNOSIS — G459 Transient cerebral ischemic attack, unspecified: Secondary | ICD-10-CM | POA: Diagnosis not present

## 2024-01-24 DIAGNOSIS — M545 Low back pain, unspecified: Secondary | ICD-10-CM | POA: Diagnosis not present

## 2024-01-24 DIAGNOSIS — R41841 Cognitive communication deficit: Secondary | ICD-10-CM | POA: Diagnosis not present

## 2024-01-24 DIAGNOSIS — R2681 Unsteadiness on feet: Secondary | ICD-10-CM | POA: Diagnosis not present

## 2024-01-24 DIAGNOSIS — M6281 Muscle weakness (generalized): Secondary | ICD-10-CM | POA: Diagnosis not present

## 2024-01-24 DIAGNOSIS — Z9181 History of falling: Secondary | ICD-10-CM | POA: Diagnosis not present

## 2024-01-25 DIAGNOSIS — M6281 Muscle weakness (generalized): Secondary | ICD-10-CM | POA: Diagnosis not present

## 2024-01-25 DIAGNOSIS — I6782 Cerebral ischemia: Secondary | ICD-10-CM | POA: Diagnosis not present

## 2024-01-25 DIAGNOSIS — R279 Unspecified lack of coordination: Secondary | ICD-10-CM | POA: Diagnosis not present

## 2024-01-25 DIAGNOSIS — Z9181 History of falling: Secondary | ICD-10-CM | POA: Diagnosis not present

## 2024-01-26 DIAGNOSIS — R41841 Cognitive communication deficit: Secondary | ICD-10-CM | POA: Diagnosis not present

## 2024-01-28 DIAGNOSIS — I6782 Cerebral ischemia: Secondary | ICD-10-CM | POA: Diagnosis not present

## 2024-01-28 DIAGNOSIS — R279 Unspecified lack of coordination: Secondary | ICD-10-CM | POA: Diagnosis not present

## 2024-01-28 DIAGNOSIS — M6281 Muscle weakness (generalized): Secondary | ICD-10-CM | POA: Diagnosis not present

## 2024-01-28 DIAGNOSIS — R41841 Cognitive communication deficit: Secondary | ICD-10-CM | POA: Diagnosis not present

## 2024-01-28 DIAGNOSIS — Z9181 History of falling: Secondary | ICD-10-CM | POA: Diagnosis not present

## 2024-01-31 DIAGNOSIS — R41841 Cognitive communication deficit: Secondary | ICD-10-CM | POA: Diagnosis not present

## 2024-02-01 DIAGNOSIS — R279 Unspecified lack of coordination: Secondary | ICD-10-CM | POA: Diagnosis not present

## 2024-02-01 DIAGNOSIS — Z9181 History of falling: Secondary | ICD-10-CM | POA: Diagnosis not present

## 2024-02-01 DIAGNOSIS — R41841 Cognitive communication deficit: Secondary | ICD-10-CM | POA: Diagnosis not present

## 2024-02-01 DIAGNOSIS — M6281 Muscle weakness (generalized): Secondary | ICD-10-CM | POA: Diagnosis not present

## 2024-02-01 DIAGNOSIS — I6782 Cerebral ischemia: Secondary | ICD-10-CM | POA: Diagnosis not present

## 2024-02-08 ENCOUNTER — Telehealth: Payer: Self-pay

## 2024-02-08 DIAGNOSIS — M6281 Muscle weakness (generalized): Secondary | ICD-10-CM | POA: Diagnosis not present

## 2024-02-08 DIAGNOSIS — Z9181 History of falling: Secondary | ICD-10-CM | POA: Diagnosis not present

## 2024-02-08 DIAGNOSIS — I6782 Cerebral ischemia: Secondary | ICD-10-CM | POA: Diagnosis not present

## 2024-02-08 DIAGNOSIS — R279 Unspecified lack of coordination: Secondary | ICD-10-CM | POA: Diagnosis not present

## 2024-02-08 NOTE — Patient Outreach (Signed)
 First telephone outreach attempt to obtain mRS. No answer. Left message for returned call.  [facility]  Population Health Care Management Assistant  Direct Dial: [phone]  Fax: [phone] Website: [email or web address]

## 2024-02-14 ENCOUNTER — Telehealth: Payer: Self-pay

## 2024-02-14 DIAGNOSIS — R41841 Cognitive communication deficit: Secondary | ICD-10-CM | POA: Diagnosis not present

## 2024-02-14 NOTE — Patient Outreach (Signed)
 Second telephone outreach attempt to obtain mRS. No answer. Left message for returned call.  [facility]  Population Health Care Management Assistant  Direct Dial: [phone]  Fax: [phone] Website: [email or web address]

## 2024-02-15 DIAGNOSIS — R279 Unspecified lack of coordination: Secondary | ICD-10-CM | POA: Diagnosis not present

## 2024-02-15 DIAGNOSIS — Z9181 History of falling: Secondary | ICD-10-CM | POA: Diagnosis not present

## 2024-02-15 DIAGNOSIS — M6281 Muscle weakness (generalized): Secondary | ICD-10-CM | POA: Diagnosis not present

## 2024-02-15 DIAGNOSIS — I6782 Cerebral ischemia: Secondary | ICD-10-CM | POA: Diagnosis not present

## 2024-02-18 ENCOUNTER — Telehealth: Payer: Self-pay

## 2024-02-18 NOTE — Patient Outreach (Signed)
 3 outreach attempts were completed to obtain mRs. mRs could not be obtained because patient never returned my calls. mRs=7    Cindy Rhodes [facility] Care Management Assistant  Direct Dial: [phone]  Fax: [phone] Website: [email or web address]

## 2024-02-18 NOTE — Patient Outreach (Signed)
 [facility]  Population Health Care Management Assistant  Direct Dial: [phone]  Fax: [phone] Website: [email or web address]

## 2024-02-22 DIAGNOSIS — R279 Unspecified lack of coordination: Secondary | ICD-10-CM | POA: Diagnosis not present

## 2024-02-22 DIAGNOSIS — M6281 Muscle weakness (generalized): Secondary | ICD-10-CM | POA: Diagnosis not present

## 2024-02-22 DIAGNOSIS — I6782 Cerebral ischemia: Secondary | ICD-10-CM | POA: Diagnosis not present

## 2024-02-22 DIAGNOSIS — Z9181 History of falling: Secondary | ICD-10-CM | POA: Diagnosis not present

## 2024-02-25 DIAGNOSIS — I6782 Cerebral ischemia: Secondary | ICD-10-CM | POA: Diagnosis not present

## 2024-02-25 DIAGNOSIS — Z9181 History of falling: Secondary | ICD-10-CM | POA: Diagnosis not present

## 2024-02-25 DIAGNOSIS — M6281 Muscle weakness (generalized): Secondary | ICD-10-CM | POA: Diagnosis not present

## 2024-02-25 DIAGNOSIS — R279 Unspecified lack of coordination: Secondary | ICD-10-CM | POA: Diagnosis not present

## 2024-02-29 DIAGNOSIS — R41841 Cognitive communication deficit: Secondary | ICD-10-CM | POA: Diagnosis not present

## 2024-02-29 DIAGNOSIS — R279 Unspecified lack of coordination: Secondary | ICD-10-CM | POA: Diagnosis not present

## 2024-02-29 DIAGNOSIS — Z9181 History of falling: Secondary | ICD-10-CM | POA: Diagnosis not present

## 2024-02-29 DIAGNOSIS — I6782 Cerebral ischemia: Secondary | ICD-10-CM | POA: Diagnosis not present

## 2024-02-29 DIAGNOSIS — M6281 Muscle weakness (generalized): Secondary | ICD-10-CM | POA: Diagnosis not present

## 2024-03-07 DIAGNOSIS — Z9181 History of falling: Secondary | ICD-10-CM | POA: Diagnosis not present

## 2024-03-07 DIAGNOSIS — M6281 Muscle weakness (generalized): Secondary | ICD-10-CM | POA: Diagnosis not present

## 2024-03-07 DIAGNOSIS — R279 Unspecified lack of coordination: Secondary | ICD-10-CM | POA: Diagnosis not present

## 2024-03-07 DIAGNOSIS — I6782 Cerebral ischemia: Secondary | ICD-10-CM | POA: Diagnosis not present

## 2024-03-09 DIAGNOSIS — E871 Hypo-osmolality and hyponatremia: Secondary | ICD-10-CM | POA: Diagnosis not present

## 2024-03-09 DIAGNOSIS — I10 Essential (primary) hypertension: Secondary | ICD-10-CM | POA: Diagnosis not present

## 2024-03-09 DIAGNOSIS — M545 Low back pain, unspecified: Secondary | ICD-10-CM | POA: Diagnosis not present

## 2024-03-09 DIAGNOSIS — Z87440 Personal history of urinary (tract) infections: Secondary | ICD-10-CM | POA: Diagnosis not present

## 2024-03-10 DIAGNOSIS — Z87440 Personal history of urinary (tract) infections: Secondary | ICD-10-CM | POA: Diagnosis not present

## 2024-03-14 DIAGNOSIS — R41841 Cognitive communication deficit: Secondary | ICD-10-CM | POA: Diagnosis not present

## 2024-03-16 DIAGNOSIS — N390 Urinary tract infection, site not specified: Secondary | ICD-10-CM | POA: Diagnosis not present

## 2024-03-16 DIAGNOSIS — M545 Low back pain, unspecified: Secondary | ICD-10-CM | POA: Diagnosis not present

## 2024-03-16 DIAGNOSIS — I10 Essential (primary) hypertension: Secondary | ICD-10-CM | POA: Diagnosis not present

## 2024-03-21 DIAGNOSIS — Z9181 History of falling: Secondary | ICD-10-CM | POA: Diagnosis not present

## 2024-03-21 DIAGNOSIS — M6281 Muscle weakness (generalized): Secondary | ICD-10-CM | POA: Diagnosis not present

## 2024-03-21 DIAGNOSIS — R41841 Cognitive communication deficit: Secondary | ICD-10-CM | POA: Diagnosis not present

## 2024-03-21 DIAGNOSIS — I6782 Cerebral ischemia: Secondary | ICD-10-CM | POA: Diagnosis not present

## 2024-03-21 DIAGNOSIS — R279 Unspecified lack of coordination: Secondary | ICD-10-CM | POA: Diagnosis not present

## 2024-04-20 DIAGNOSIS — N1831 Chronic kidney disease, stage 3a: Secondary | ICD-10-CM | POA: Diagnosis not present

## 2024-04-20 DIAGNOSIS — G3184 Mild cognitive impairment, so stated: Secondary | ICD-10-CM | POA: Diagnosis not present

## 2024-04-20 DIAGNOSIS — I10 Essential (primary) hypertension: Secondary | ICD-10-CM | POA: Diagnosis not present

## 2024-04-20 DIAGNOSIS — E1169 Type 2 diabetes mellitus with other specified complication: Secondary | ICD-10-CM | POA: Diagnosis not present

## 2024-04-20 DIAGNOSIS — M545 Low back pain, unspecified: Secondary | ICD-10-CM | POA: Diagnosis not present

## 2024-04-20 DIAGNOSIS — E7800 Pure hypercholesterolemia, unspecified: Secondary | ICD-10-CM | POA: Diagnosis not present

## 2024-04-25 DIAGNOSIS — M545 Low back pain, unspecified: Secondary | ICD-10-CM | POA: Diagnosis not present

## 2024-05-19 ENCOUNTER — Observation Stay (HOSPITAL_COMMUNITY)
Admission: EM | Admit: 2024-05-19 | Discharge: 2024-05-20 | Disposition: A | Discharge status: Home / Self Care | Attending: Family Medicine | Admitting: Family Medicine

## 2024-05-19 ENCOUNTER — Other Ambulatory Visit: Payer: Self-pay

## 2024-05-19 ENCOUNTER — Encounter (HOSPITAL_COMMUNITY): Payer: Self-pay

## 2024-05-19 ENCOUNTER — Emergency Department (HOSPITAL_COMMUNITY)
Admission: EM | Admit: 2024-05-19 | Discharge: 2024-05-19 | Disposition: A | Discharge status: Home / Self Care | Source: Home / Self Care

## 2024-05-19 DIAGNOSIS — E785 Hyperlipidemia, unspecified: Secondary | ICD-10-CM | POA: Diagnosis not present

## 2024-05-19 DIAGNOSIS — R5383 Other fatigue: Secondary | ICD-10-CM | POA: Insufficient documentation

## 2024-05-19 DIAGNOSIS — M545 Low back pain, unspecified: Secondary | ICD-10-CM | POA: Insufficient documentation

## 2024-05-19 DIAGNOSIS — I10 Essential (primary) hypertension: Secondary | ICD-10-CM | POA: Diagnosis not present

## 2024-05-19 DIAGNOSIS — R5381 Other malaise: Secondary | ICD-10-CM | POA: Diagnosis not present

## 2024-05-19 DIAGNOSIS — Z87891 Personal history of nicotine dependence: Secondary | ICD-10-CM | POA: Insufficient documentation

## 2024-05-19 DIAGNOSIS — E1165 Type 2 diabetes mellitus with hyperglycemia: Secondary | ICD-10-CM | POA: Diagnosis not present

## 2024-05-19 DIAGNOSIS — R110 Nausea: Secondary | ICD-10-CM | POA: Diagnosis not present

## 2024-05-19 DIAGNOSIS — E876 Hypokalemia: Secondary | ICD-10-CM | POA: Diagnosis present

## 2024-05-19 DIAGNOSIS — R2681 Unsteadiness on feet: Secondary | ICD-10-CM | POA: Diagnosis not present

## 2024-05-19 DIAGNOSIS — Z79899 Other long term (current) drug therapy: Secondary | ICD-10-CM | POA: Insufficient documentation

## 2024-05-19 DIAGNOSIS — Z7902 Long term (current) use of antithrombotics/antiplatelets: Secondary | ICD-10-CM | POA: Insufficient documentation

## 2024-05-19 DIAGNOSIS — E871 Hypo-osmolality and hyponatremia: Principal | ICD-10-CM | POA: Diagnosis present

## 2024-05-19 DIAGNOSIS — Z7984 Long term (current) use of oral hypoglycemic drugs: Secondary | ICD-10-CM | POA: Diagnosis not present

## 2024-05-19 DIAGNOSIS — R531 Weakness: Secondary | ICD-10-CM | POA: Diagnosis present

## 2024-05-19 DIAGNOSIS — Z8673 Personal history of transient ischemic attack (TIA), and cerebral infarction without residual deficits: Secondary | ICD-10-CM | POA: Diagnosis not present

## 2024-05-19 DIAGNOSIS — N390 Urinary tract infection, site not specified: Secondary | ICD-10-CM | POA: Insufficient documentation

## 2024-05-19 DIAGNOSIS — M6281 Muscle weakness (generalized): Secondary | ICD-10-CM | POA: Insufficient documentation

## 2024-05-19 DIAGNOSIS — G43909 Migraine, unspecified, not intractable, without status migrainosus: Secondary | ICD-10-CM | POA: Diagnosis not present

## 2024-05-19 DIAGNOSIS — Z7982 Long term (current) use of aspirin: Secondary | ICD-10-CM | POA: Insufficient documentation

## 2024-05-19 DIAGNOSIS — N3020 Other chronic cystitis without hematuria: Secondary | ICD-10-CM | POA: Diagnosis not present

## 2024-05-19 DIAGNOSIS — I160 Hypertensive urgency: Secondary | ICD-10-CM | POA: Diagnosis not present

## 2024-05-19 LAB — COMPREHENSIVE METABOLIC PANEL WITH GFR
ALT: 18 U/L (ref 0–44)
AST: 26 U/L (ref 15–41)
Albumin: 4.4 g/dL (ref 3.5–5.0)
Alkaline Phosphatase: 65 U/L (ref 38–126)
Anion gap: 15 (ref 5–15)
BUN: 15 mg/dL (ref 8–23)
CO2: 19 mmol/L — ABNORMAL LOW (ref 22–32)
Calcium: 9.3 mg/dL (ref 8.9–10.3)
Chloride: 91 mmol/L — ABNORMAL LOW (ref 98–111)
Creatinine, Ser: 0.86 mg/dL (ref 0.44–1.00)
GFR, Estimated: >60 mL/min (ref >60)
Glucose, Bld: 121 mg/dL — ABNORMAL HIGH (ref 70–99)
Potassium: 3.3 mmol/L — ABNORMAL LOW (ref 3.5–5.1)
Sodium: 125 mmol/L — ABNORMAL LOW (ref 135–145)
Total Bilirubin: 1 mg/dL (ref 0.0–1.2)
Total Protein: 7.7 g/dL (ref 6.5–8.1)

## 2024-05-19 LAB — CBC
HCT: 35.6 % — ABNORMAL LOW (ref 36.0–46.0)
Hemoglobin: 11.7 g/dL — ABNORMAL LOW (ref 12.0–15.0)
MCH: 30.3 pg (ref 26.0–34.0)
MCHC: 32.9 g/dL (ref 30.0–36.0)
MCV: 92.2 fL (ref 80.0–100.0)
Platelets: 263 10*3/uL (ref 150–400)
RBC: 3.86 MIL/uL — ABNORMAL LOW (ref 3.87–5.11)
RDW: 13.9 % (ref 11.5–15.5)
WBC: 9.3 10*3/uL (ref 4.0–10.5)
nRBC: 0 % (ref 0.0–0.2)

## 2024-05-19 MED ORDER — LACTATED RINGERS IV BOLUS
1000.0000 mL | Freq: Once | INTRAVENOUS | Status: AC
  Administered 2024-05-20: 1000 mL via INTRAVENOUS

## 2024-05-19 MED ORDER — ONDANSETRON HCL 4 MG/2ML IJ SOLN
4.0000 mg | Freq: Once | INTRAMUSCULAR | Status: AC
  Administered 2024-05-20: 4 mg via INTRAVENOUS
  Filled 2024-05-19: qty 2

## 2024-05-19 NOTE — ED Provider Notes (Signed)
 Cindy Rhodes Provider Note   CSN: [id number] Arrival date & time: 05/19/24  1752     Patient presents with: Abnormal Labs   Cindy Rhodes is a 78 y.o. female who presents to the ED today with concerns that an injected ceftriaxone  dose given for UTI diagnosed at urology was insufficient to manage her symptoms as she does not have significant improvement.  She was also prescribed an outpatient course of cefdinir for continued outpatient therapy.  She has no new symptoms, endorses general malaise and fatigue.  No nausea or vomiting   HPI     Prior to Admission medications   Medication Sig Start Date End Date Taking? Authorizing Provider  amLODipine  (NORVASC ) 5 MG tablet Take 1 tablet (5 mg total) by mouth daily. 03/28/23 01/11/24  Ozell Blunt, MD  Ascorbic Acid (VITAMIN C) 500 MG CAPS Take 500 mg by mouth daily.    [provider]  aspirin  EC 81 MG tablet Take 1 tablet (81 mg total) by mouth daily. Swallow whole. 11/08/23   Consuelo Denmark, MD  Calcium  Carbonate-Vitamin D  600-400 MG-UNIT tablet Take 1 tablet by mouth daily.    [provider]  clopidogrel  (PLAVIX ) 75 MG tablet Take 1 tablet (75 mg total) by mouth daily. 11/07/23   Consuelo Denmark, MD  Coenzyme Q10 (CO Q 10 PO) Take 1 capsule by mouth daily.    [provider]  cyanocobalamin  1000 MCG tablet Take 1 tablet (1,000 mcg total) by mouth daily. 11/08/23   Consuelo Denmark, MD  famotidine  (PEPCID ) 20 MG tablet Take 20 mg by mouth daily.    [provider]  glucosamine-chondroitin 500-400 MG tablet Take 3 tablets by mouth daily.     [provider]  memantine  (NAMENDA ) 10 MG tablet TAKE 1 TABLET BY MOUTH EVERY NIGHT FOR 2 WEEKS, THEN INCREASE TO TAKE 1 TABLET BY MOUTH TWO TIMES A DAY 01/11/24   Alane Allen, Sara E, PA-C  metFORMIN  (GLUCOPHAGE -XR) 500 MG 24 hr tablet Take 1,000 mg by mouth 2 (two) times daily. 07/01/19   [provider]  metoprolol  tartrate  (LOPRESSOR ) 25 MG tablet Take 25 mg by mouth 2 (two) times daily.  09/30/13   [provider]  Misc Natural Products (TART CHERRY ADVANCED) CAPS Take 1 capsule by mouth daily.    [provider]  Multiple Vitamin (MULTIVITAMIN WITH MINERALS) TABS tablet Take 1 tablet by mouth daily.    [provider]  oxybutynin  (DITROPAN -XL) 10 MG 24 hr tablet Take 10 mg by mouth daily.  12/28/18   [provider]  potassium chloride  SA (KLOR-CON  M) 20 MEQ tablet Take 2 tablets (40 mEq total) by mouth 2 (two) times daily for 2 days. 11/07/23 11/09/23  Consuelo Denmark, MD  rosuvastatin  (CRESTOR ) 5 MG tablet Take 1 tablet (5 mg total) by mouth at bedtime. 11/07/23   Consuelo Denmark, MD  sertraline  (ZOLOFT ) 50 MG tablet Take 50 mg by mouth daily. 07/22/13   [provider]  spironolactone  (ALDACTONE ) 25 MG tablet Take 0.5 tablets (12.5 mg total) by mouth daily. 03/29/23   Ozell Blunt, MD  telmisartan  (MICARDIS ) 80 MG tablet Take 1 tablet (80 mg total) by mouth daily. 08/18/19   Carlton Chick, MD  telmisartan -hydrochlorothiazide (MICARDIS  HCT) 80-25 MG tablet Take 1 tablet by mouth daily.  07/05/18 08/18/19  [provider]    Allergies: Hydrochlorothiazide, Meloxicam, Pork-derived products, and Sulfa antibiotics    Review of Systems  Constitutional:  Positive for activity change. Negative for appetite change.  Genitourinary:  Positive for difficulty urinating and dysuria.    Updated Vital Signs BP (!) 180/105   Pulse 85   Temp 98.4 F (36.9 C) (Oral)   Resp 12   SpO2 100%   Physical Exam Vitals and nursing note reviewed.  Constitutional:      General: She is not in acute distress.    Appearance: She is well-developed.  HENT:     Head: Normocephalic and atraumatic.   Eyes:     Conjunctiva/sclera: Conjunctivae normal.    Cardiovascular:     Rate and Rhythm: Normal rate and regular rhythm.     Heart sounds: No murmur heard. Pulmonary:     Effort: Pulmonary  effort is normal. No respiratory distress.     Breath sounds: Normal breath sounds.  Abdominal:     Palpations: Abdomen is soft.     Tenderness: There is no abdominal tenderness.   Musculoskeletal:        General: No swelling.     Cervical back: Neck supple.   Skin:    General: Skin is warm and dry.     Capillary Refill: Capillary refill takes less than 2 seconds.   Neurological:     Mental Status: She is alert.   Psychiatric:        Mood and Affect: Mood normal.     (all labs ordered are listed, but only abnormal results are displayed) Labs Reviewed - No data to display  EKG: None  Radiology: No results found.   Procedures   Medications Ordered in the ED - No data to display                                  Medical Decision Making  Medical Decision Making:   Cindy Rhodes is a 78 y.o. female who presented to the ED today with malaise secondary to previously diagnosed UTI detailed above.     Complete initial physical exam performed, notably the patient  was alert and oriented in no apparent distress.  There is some delayed skin turgor however remainder physical exam is unremarkable.     Reviewed and confirmed nursing documentation for past medical history, family history, social history.    Initial Assessment:   With the patient's presentation of general malaise, most likely diagnosis is malaise secondary to cystitis.  Initial Plan:  Based on clinical presentation and physical exam, further workup is deferred at this time.  Previously diagnosed UTI, no further workup indicated for diagnosis of this condition. Reassessment and Plan:   Discussed oral hydration with patient, follow-up with primary care, provided reassurance as antibiotic therapy is new onset.  Encouraged to continue current therapy and follow-up with primary care if further concerns.  Reevaluate at ED should symptoms worsen or new symptoms appear.       Final diagnoses:  Malaise and fatigue     ED Discharge Orders     None          Cindy Rhodes, [place] 05/19/24 [phone]

## 2024-05-19 NOTE — ED Provider Notes (Signed)
 [place] EMERGENCY DEPARTMENT AT [facility] Provider Note  CSN: [id number] Arrival date & time: 05/19/24 2135  Chief Complaint(s) Weakness  HPI Cindy Rhodes is a 78 y.o. female with a past medical history listed below who presents to the emergency department with generalized malaise, nausea without emesis.  Patient is currently being treated for urinary tract infection noted earlier today at urologist office.  Patient was given IM Rocephin  and prescribed cefdinir.  Following that patient began feeling significantly worse prompting a visit to the emergency department earlier today.  She returns with concern for low sodium levels as this is similar to her prior episodes.  The history is provided by the patient.    Past Medical History Past Medical History:  Diagnosis Date   Acute lower UTI 08/12/2017   Acute metabolic encephalopathy 03/18/2023   Atrophy of vagina 05/14/2021   Cerebrovascular disease    moderate to severe per MRI   DDD (degenerative disc disease), cervical 10/13/2019   Decreased estrogen level 05/14/2021   Diabetic renal disease 05/14/2021   Essential hypertension 08/12/2017   Generalized anxiety disorder 05/14/2021   Hypertensive crisis 03/18/2023   Hypokalemia 08/12/2017   Hypomagnesemia 03/18/2023   Hyponatremia 08/12/2017   Interstitial cystitis 05/14/2021   Leukocytosis 05/14/2021   Major depressive disorder in remission 05/14/2021   Migraine without aura, not refractory 05/14/2021   Mild cognitive impairment of uncertain or unknown etiology 05/27/2023   Neck pain 08/28/2019   Osteopenia 05/14/2021   Pain in right hand 05/08/2019   Pincer nail deformity 01/24/2020   Prolonged QT interval 03/18/2023   Pure hypercholesterolemia 05/14/2021   Spinal stenosis in cervical region 10/13/2019   Subacute vulvitis 05/14/2021   Trigger finger of right hand 05/08/2019   Type 2 diabetes mellitus with hyperglycemia 08/12/2017   Urge incontinence of  urine 05/14/2021   Patient Active Problem List   Diagnosis Date Noted   Stroke determined by clinical assessment (HCC) 11/04/2023   Cerebrovascular disease    Mild cognitive impairment of uncertain or unknown etiology 05/27/2023   Hypertensive crisis 03/18/2023   Acute metabolic encephalopathy 03/18/2023   Hypomagnesemia 03/18/2023   Prolonged QT interval 03/18/2023   Generalized anxiety disorder 05/14/2021   Atrophy of vagina 05/14/2021   Decreased estrogen level 05/14/2021   Diabetic renal disease 05/14/2021   Interstitial cystitis 05/14/2021   Leukocytosis 05/14/2021   Major depressive disorder in remission 05/14/2021   Osteopenia 05/14/2021   Pure hypercholesterolemia 05/14/2021   Subacute vulvitis 05/14/2021   Urge incontinence of urine 05/14/2021   Pincer nail deformity 01/24/2020   DDD (degenerative disc disease), cervical 10/13/2019   Spinal stenosis in cervical region 10/13/2019   Hypokalemia 08/12/2017   Hyponatremia 08/12/2017   Essential hypertension 08/12/2017   Type 2 diabetes mellitus with hyperglycemia 08/12/2017   Home Medication(s) Prior to Admission medications   Medication Sig Start Date End Date Taking? Authorizing Provider  amLODipine  (NORVASC ) 5 MG tablet Take 1 tablet (5 mg total) by mouth daily. 03/28/23 01/11/24  Drusilla Sabas RAMAN, MD  Ascorbic Acid (VITAMIN C) 500 MG CAPS Take 500 mg by mouth daily.    [provider]  aspirin  EC 81 MG tablet Take 1 tablet (81 mg total) by mouth daily. Swallow whole. 11/08/23   Jerri Pfeiffer, MD  Calcium  Carbonate-Vitamin D  600-400 MG-UNIT tablet Take 1 tablet by mouth daily.    [provider]  clopidogrel  (PLAVIX ) 75 MG tablet Take 1 tablet (75 mg total) by mouth daily. 11/07/23  Jerri Pfeiffer, MD  Coenzyme Q10 (CO Q 10 PO) Take 1 capsule by mouth daily.    [provider]  cyanocobalamin  1000 MCG tablet Take 1 tablet (1,000 mcg total) by mouth daily. 11/08/23   Jerri Pfeiffer, MD  famotidine  (PEPCID )  20 MG tablet Take 20 mg by mouth daily.    [provider]  glucosamine-chondroitin 500-400 MG tablet Take 3 tablets by mouth daily.     [provider]  memantine  (NAMENDA ) 10 MG tablet TAKE 1 TABLET BY MOUTH EVERY NIGHT FOR 2 WEEKS, THEN INCREASE TO TAKE 1 TABLET BY MOUTH TWO TIMES A DAY 01/11/24   Dina, Sara E, PA-C  metFORMIN  (GLUCOPHAGE -XR) 500 MG 24 hr tablet Take 1,000 mg by mouth 2 (two) times daily. 07/01/19   [provider]  metoprolol  tartrate (LOPRESSOR ) 25 MG tablet Take 25 mg by mouth 2 (two) times daily.  09/30/13   [provider]  Misc Natural Products (TART CHERRY ADVANCED) CAPS Take 1 capsule by mouth daily.    [provider]  Multiple Vitamin (MULTIVITAMIN WITH MINERALS) TABS tablet Take 1 tablet by mouth daily.    [provider]  oxybutynin  (DITROPAN -XL) 10 MG 24 hr tablet Take 10 mg by mouth daily.  12/28/18   [provider]  potassium chloride  SA (KLOR-CON  M) 20 MEQ tablet Take 2 tablets (40 mEq total) by mouth 2 (two) times daily for 2 days. 11/07/23 11/09/23  Jerri Pfeiffer, MD  rosuvastatin  (CRESTOR ) 5 MG tablet Take 1 tablet (5 mg total) by mouth at bedtime. 11/07/23   Jerri Pfeiffer, MD  sertraline  (ZOLOFT ) 50 MG tablet Take 50 mg by mouth daily. 07/22/13   [provider]  spironolactone  (ALDACTONE ) 25 MG tablet Take 0.5 tablets (12.5 mg total) by mouth daily. 03/29/23   Drusilla Sabas RAMAN, MD  telmisartan  (MICARDIS ) 80 MG tablet Take 1 tablet (80 mg total) by mouth daily. 08/18/19   Lorriane Holmes, MD  telmisartan -hydrochlorothiazide (MICARDIS  HCT) 80-25 MG tablet Take 1 tablet by mouth daily.  07/05/18 08/18/19  [provider]                                                                                                                                    Allergies Hydrochlorothiazide, Meloxicam, Pork-derived products, and Sulfa antibiotics  Review of Systems Review of Systems As noted in  HPI  Physical Exam Vital Signs  I have reviewed the triage vital signs BP (!) 196/114   Pulse 88   Temp 98 F (36.7 C)   Resp 16   SpO2 100%   Physical Exam Vitals reviewed.  Constitutional:      General: She is not in acute distress.    Appearance: She is well-developed. She is not diaphoretic.  HENT:     Head: Normocephalic and atraumatic.     Right Ear: External ear normal.     Left Ear: External ear normal.  Nose: Nose normal.   Eyes:     General: No scleral icterus.    Conjunctiva/sclera: Conjunctivae normal.   Neck:     Trachea: Phonation normal.   Cardiovascular:     Rate and Rhythm: Normal rate and regular rhythm.  Pulmonary:     Effort: Pulmonary effort is normal. No respiratory distress.     Breath sounds: No stridor.  Abdominal:     General: There is no distension.     Tenderness: There is no abdominal tenderness.   Musculoskeletal:        General: Normal range of motion.     Cervical back: Normal range of motion.   Neurological:     Mental Status: She is alert and oriented to person, place, and time.   Psychiatric:        Behavior: Behavior normal.     ED Results and Treatments Labs (all labs ordered are listed, but only abnormal results are displayed) Labs Reviewed  COMPREHENSIVE METABOLIC PANEL WITH GFR - Abnormal; Notable for the following components:      Result Value   Sodium 125 (*)    Potassium 3.3 (*)    Chloride 91 (*)    CO2 19 (*)    Glucose, Bld 121 (*)    All other components within normal limits  CBC - Abnormal; Notable for the following components:   RBC 3.86 (*)    Hemoglobin 11.7 (*)    HCT 35.6 (*)    All other components within normal limits  OSMOLALITY  NA AND K (SODIUM & POTASSIUM), RAND UR  OSMOLALITY, URINE  URINALYSIS, W/ REFLEX TO CULTURE (INFECTION SUSPECTED)  I-STAT CG4 LACTIC ACID, ED                                                                                                                          EKG  EKG Interpretation Date/Time:  Saturday May 20 2024 00:19:51 EDT Ventricular Rate:  83 PR Interval:  125 QRS Duration:  84 QT Interval:  328 QTC Calculation: 386 R Axis:   49  Text Interpretation: Sinus rhythm Low voltage, precordial leads Borderline repolarization abnormality No significant change was found Confirmed by Trine Likes ([id number]) on 05/20/2024 12:31:25 AM       Radiology No results found.  Medications Ordered in ED Medications  lactated ringers  bolus 1,000 mL (1,000 mLs Intravenous New Bag/Given 05/20/24 0024)  ondansetron  (ZOFRAN ) injection 4 mg (4 mg Intravenous Given 05/20/24 0024)   Procedures Procedures  (including critical care time) Medical Decision Making / ED Course   Medical Decision Making Amount and/or Complexity of Data Reviewed Labs: ordered. Decision-making details documented in ED Course. ECG/medicine tests: ordered and independent interpretation performed. Decision-making details documented in ED Course.  Risk Prescription drug management. Decision regarding hospitalization.    Patient presents with generalized fatigue and malaise Differential diagnosis considered.  Workup below.  Workup is consistent with hyponatremia at 125.  10 point drop from 6 months ago.  Mild hypokalemia.  No other electrolyte derangements or renal sufficiency.  CBC without leukocytosis.  Stable hemoglobin.  Lactic acid normal.  Unlikely sepsis from UTI.  Clinically not consistent with pyelonephritis.  EKG without acute ischemic changes, dysrhythmias or blocks.  Will provide patient with LR. Plan to admit     Final Clinical Impression(s) / ED Diagnoses Final diagnoses:  Hyponatremia syndrome    This chart was dictated using voice recognition software.  Despite best efforts to proofread,  errors can occur which can change the documentation meaning.    Trine Raynell Moder, MD 05/20/24 [phone]

## 2024-05-19 NOTE — ED Triage Notes (Signed)
 Pt is coming in for general weakness, she was seen today for UTI symptoms and was Dx with a UTI, but she mentions they did not get blood on her at that time. She mentions that she does have a Hx of low sodium and it seems similar to previous episodes of when she has had low sodium. She is otherwise stable at this time in triage with no other complaints.

## 2024-05-19 NOTE — ED Triage Notes (Signed)
 Pt came in for weakness that's started after getting an IM shot of Ceftriaxone  for her UTI diagnosis today. Pt was with her urologist earlier and was prescribed medication to be taken after today. However, pt has been feeling worse after receiving the shot, but does not have any complaints of pain.

## 2024-05-20 DIAGNOSIS — N390 Urinary tract infection, site not specified: Secondary | ICD-10-CM | POA: Diagnosis not present

## 2024-05-20 DIAGNOSIS — R531 Weakness: Secondary | ICD-10-CM

## 2024-05-20 DIAGNOSIS — E871 Hypo-osmolality and hyponatremia: Secondary | ICD-10-CM | POA: Diagnosis not present

## 2024-05-20 LAB — BASIC METABOLIC PANEL WITH GFR
Anion gap: 16 — ABNORMAL HIGH (ref 5–15)
Anion gap: 12 (ref 5–15)
Anion gap: 8 (ref 5–15)
BUN: 14 mg/dL (ref 8–23)
BUN: 13 mg/dL (ref 8–23)
BUN: 12 mg/dL (ref 8–23)
CO2: 20 mmol/L — ABNORMAL LOW (ref 22–32)
CO2: 24 mmol/L (ref 22–32)
CO2: 20 mmol/L — ABNORMAL LOW (ref 22–32)
Calcium: 9.4 mg/dL (ref 8.9–10.3)
Calcium: 9.2 mg/dL (ref 8.9–10.3)
Calcium: 7.7 mg/dL — ABNORMAL LOW (ref 8.9–10.3)
Chloride: 93 mmol/L — ABNORMAL LOW (ref 98–111)
Chloride: 91 mmol/L — ABNORMAL LOW (ref 98–111)
Chloride: 101 mmol/L (ref 98–111)
Creatinine, Ser: 0.91 mg/dL (ref 0.44–1.00)
Creatinine, Ser: 0.96 mg/dL (ref 0.44–1.00)
Creatinine, Ser: 0.84 mg/dL (ref 0.44–1.00)
GFR, Estimated: >60 mL/min (ref >60)
GFR, Estimated: >60 mL/min (ref >60)
GFR, Estimated: >60 mL/min (ref >60)
Glucose, Bld: 108 mg/dL — ABNORMAL HIGH (ref 70–99)
Glucose, Bld: 140 mg/dL — ABNORMAL HIGH (ref 70–99)
Glucose, Bld: 115 mg/dL — ABNORMAL HIGH (ref 70–99)
Potassium: 3.1 mmol/L — ABNORMAL LOW (ref 3.5–5.1)
Potassium: 3.5 mmol/L (ref 3.5–5.1)
Potassium: 3.6 mmol/L (ref 3.5–5.1)
Sodium: 129 mmol/L — ABNORMAL LOW (ref 135–145)
Sodium: 127 mmol/L — ABNORMAL LOW (ref 135–145)
Sodium: 129 mmol/L — ABNORMAL LOW (ref 135–145)

## 2024-05-20 LAB — CBG MONITORING, ED
Glucose-Capillary: 153 mg/dL — ABNORMAL HIGH (ref 70–99)
Glucose-Capillary: 126 mg/dL — ABNORMAL HIGH (ref 70–99)

## 2024-05-20 LAB — CBC
HCT: 29.1 % — ABNORMAL LOW (ref 36.0–46.0)
Hemoglobin: 10.2 g/dL — ABNORMAL LOW (ref 12.0–15.0)
MCH: 29.6 pg (ref 26.0–34.0)
MCHC: 35.1 g/dL (ref 30.0–36.0)
MCV: 84.3 fL (ref 80.0–100.0)
Platelets: 236 10*3/uL (ref 150–400)
RBC: 3.45 MIL/uL — ABNORMAL LOW (ref 3.87–5.11)
RDW: 13.6 % (ref 11.5–15.5)
WBC: 9.9 10*3/uL (ref 4.0–10.5)
nRBC: 0 % (ref 0.0–0.2)

## 2024-05-20 LAB — COMPREHENSIVE METABOLIC PANEL WITH GFR
ALT: 13 U/L (ref 0–44)
AST: 22 U/L (ref 15–41)
Albumin: 3.2 g/dL — ABNORMAL LOW (ref 3.5–5.0)
Alkaline Phosphatase: 48 U/L (ref 38–126)
Anion gap: 13 (ref 5–15)
BUN: 12 mg/dL (ref 8–23)
CO2: 18 mmol/L — ABNORMAL LOW (ref 22–32)
Calcium: 7.8 mg/dL — ABNORMAL LOW (ref 8.9–10.3)
Chloride: 103 mmol/L (ref 98–111)
Creatinine, Ser: 0.64 mg/dL (ref 0.44–1.00)
GFR, Estimated: >60 mL/min (ref >60)
Glucose, Bld: 113 mg/dL — ABNORMAL HIGH (ref 70–99)
Potassium: 2.6 mmol/L — CL (ref 3.5–5.1)
Sodium: 134 mmol/L — ABNORMAL LOW (ref 135–145)
Total Bilirubin: 0.4 mg/dL (ref 0.0–1.2)
Total Protein: 5.7 g/dL — ABNORMAL LOW (ref 6.5–8.1)

## 2024-05-20 LAB — URINALYSIS, W/ REFLEX TO CULTURE (INFECTION SUSPECTED)
Bacteria, UA: NONE SEEN
Bilirubin Urine: NEGATIVE
Glucose, UA: NEGATIVE mg/dL
Hgb urine dipstick: NEGATIVE
Ketones, ur: NEGATIVE mg/dL
Nitrite: NEGATIVE
Protein, ur: NEGATIVE mg/dL
Specific Gravity, Urine: 1.002 — ABNORMAL LOW (ref 1.005–1.030)
pH: 8 (ref 5.0–8.0)

## 2024-05-20 LAB — NA AND K (SODIUM & POTASSIUM), RAND UR
Potassium Urine: 12 mmol/L
Sodium, Ur: 42 mmol/L

## 2024-05-20 LAB — BRAIN NATRIURETIC PEPTIDE: B Natriuretic Peptide: 42.1 pg/mL (ref 0.0–100.0)

## 2024-05-20 LAB — PHOSPHORUS: Phosphorus: 3 mg/dL (ref 2.5–4.6)

## 2024-05-20 LAB — TSH: TSH: 2.2 u[IU]/mL (ref 0.350–4.500)

## 2024-05-20 LAB — MAGNESIUM
Magnesium: 1.6 mg/dL — ABNORMAL LOW (ref 1.7–2.4)
Magnesium: 4.2 mg/dL — ABNORMAL HIGH (ref 1.7–2.4)
Magnesium: 2.7 mg/dL — ABNORMAL HIGH (ref 1.7–2.4)

## 2024-05-20 LAB — OSMOLALITY, URINE: Osmolality, Ur: 142 mosm/kg — ABNORMAL LOW (ref 300–900)

## 2024-05-20 LAB — I-STAT CG4 LACTIC ACID, ED: Lactic Acid, Venous: 1.7 mmol/L (ref 0.5–1.9)

## 2024-05-20 LAB — OSMOLALITY: Osmolality: 274 mosm/kg — ABNORMAL LOW (ref 275–295)

## 2024-05-20 MED ORDER — AMLODIPINE BESYLATE 5 MG PO TABS
5.0000 mg | ORAL_TABLET | Freq: Once | ORAL | Status: AC
  Administered 2024-05-20: 5 mg via ORAL
  Filled 2024-05-20: qty 1

## 2024-05-20 MED ORDER — AMLODIPINE BESYLATE 5 MG PO TABS
5.0000 mg | ORAL_TABLET | Freq: Every day | ORAL | Status: DC
  Administered 2024-05-20: 5 mg via ORAL
  Filled 2024-05-20: qty 1

## 2024-05-20 MED ORDER — ALBUTEROL SULFATE (2.5 MG/3ML) 0.083% IN NEBU: 2.5000 mg | INHALATION_SOLUTION | RESPIRATORY_TRACT | Status: DC | PRN

## 2024-05-20 MED ORDER — METOPROLOL TARTRATE 25 MG PO TABS
25.0000 mg | ORAL_TABLET | Freq: Two times a day (BID) | ORAL | Status: DC
  Administered 2024-05-20: 25 mg via ORAL
  Filled 2024-05-20: qty 1

## 2024-05-20 MED ORDER — MAGNESIUM SULFATE 4 GM/100ML IV SOLN
4.0000 g | Freq: Once | INTRAVENOUS | Status: AC
  Administered 2024-05-20: 4 g via INTRAVENOUS
  Filled 2024-05-20: qty 100

## 2024-05-20 MED ORDER — POTASSIUM CHLORIDE CRYS ER 20 MEQ PO TBCR
40.0000 meq | EXTENDED_RELEASE_TABLET | ORAL | Status: AC
  Administered 2024-05-20 (×3): 40 meq via ORAL
  Filled 2024-05-20 (×3): qty 2

## 2024-05-20 MED ORDER — MEMANTINE HCL 5 MG PO TABS
10.0000 mg | ORAL_TABLET | Freq: Two times a day (BID) | ORAL | Status: DC
  Administered 2024-05-20: 10 mg via ORAL
  Filled 2024-05-20: qty 2

## 2024-05-20 MED ORDER — ADULT MULTIVITAMIN W/MINERALS CH
1.0000 | ORAL_TABLET | Freq: Every day | ORAL | Status: DC
  Administered 2024-05-20: 1 via ORAL
  Filled 2024-05-20: qty 1

## 2024-05-20 MED ORDER — AMLODIPINE BESYLATE 10 MG PO TABS: 10.0000 mg | ORAL_TABLET | Freq: Every day | ORAL | 11 refills | Status: AC

## 2024-05-20 MED ORDER — IRBESARTAN 300 MG PO TABS
300.0000 mg | ORAL_TABLET | Freq: Every day | ORAL | Status: DC
  Administered 2024-05-20: 300 mg via ORAL
  Filled 2024-05-20: qty 1

## 2024-05-20 MED ORDER — POTASSIUM CHLORIDE CRYS ER 20 MEQ PO TBCR
40.0000 meq | EXTENDED_RELEASE_TABLET | Freq: Two times a day (BID) | ORAL | Status: DC
  Filled 2024-05-20: qty 2

## 2024-05-20 MED ORDER — FAMOTIDINE 20 MG PO TABS
20.0000 mg | ORAL_TABLET | Freq: Every day | ORAL | Status: DC
  Administered 2024-05-20: 20 mg via ORAL
  Filled 2024-05-20: qty 1

## 2024-05-20 MED ORDER — ROSUVASTATIN CALCIUM 5 MG PO TABS
5.0000 mg | ORAL_TABLET | Freq: Every day | ORAL | Status: DC
  Filled 2024-05-20: qty 1

## 2024-05-20 MED ORDER — OXYBUTYNIN CHLORIDE ER 10 MG PO TB24
10.0000 mg | ORAL_TABLET | Freq: Every day | ORAL | Status: DC
  Administered 2024-05-20: 10 mg via ORAL
  Filled 2024-05-20: qty 1

## 2024-05-20 MED ORDER — ASPIRIN 81 MG PO TBEC
81.0000 mg | DELAYED_RELEASE_TABLET | Freq: Every day | ORAL | Status: DC
  Administered 2024-05-20: 81 mg via ORAL
  Filled 2024-05-20: qty 1

## 2024-05-20 MED ORDER — SODIUM CHLORIDE 0.9 % IV SOLN
2.0000 g | INTRAVENOUS | Status: DC
  Administered 2024-05-20: 2 g via INTRAVENOUS
  Filled 2024-05-20: qty 20

## 2024-05-20 MED ORDER — SERTRALINE HCL 50 MG PO TABS
50.0000 mg | ORAL_TABLET | Freq: Every day | ORAL | Status: DC
  Administered 2024-05-20: 50 mg via ORAL
  Filled 2024-05-20: qty 1

## 2024-05-20 NOTE — ED Notes (Signed)
I gave patient her lunch tray 

## 2024-05-20 NOTE — ED Notes (Signed)
 Breakfast tray was given.

## 2024-05-20 NOTE — Care Management CC44 (Signed)
 Condition Code 44 Documentation Completed  Patient Details  Name: Cindy Rhodes MRN: [id number] Date of Birth: 1946-03-26   Condition Code 44 given:  Yes Patient signature on Condition Code 44 notice:  Yes Documentation of 2 MD's agreement:  Yes Code 44 added to claim:  Yes    Karna Faster, LCSW 05/20/2024, 4:24 PM

## 2024-05-20 NOTE — Discharge Summary (Signed)
 Physician Discharge Summary  Cindy Rhodes FMW:[id number] DOB: 1946/07/20 DOA: 05/19/2024  PCP: Rexanne Ingle, MD  Admit date: 05/19/2024 Discharge date: 05/20/2024 30 Day Unplanned Readmission Risk Score    Flowsheet Row ED to Hosp-Admission (Current) from 05/19/2024 in [facility] Emergency Department at [facility]  30 Day Unplanned Readmission Risk Score (%) 16.9 Filed at 05/20/2024 1200    This score is the patient's risk of an unplanned readmission within 30 days of being discharged (0 -100%). The score is based on dignosis, age, lab data, medications, orders, and past utilization.   Low:  0-14.9   Medium: 15-21.9   High: 22-29.9   Extreme: 30 and above          Admitted From: Home Disposition: Home  Recommendations for Outpatient Follow-up:  Follow up with PCP in 1-2 weeks Please obtain BMP/CBC in one week Please follow up with your PCP on the following pending results: Unresulted Labs (From admission, onward)     Start     Ordered   05/21/24 0500  Basic metabolic panel  Tomorrow morning,   R        05/20/24 0730   05/20/24 0731  Urine Culture (for pregnant, neutropenic or urologic patients or patients with an indwelling urinary catheter)  (Urine Labs)  Once,   R       Question:  Indication  Answer:  Urgency/frequency   05/20/24 0731   05/20/24 0120  Urinalysis, Routine w reflex microscopic -Urine, Clean Catch  Once,   URGENT       Question:  Specimen Source  Answer:  Urine, Clean Catch   05/20/24 0119              Home Health: None Equipment/Devices: None  Discharge Condition: Stable CODE STATUS: Full code Diet recommendation: Cardiac  Due to brief hospitalization, I have copied admitting hospitalist HPI and ED course as below. HPI: Cindy Rhodes is a 78 y.o. female with medical history significant of  Hyponatremia, hypertension, recurrent urinary tract infection , CLBP,CVA,DMII, Migraine, HLD, who present to ED with complaint of generalized  weakness,urinary frequency as well as nausea.  Patient notes that she was recently diagnosed with UTI  and placed on antibiotic by her urologist. However, she notes she is very fatigued and generally weak which his affecting her ADLS. Due to this and associated nausea she  presented to ED.   ED Course:  IN ED  Vitals: Afeb, bp 180/105, HR 85, rr 16, sat 100% on ra  Wbc 9.3, hgb 11.7 at baseline,  plt 263,  Na 125-repeat 129,  bicarb 19, glu 121, cr 0.86 UA-neg Mag 1.6 Phos 3 EKG:nsr  Serum osmo 274 Tx zofran , lactic ILR. CTX  Subjective: Seen and examined the ED, husband at the bedside.  Patient said that  I am feeling much better she had no complaints.  We discussed plan of discharge after replenishing potassium and she was in full agreement.  Brief/Interim Summary: Details of the brief hospitalization as below.  Acute on chronic hypovolemic hyponatremia: Upon extensive chart review, it appears that patient does have chronic intermittent hyponatremia with sodium ranging anywhere from 125-130.  She came in with 125.  Received IV fluids/normal saline in the ED.  Sodium improved to 134.  No more fluids at the moment.  Monitor closely.   Hypomagnesemia: Replenished.  Will recheck.   Possible UTI: Patient was on antibiotics PTA.  UA unremarkable here which could be false negative.  She was given  Rocephin  here.  Resuming PTA antibiotics.   Hypertensive urgency: Presented with systolic blood pressure of 204/97.  Improved with resumption of PTA medications however blood pressure still elevated above the goal and per family, it is often elevated so I have decided to discharge her on increased dose of amlodipine  to 10 mg but resuming all other PTA antihypertensives.   CVA: Asymptomatic. Continue aspirin .   DMII: Hemoglobin A1c 6.2.  On metformin  PTA.  Hold that.  Blood sugar very well-controlled, does not need SSI.    HLD: Continue Crestor .  Discharge plan was discussed with patient and/or  family member and they verbalized understanding and agreed with it.  Discharge Diagnoses:  Principal Problem:   Hyponatremia Active Problems:   Hypokalemia   Hypertensive urgency    Discharge Instructions   Allergies as of 05/20/2024       Reactions   Hydrochlorothiazide Other (See Comments)   Unknown    Meloxicam Other (See Comments)   Dizzy and nervous    Pork-derived Products Other (See Comments)   Religious    Sulfa Antibiotics Other (See Comments)   Shuts down urinary system        Medication List     STOP taking these medications    clopidogrel  75 MG tablet Commonly known as: Plavix        TAKE these medications    amLODipine  10 MG tablet Commonly known as: NORVASC  Take 1 tablet (10 mg total) by mouth daily. What changed:  medication strength how much to take Another medication with the same name was removed. Continue taking this medication, and follow the directions you see here.   aspirin  EC 81 MG tablet Take 1 tablet (81 mg total) by mouth daily. Swallow whole.   Calcium  Carbonate-Vitamin D  600-400 MG-UNIT tablet Take 1 tablet by mouth daily.   CO Q 10 PO Take 1 capsule by mouth daily.   cyanocobalamin  1000 MCG tablet Take 1 tablet (1,000 mcg total) by mouth daily.   famotidine  20 MG tablet Commonly known as: PEPCID  Take 20 mg by mouth daily.   glucosamine-chondroitin 500-400 MG tablet Take 3 tablets by mouth daily.   memantine  10 MG tablet Commonly known as: NAMENDA  TAKE 1 TABLET BY MOUTH EVERY NIGHT FOR 2 WEEKS, THEN INCREASE TO TAKE 1 TABLET BY MOUTH TWO TIMES A DAY What changed:  how much to take how to take this when to take this additional instructions   metFORMIN  500 MG 24 hr tablet Commonly known as: GLUCOPHAGE -XR Take 1,000 mg by mouth 2 (two) times daily.   metoprolol  tartrate 25 MG tablet Commonly known as: LOPRESSOR  Take 25 mg by mouth 2 (two) times daily.   multivitamin with minerals Tabs tablet Take 1 tablet by  mouth daily.   oxybutynin  10 MG 24 hr tablet Commonly known as: DITROPAN -XL Take 10 mg by mouth daily.   potassium chloride  SA 20 MEQ tablet Commonly known as: KLOR-CON  M Take 2 tablets (40 mEq total) by mouth 2 (two) times daily for 2 days.   rosuvastatin  5 MG tablet Commonly known as: Crestor  Take 1 tablet (5 mg total) by mouth at bedtime.   sertraline  50 MG tablet Commonly known as: ZOLOFT  Take 50 mg by mouth daily.   spironolactone  25 MG tablet Commonly known as: ALDACTONE  Take 0.5 tablets (12.5 mg total) by mouth daily.   telmisartan  80 MG tablet Commonly known as: MICARDIS  Take 1 tablet (80 mg total) by mouth daily.   Vitamin C 500 MG Caps Take 500  mg by mouth daily.        Follow-up Information     Rexanne Ingle, MD Follow up in 1 week(s).   Specialty: Internal Medicine Contact information: [address] [place] [address] [phone]                Allergies  Allergen Reactions   Hydrochlorothiazide Other (See Comments)    Unknown    Meloxicam Other (See Comments)    Dizzy and nervous    Pork-Derived Products Other (See Comments)    Religious    Sulfa Antibiotics Other (See Comments)    Shuts down urinary system     Consultations: None   Procedures/Studies: No results found.   Discharge Exam: Vitals:   05/20/24 1401 05/20/24 1600  BP: (!) 176/78 (!) 182/81  Pulse:  65  Resp:  15  Temp:    SpO2:  99%   Vitals:   05/20/24 1345 05/20/24 1400 05/20/24 1401 05/20/24 1600  BP: (!) 188/91 (!) 176/78 (!) 176/78 (!) 182/81  Pulse: 62 66  65  Resp: 15 12  15   Temp:      TempSrc:      SpO2: 100% 99%  99%    General: Pt is alert, awake, not in acute distress Cardiovascular: RRR, S1/S2 +, no rubs, no gallops Respiratory: CTA bilaterally, no wheezing, no rhonchi Abdominal: Soft, NT, ND, bowel sounds + Extremities: no edema, no cyanosis    The results of significant diagnostics from this hospitalization  (including imaging, microbiology, ancillary and laboratory) are listed below for reference.     Microbiology: No results found for this or any previous visit (from the past 240 hours).   Labs: BNP (last 3 results) Recent Labs    05/20/24 0153  BNP 42.1   Basic Metabolic Panel: Recent Labs  Lab 05/19/24 2200 05/20/24 0153 05/20/24 0527 05/20/24 1215 05/20/24 1551  NA 125* 129* 134* 127* 129*  K 3.3* 3.1* 2.6* 3.5 3.6  CL 91* 93* 103 91* 101  CO2 19* 20* 18* 24 20*  GLUCOSE 121* 108* 113* 140* 115*  BUN 15 14 12 13 12   CREATININE 0.86 0.91 0.64 0.96 0.84  CALCIUM  9.3 9.4 7.8* 9.2 7.7*  MG  --  1.6*  --  4.2* 2.7*  PHOS  --  3.0  --   --   --    Liver Function Tests: Recent Labs  Lab 05/19/24 2200 05/20/24 0527  AST 26 22  ALT 18 13  ALKPHOS 65 48  BILITOT 1.0 0.4  PROT 7.7 5.7*  ALBUMIN 4.4 3.2*   No results for input(s): LIPASE, AMYLASE in the last 168 hours. No results for input(s): AMMONIA in the last 168 hours. CBC: Recent Labs  Lab 05/19/24 2200 05/20/24 0527  WBC 9.3 9.9  HGB 11.7* 10.2*  HCT 35.6* 29.1*  MCV 92.2 84.3  PLT 263 236   Cardiac Enzymes: No results for input(s): CKTOTAL, CKMB, CKMBINDEX, TROPONINI in the last 168 hours. BNP: Invalid input(s): POCBNP CBG: Recent Labs  Lab 05/20/24 0802 05/20/24 1216  GLUCAP 153* 126*   D-Dimer No results for input(s): DDIMER in the last 72 hours. Hgb A1c No results for input(s): HGBA1C in the last 72 hours. Lipid Profile No results for input(s): CHOL, HDL, LDLCALC, TRIG, CHOLHDL, LDLDIRECT in the last 72 hours. Thyroid  function studies Recent Labs    05/20/24 0153  TSH 2.200   Anemia work up No results for input(s): VITAMINB12, FOLATE, FERRITIN, TIBC, IRON,  RETICCTPCT in the last 72 hours. Urinalysis    Component Value Date/Time   COLORURINE COLORLESS (A) 05/20/2024 0108   APPEARANCEUR CLEAR 05/20/2024 0108   LABSPEC 1.002 (L) 05/20/2024  0108   PHURINE 8.0 05/20/2024 0108   GLUCOSEU NEGATIVE 05/20/2024 0108   HGBUR NEGATIVE 05/20/2024 0108   BILIRUBINUR NEGATIVE 05/20/2024 0108   KETONESUR NEGATIVE 05/20/2024 0108   PROTEINUR NEGATIVE 05/20/2024 0108   UROBILINOGEN 0.2 04/02/2014 1222   NITRITE NEGATIVE 05/20/2024 0108   LEUKOCYTESUR SMALL (A) 05/20/2024 0108   Sepsis Labs Recent Labs  Lab 05/19/24 2200 05/20/24 0527  WBC 9.3 9.9   Microbiology No results found for this or any previous visit (from the past 240 hours).  FURTHER DISCHARGE INSTRUCTIONS:   Get Medicines reviewed and adjusted: Please take all your medications with you for your next visit with your Primary MD   Laboratory/radiological data: Please request your Primary MD to go over all hospital tests and procedure/radiological results at the follow up, please ask your Primary MD to get all Hospital records sent to his/her office.   In some cases, they will be blood work, cultures and biopsy results pending at the time of your discharge. Please request that your primary care M.D. goes through all the records of your hospital data and follows up on these results.   Also Note the following: If you experience worsening of your admission symptoms, develop shortness of breath, life threatening emergency, suicidal or homicidal thoughts you must seek medical attention immediately by calling 911 or calling your MD immediately  if symptoms less severe.   You must read complete instructions/literature along with all the possible adverse reactions/side effects for all the Medicines you take and that have been prescribed to you. Take any new Medicines after you have completely understood and accpet all the possible adverse reactions/side effects.    patient was instructed, not to drive, operate heavy machinery, perform activities at heights, swimming or participation in water activities or provide baby-sitting services while on Pain, Sleep and Anxiety Medications;  until their outpatient Physician has advised to do so again. Also recommended to not to take more than prescribed Pain, Sleep and Anxiety Medications.  It is not advisable to combine anxiety, sleep and pain medications without talking with your primary care provider.     Wear Seat belts while driving.   Please note: You were cared for by a hospitalist during your hospital stay. Once you are discharged, your primary care physician will handle any further medical issues. Please note that NO REFILLS for any discharge medications will be authorized once you are discharged, as it is imperative that you return to your primary care physician (or establish a relationship with a primary care physician if you do not have one) for your post hospital discharge needs so that they can reassess your need for medications and monitor your lab values  Time coordinating discharge: Over 30 minutes  SIGNED:   Fredia Skeeter, MD  [facility] 05/20/2024, 5:06 PM *Please note that this is a verbal dictation therefore any spelling or grammatical errors are due to the Dragon Medical One system interpretation. If 7PM-7AM, please contact night-coverage [email or web address]

## 2024-05-20 NOTE — ED Notes (Signed)
 Pt ambulated to the restroom w/assistance from her husband. No complaints noted and no difficulty reported.

## 2024-05-20 NOTE — Care Management Obs Status (Signed)
 MEDICARE OBSERVATION STATUS NOTIFICATION   Patient Details  Name: Cindy Rhodes MRN: [id number] Date of Birth: [birth year]   Medicare Observation Status Notification Given:  Yes    Karna Faster, LCSW 05/20/2024, 4:35 PM

## 2024-05-20 NOTE — H&P (Addendum)
 History and Physical    Cindy Rhodes FMW:[id number] DOB: 09-28-1946 DOA: 05/19/2024  PCP: Rexanne Ingle, MD  Patient coming from: home  I have personally briefly reviewed patient's old medical records in [facility]  Chief Complaint: generalized weakness, urinary frequency   HPI: Cindy Rhodes is a 78 y.o. female with medical history significant of  Hyponatremia, hypertension, recurrent urinary tract infection , CLBP,CVA,DMII, Migraine, HLD, who present to ED with complaint of generalized weakness,urinary frequency as well as nausea.  Patient notes that she was recently diagnosed with UTI  and placed on antibiotic by her urologist. However, she notes she is very fatigued and generally weak which his affecting her ADLS. Due to this and associated nausea she  presented to ED.  ED Course:  IN ED  Vitals: Afeb, bp 180/105, HR 85, rr 16, sat 100% on ra  Wbc 9.3, hgb 11.7 at baseline,  plt 263,  Na 125-repeat 129,  bicarb 19, glu 121, cr 0.86 UA-neg Mag 1.6 Phos 3 EKG:nsr  Serum osmo 274 Tx zofran , lactic ILR. CTX Review of Systems: As per HPI otherwise 10 point review of systems negative.   Past Medical History:  Diagnosis Date   Acute lower UTI 08/12/2017   Acute metabolic encephalopathy 03/18/2023   Atrophy of vagina 05/14/2021   Cerebrovascular disease    moderate to severe per MRI   DDD (degenerative disc disease), cervical 10/13/2019   Decreased estrogen level 05/14/2021   Diabetic renal disease 05/14/2021   Essential hypertension 08/12/2017   Generalized anxiety disorder 05/14/2021   Hypertensive crisis 03/18/2023   Hypokalemia 08/12/2017   Hypomagnesemia 03/18/2023   Hyponatremia 08/12/2017   Interstitial cystitis 05/14/2021   Leukocytosis 05/14/2021   Major depressive disorder in remission 05/14/2021   Migraine without aura, not refractory 05/14/2021   Mild cognitive impairment of uncertain or unknown etiology 05/27/2023   Neck pain 08/28/2019   Osteopenia  05/14/2021   Pain in right hand 05/08/2019   Pincer nail deformity 01/24/2020   Prolonged QT interval 03/18/2023   Pure hypercholesterolemia 05/14/2021   Spinal stenosis in cervical region 10/13/2019   Subacute vulvitis 05/14/2021   Trigger finger of right hand 05/08/2019   Type 2 diabetes mellitus with hyperglycemia 08/12/2017   Urge incontinence of urine 05/14/2021    Past Surgical History:  Procedure Laterality Date   ABDOMINAL HYSTERECTOMY     HAND SURGERY Left      reports that she has quit smoking. She has never used smokeless tobacco. She reports that she does not drink alcohol and does not use drugs.  Allergies  Allergen Reactions   Hydrochlorothiazide Other (See Comments)    Unknown    Meloxicam Other (See Comments)    Dizzy and nervous    Pork-Derived Products Other (See Comments)    Religious    Sulfa Antibiotics Other (See Comments)    Shuts down urinary system     Family History  Problem Relation Age of Onset   Hypertension Other     Prior to Admission medications   Medication Sig Start Date End Date Taking? Authorizing Provider  amLODipine  (NORVASC ) 5 MG tablet Take 1 tablet (5 mg total) by mouth daily. 03/28/23 01/11/24  Drusilla Sabas RAMAN, MD  Ascorbic Acid (VITAMIN C) 500 MG CAPS Take 500 mg by mouth daily.    [provider]  aspirin  EC 81 MG tablet Take 1 tablet (81 mg total) by mouth daily. Swallow whole. 11/08/23   Jerri Pfeiffer, MD  Calcium  Carbonate-Vitamin  D 600-400 MG-UNIT tablet Take 1 tablet by mouth daily.    [provider]  clopidogrel  (PLAVIX ) 75 MG tablet Take 1 tablet (75 mg total) by mouth daily. 11/07/23   Jerri Pfeiffer, MD  Coenzyme Q10 (CO Q 10 PO) Take 1 capsule by mouth daily.    [provider]  cyanocobalamin  1000 MCG tablet Take 1 tablet (1,000 mcg total) by mouth daily. 11/08/23   Jerri Pfeiffer, MD  famotidine  (PEPCID ) 20 MG tablet Take 20 mg by mouth daily.    [provider]  glucosamine-chondroitin  500-400 MG tablet Take 3 tablets by mouth daily.     [provider]  memantine  (NAMENDA ) 10 MG tablet TAKE 1 TABLET BY MOUTH EVERY NIGHT FOR 2 WEEKS, THEN INCREASE TO TAKE 1 TABLET BY MOUTH TWO TIMES A DAY 01/11/24   Dina, Sara E, PA-C  metFORMIN  (GLUCOPHAGE -XR) 500 MG 24 hr tablet Take 1,000 mg by mouth 2 (two) times daily. 07/01/19   [provider]  metoprolol  tartrate (LOPRESSOR ) 25 MG tablet Take 25 mg by mouth 2 (two) times daily.  09/30/13   [provider]  Misc Natural Products (TART CHERRY ADVANCED) CAPS Take 1 capsule by mouth daily.    [provider]  Multiple Vitamin (MULTIVITAMIN WITH MINERALS) TABS tablet Take 1 tablet by mouth daily.    [provider]  oxybutynin  (DITROPAN -XL) 10 MG 24 hr tablet Take 10 mg by mouth daily.  12/28/18   [provider]  potassium chloride  SA (KLOR-CON  M) 20 MEQ tablet Take 2 tablets (40 mEq total) by mouth 2 (two) times daily for 2 days. 11/07/23 11/09/23  Jerri Pfeiffer, MD  rosuvastatin  (CRESTOR ) 5 MG tablet Take 1 tablet (5 mg total) by mouth at bedtime. 11/07/23   Jerri Pfeiffer, MD  sertraline  (ZOLOFT ) 50 MG tablet Take 50 mg by mouth daily. 07/22/13   [provider]  spironolactone  (ALDACTONE ) 25 MG tablet Take 0.5 tablets (12.5 mg total) by mouth daily. 03/29/23   Drusilla Sabas RAMAN, MD  telmisartan  (MICARDIS ) 80 MG tablet Take 1 tablet (80 mg total) by mouth daily. 08/18/19   Lorriane Holmes, MD  telmisartan -hydrochlorothiazide (MICARDIS  HCT) 80-25 MG tablet Take 1 tablet by mouth daily.  07/05/18 08/18/19  [provider]    Physical Exam: Vitals:   05/19/24 2143 05/19/24 2144 05/19/24 2335 05/20/24 0030  BP:   (!) 196/114 (!) 194/98  Pulse: 84 89 88 75  Resp:   16 16  Temp:   98 F (36.7 C)   SpO2: 100% 100% 100% 100%    Constitutional: NAD, calm, comfortable Vitals:   05/19/24 2143 05/19/24 2144 05/19/24 2335 05/20/24 0030  BP:   (!) 196/114 (!) 194/98  Pulse: 84 89 88 75   Resp:   16 16  Temp:   98 F (36.7 C)   SpO2: 100% 100% 100% 100%   Eyes: PERRL, lids and conjunctivae normal ENMT: Mucous membranes are moist. Posterior pharynx clear of any exudate or lesions.Normal dentition.  Neck: normal, supple, no masses, no thyromegaly Respiratory: clear to auscultation bilaterally, no wheezing, no crackles. Normal respiratory effort. No accessory muscle use.  Cardiovascular: Regular rate and rhythm, no murmurs / rubs / gallops. No extremity edema. 2+ pedal pulses. Abdomen: no tenderness, no masses palpated. No hepatosplenomegaly. Bowel sounds positive.  Musculoskeletal: no clubbing / cyanosis. No joint deformity upper and lower extremities. Good ROM, no contractures. Normal muscle tone.  Skin: no rashes, lesions, ulcers. No induration Neurologic: CN 2-12 grossly  intact. Sensation intact,. Strength 5/5 in all 4.  Psychiatric: Normal judgment and insight. Alert and oriented x 3. Normal mood.    Labs on Admission: I have personally reviewed following labs and imaging studies  CBC: Recent Labs  Lab 05/19/24 2200  WBC 9.3  HGB 11.7*  HCT 35.6*  MCV 92.2  PLT 263   Basic Metabolic Panel: Recent Labs  Lab 05/19/24 2200  NA 125*  K 3.3*  CL 91*  CO2 19*  GLUCOSE 121*  BUN 15  CREATININE 0.86  CALCIUM  9.3   GFR: CrCl cannot be calculated (Unknown ideal weight.). Liver Function Tests: Recent Labs  Lab 05/19/24 2200  AST 26  ALT 18  ALKPHOS 65  BILITOT 1.0  PROT 7.7  ALBUMIN 4.4   No results for input(s): LIPASE, AMYLASE in the last 168 hours. No results for input(s): AMMONIA in the last 168 hours. Coagulation Profile: No results for input(s): INR, PROTIME in the last 168 hours. Cardiac Enzymes: No results for input(s): CKTOTAL, CKMB, CKMBINDEX, TROPONINI in the last 168 hours. BNP (last 3 results) No results for input(s): PROBNP in the last 8760 hours. HbA1C: No results for input(s): HGBA1C in the last 72  hours. CBG: No results for input(s): GLUCAP in the last 168 hours. Lipid Profile: No results for input(s): CHOL, HDL, LDLCALC, TRIG, CHOLHDL, LDLDIRECT in the last 72 hours. Thyroid  Function Tests: No results for input(s): TSH, T4TOTAL, FREET4, T3FREE, THYROIDAB in the last 72 hours. Anemia Panel: No results for input(s): VITAMINB12, FOLATE, FERRITIN, TIBC, IRON, RETICCTPCT in the last 72 hours. Urine analysis:    Component Value Date/Time   COLORURINE YELLOW 11/04/2023 2149   APPEARANCEUR CLEAR 11/04/2023 2149   LABSPEC 1.024 11/04/2023 2149   PHURINE 6.0 11/04/2023 2149   GLUCOSEU 50 (A) 11/04/2023 2149   HGBUR NEGATIVE 11/04/2023 2149   BILIRUBINUR NEGATIVE 11/04/2023 2149   KETONESUR NEGATIVE 11/04/2023 2149   PROTEINUR 30 (A) 11/04/2023 2149   UROBILINOGEN 0.2 04/02/2014 1222   NITRITE NEGATIVE 11/04/2023 2149   LEUKOCYTESUR NEGATIVE 11/04/2023 2149    Radiological Exams on Admission: No results found.  EKG: Independently reviewed. See above  Assessment/Plan  Presumed Hypovolemic Hyponatremia -s/p ivf in ED NA improved to 129 from 125 base around 135 -continue with ivfs   Hypomagnesemia -replete  -mag 1.6  -possible also playing a role in presenting symptoms  Possible UTI -patient with urinary frequency -UA-negative  -however was on abx  -continue with CTX and await cultures     Hypertension ,uncontrolled -has missed medication  -will resume home regimen  -prn hydralazine    CLBP -no active issue   CVA -resume secondary ppx as able  DMII -monitor poc  -hold metformin  while in house -last fs 108 -last A1c6.2   Migraine -no active issue -supportive care    HLD -resume home regimen once med rec completed   DVT prophylaxis: heparin Code Status: full/ as discussed per patient wishes in event of cardiac arrest  Family Communication: Husband at bedside Disposition Plan: patient  expected to be admitted  greater than 2 midnights  Consults called: n/a Admission status: progressive   Camila DELENA Ned MD [facility]  If 7PM-7AM, please contact night-coverage [email or web address] Password [email or web address]  05/20/2024, 1:12 AM

## 2024-05-20 NOTE — Evaluation (Signed)
 Occupational Therapy Evaluation Patient Details Name: Cindy Rhodes MRN: [id number] DOB: [birth year] Today's Date: 05/20/2024   History of Present Illness   Patient is a 78 year old female who presented on 6/20 with weakness. Patient was admitted with hypovolemic hyponatremia, hypomagnesemia, possible UTI, HTN. PMH: CVA, DM II, HTN.     Clinical Impressions Patient is a 78 year old female who was admitted for above. [facility] with her husband with cane use for ADLs sometimes. Patient has supervision from husband for all ADLs. Currently, patients was noted to have changes in BP as noted below with no changes in status. MD and nurse made aware via secure chat. Patient was CGA for functional mobility with CGA needed when turning head during movement and turning corners to maintain balance. Patient was educated on using RW at home. Patient and husband verbalized understanding. Patient would continue to benefit from skilled OT services at this time while admitted and after d/c to address noted deficits in order to improve overall safety and independence in ADLs. Plan is for patient to d/c home with [facility] services and husband support.   Blood pressures Supine 174/74 mmhg Standing 145/87 mmhg After activity 195/87 mmhg      If plan is discharge home, recommend the following:   A little help with walking and/or transfers;A little help with bathing/dressing/bathroom;Assistance with cooking/housework;Direct supervision/assist for medications management;Assist for transportation;Help with stairs or ramp for entrance;Direct supervision/assist for financial management     Functional Status Assessment   Patient has had a recent decline in their functional status and demonstrates the ability to make significant improvements in function in a reasonable and predictable amount of time.     Equipment Recommendations   Other (comment) (RW)      Precautions/Restrictions    Precautions Precautions: Fall Restrictions Weight Bearing Restrictions Per Provider Order: No     Mobility Bed Mobility Overal bed mobility: Needs Assistance Bed Mobility: Supine to Sit, Sit to Supine     Supine to sit: Supervision Sit to supine: Supervision          Balance Overall balance assessment: Mild deficits observed, not formally tested             ADL either performed or assessed with clinical judgement   ADL Overall ADL's : Needs assistance/impaired Eating/Feeding: Supervision/ safety;Sitting   Grooming: Sitting;Supervision/safety   Upper Body Bathing: Sitting;Supervision/ safety   Lower Body Bathing: Sitting/lateral leans;Minimal assistance   Upper Body Dressing : Sitting;Supervision/safety   Lower Body Dressing: Sitting/lateral leans;Minimal assistance   Toilet Transfer: Contact guard Marine scientist Details (indicate cue type and reason): with no AD. patient noted to be unsteady with turning head to look around ED and during turns. patient able to maintain balance with CGA. patients husband reported that she has been unsteady since this medical episode and some prior.educated both on using RW at home. husband reported she normally uses cane and holds onto him. Toileting- Clothing Manipulation and Hygiene: Sit to/from stand;Contact guard assist               Vision Patient Visual Report: No change from baseline Vision Assessment?: No apparent visual deficits Additional Comments: patient reported no changes in vision during session            Pertinent Vitals/Pain Pain Assessment Pain Assessment: No/denies pain     Extremity/Trunk Assessment Upper Extremity Assessment Upper Extremity Assessment: Overall WFL for tasks assessed   Lower Extremity Assessment Lower Extremity Assessment:  Defer to PT evaluation   Cervical / Trunk Assessment Cervical / Trunk Assessment: Kyphotic      Cognition Arousal: Alert Behavior  During Therapy: WFL for tasks assessed/performed Cognition: No apparent impairments             OT - Cognition Comments: patients husband was present during session                 Following commands: Intact                  Home Living Family/patient expects to be discharged to:: Private residence Living Arrangements: Spouse/significant other Available Help at Discharge: Family;Available 24 hours/day Type of Home: Independent living facility Home Access: Elevator     Home Layout: One level     Bathroom Shower/Tub: Producer, television/film/video: Handicapped height     Home Equipment: Cane - single point   Additional Comments: staff provides cleaning, meals in dining room. no stairs to enter. Apartment on second floor, but has elevator. Walk in shower with grab bars, no seat. Handicapped height toilet.      Prior Functioning/Environment Prior Level of Function : Independent/Modified Independent             Mobility Comments: Has SPC, has had PT some ADLs Comments: husband drives. indep in medication management for both herself and her husband. Husband does the finances.    OT Problem List: Decreased activity tolerance;Impaired balance (sitting and/or standing);Decreased safety awareness;Decreased knowledge of precautions;Decreased knowledge of use of DME or AE   OT Treatment/Interventions: Self-care/ADL training;Therapeutic exercise;Therapeutic activities;Patient/family education;Balance training      OT Goals(Current goals can be found in the care plan section)   Acute Rehab OT Goals Patient Stated Goal: to get home OT Goal Formulation: With patient/family Time For Goal Achievement: 06/03/24 Potential to Achieve Goals: Fair   OT Frequency:  Min 2X/week       AM-PAC OT 6 Clicks Daily Activity     Outcome Measure Help from another person eating meals?: None Help from another person taking care of personal grooming?: A Little Help  from another person toileting, which includes using toliet, bedpan, or urinal?: A Little Help from another person bathing (including washing, rinsing, drying)?: A Little Help from another person to put on and taking off regular upper body clothing?: None Help from another person to put on and taking off regular lower body clothing?: A Little 6 Click Score: 20   End of Session Equipment Utilized During Treatment: Gait belt Nurse Communication: Other (comment) (messaged nurse and MD about patient vitals during session)  Activity Tolerance: Patient tolerated treatment well Patient left: in bed;with call bell/phone within reach;with family/visitor present (in ED)  OT Visit Diagnosis: Unsteadiness on feet (R26.81);Other abnormalities of gait and mobility (R26.89);Muscle weakness (generalized) (M62.81)                Time: 8478-8458 OT Time Calculation (min): 20 min Charges:  OT General Charges $OT Visit: 1 Visit OT Evaluation $OT Eval Low Complexity: 1 Low  Tianah Lonardo OTR/L, MS Acute Rehabilitation Department Office# [phone]   Geofm CHRISTELLA Dance 05/20/2024, 4:00 PM

## 2024-05-20 NOTE — ED Notes (Addendum)
 CRITICAL VALUE STICKER  CRITICAL VALUE:Potassium 2.6  RECEIVER (on-site recipient of call):I. Chelcie Estorga  DATE & TIME NOTIFIED: 05/20/24 0732  MESSENGER (representative from lab):Lab  MD NOTIFIED: Pahwani  TIME OF NOTIFICATION:0732  RESPONSE:

## 2024-05-21 LAB — URINE CULTURE: Culture: NO GROWTH

## 2024-05-23 ENCOUNTER — Other Ambulatory Visit: Payer: Self-pay

## 2024-05-23 ENCOUNTER — Observation Stay (HOSPITAL_COMMUNITY)
Admission: EM | Admit: 2024-05-23 | Discharge: 2024-05-24 | Disposition: A | Discharge status: Home / Self Care | Attending: Internal Medicine | Admitting: Internal Medicine

## 2024-05-23 DIAGNOSIS — E1165 Type 2 diabetes mellitus with hyperglycemia: Secondary | ICD-10-CM | POA: Diagnosis not present

## 2024-05-23 DIAGNOSIS — E871 Hypo-osmolality and hyponatremia: Principal | ICD-10-CM | POA: Insufficient documentation

## 2024-05-23 DIAGNOSIS — Z7982 Long term (current) use of aspirin: Secondary | ICD-10-CM | POA: Insufficient documentation

## 2024-05-23 DIAGNOSIS — Z79899 Other long term (current) drug therapy: Secondary | ICD-10-CM | POA: Insufficient documentation

## 2024-05-23 DIAGNOSIS — G3184 Mild cognitive impairment, so stated: Secondary | ICD-10-CM | POA: Diagnosis not present

## 2024-05-23 DIAGNOSIS — F3342 Major depressive disorder, recurrent, in full remission: Secondary | ICD-10-CM | POA: Diagnosis not present

## 2024-05-23 DIAGNOSIS — F411 Generalized anxiety disorder: Secondary | ICD-10-CM | POA: Diagnosis not present

## 2024-05-23 DIAGNOSIS — I10 Essential (primary) hypertension: Secondary | ICD-10-CM | POA: Insufficient documentation

## 2024-05-23 DIAGNOSIS — Z7984 Long term (current) use of oral hypoglycemic drugs: Secondary | ICD-10-CM | POA: Diagnosis not present

## 2024-05-23 DIAGNOSIS — I999 Unspecified disorder of circulatory system: Secondary | ICD-10-CM | POA: Diagnosis present

## 2024-05-23 DIAGNOSIS — F325 Major depressive disorder, single episode, in full remission: Secondary | ICD-10-CM | POA: Diagnosis present

## 2024-05-23 DIAGNOSIS — R531 Weakness: Secondary | ICD-10-CM | POA: Diagnosis present

## 2024-05-23 LAB — CBC
HCT: 32.5 % — ABNORMAL LOW (ref 36.0–46.0)
Hemoglobin: 11.4 g/dL — ABNORMAL LOW (ref 12.0–15.0)
MCH: 30.2 pg (ref 26.0–34.0)
MCHC: 35.1 g/dL (ref 30.0–36.0)
MCV: 86 fL (ref 80.0–100.0)
Platelets: 254 10*3/uL (ref 150–400)
RBC: 3.78 MIL/uL — ABNORMAL LOW (ref 3.87–5.11)
RDW: 13.6 % (ref 11.5–15.5)
WBC: 10.6 10*3/uL — ABNORMAL HIGH (ref 4.0–10.5)
nRBC: 0 % (ref 0.0–0.2)

## 2024-05-23 LAB — BASIC METABOLIC PANEL WITH GFR
Anion gap: 12 (ref 5–15)
BUN: 21 mg/dL (ref 8–23)
CO2: 21 mmol/L — ABNORMAL LOW (ref 22–32)
Calcium: 9.2 mg/dL (ref 8.9–10.3)
Chloride: 90 mmol/L — ABNORMAL LOW (ref 98–111)
Creatinine, Ser: 0.97 mg/dL (ref 0.44–1.00)
GFR, Estimated: >60 mL/min (ref >60)
Glucose, Bld: 119 mg/dL — ABNORMAL HIGH (ref 70–99)
Potassium: 3.8 mmol/L (ref 3.5–5.1)
Sodium: 123 mmol/L — ABNORMAL LOW (ref 135–145)

## 2024-05-23 LAB — URINALYSIS, ROUTINE W REFLEX MICROSCOPIC
Bilirubin Urine: NEGATIVE
Glucose, UA: NEGATIVE mg/dL
Hgb urine dipstick: NEGATIVE
Ketones, ur: NEGATIVE mg/dL
Leukocytes,Ua: NEGATIVE
Nitrite: NEGATIVE
Protein, ur: NEGATIVE mg/dL
Specific Gravity, Urine: 1.003 — ABNORMAL LOW (ref 1.005–1.030)
pH: 7 (ref 5.0–8.0)

## 2024-05-23 NOTE — ED Triage Notes (Signed)
 Pt recently dx with a UTI, feels the abx she is taking aren't helping. Pt has generalized weakness, feels like she is going to jump out of my skin.

## 2024-05-24 ENCOUNTER — Other Ambulatory Visit (HOSPITAL_COMMUNITY): Payer: Self-pay

## 2024-05-24 ENCOUNTER — Encounter (HOSPITAL_COMMUNITY): Payer: Self-pay | Admitting: Family Medicine

## 2024-05-24 DIAGNOSIS — E871 Hypo-osmolality and hyponatremia: Secondary | ICD-10-CM

## 2024-05-24 DIAGNOSIS — I10 Essential (primary) hypertension: Secondary | ICD-10-CM

## 2024-05-24 DIAGNOSIS — F325 Major depressive disorder, single episode, in full remission: Secondary | ICD-10-CM | POA: Diagnosis not present

## 2024-05-24 DIAGNOSIS — F411 Generalized anxiety disorder: Secondary | ICD-10-CM | POA: Diagnosis not present

## 2024-05-24 DIAGNOSIS — G3184 Mild cognitive impairment, so stated: Secondary | ICD-10-CM

## 2024-05-24 DIAGNOSIS — E1165 Type 2 diabetes mellitus with hyperglycemia: Secondary | ICD-10-CM | POA: Diagnosis not present

## 2024-05-24 LAB — CBC
HCT: 32.8 % — ABNORMAL LOW (ref 36.0–46.0)
Hemoglobin: 11.3 g/dL — ABNORMAL LOW (ref 12.0–15.0)
MCH: 30.5 pg (ref 26.0–34.0)
MCHC: 34.5 g/dL (ref 30.0–36.0)
MCV: 88.4 fL (ref 80.0–100.0)
Platelets: 267 10*3/uL (ref 150–400)
RBC: 3.71 MIL/uL — ABNORMAL LOW (ref 3.87–5.11)
RDW: 13.7 % (ref 11.5–15.5)
WBC: 9.3 10*3/uL (ref 4.0–10.5)
nRBC: 0 % (ref 0.0–0.2)

## 2024-05-24 LAB — BASIC METABOLIC PANEL WITH GFR
Anion gap: 10 (ref 5–15)
BUN: 18 mg/dL (ref 8–23)
CO2: 21 mmol/L — ABNORMAL LOW (ref 22–32)
Calcium: 9.1 mg/dL (ref 8.9–10.3)
Chloride: 95 mmol/L — ABNORMAL LOW (ref 98–111)
Creatinine, Ser: 0.87 mg/dL (ref 0.44–1.00)
GFR, Estimated: >60 mL/min (ref >60)
Glucose, Bld: 89 mg/dL (ref 70–99)
Potassium: 4.2 mmol/L (ref 3.5–5.1)
Sodium: 126 mmol/L — ABNORMAL LOW (ref 135–145)

## 2024-05-24 LAB — GLUCOSE, CAPILLARY
Glucose-Capillary: 109 mg/dL — ABNORMAL HIGH (ref 70–99)
Glucose-Capillary: 117 mg/dL — ABNORMAL HIGH (ref 70–99)

## 2024-05-24 LAB — HEMOGLOBIN A1C
Hgb A1c MFr Bld: 5.4 % (ref 4.8–5.6)
Mean Plasma Glucose: 108.28 mg/dL

## 2024-05-24 MED ORDER — INSULIN ASPART 100 UNIT/ML IJ SOLN: 0.0000 [IU] | Freq: Every day | INTRAMUSCULAR | Status: DC

## 2024-05-24 MED ORDER — ACETAMINOPHEN 325 MG PO TABS: 650.0000 mg | ORAL_TABLET | Freq: Four times a day (QID) | ORAL | Status: DC | PRN

## 2024-05-24 MED ORDER — ROSUVASTATIN CALCIUM 5 MG PO TABS: 5.0000 mg | ORAL_TABLET | Freq: Every day | ORAL | Status: DC

## 2024-05-24 MED ORDER — HYDROCODONE-ACETAMINOPHEN 5-325 MG PO TABS: 1.0000 | ORAL_TABLET | Freq: Four times a day (QID) | ORAL | Status: DC | PRN

## 2024-05-24 MED ORDER — MEMANTINE HCL 10 MG PO TABS
10.0000 mg | ORAL_TABLET | Freq: Two times a day (BID) | ORAL | Status: DC
  Administered 2024-05-24: 10 mg via ORAL
  Filled 2024-05-24: qty 1

## 2024-05-24 MED ORDER — SPIRONOLACTONE 25 MG PO TABS
25.0000 mg | ORAL_TABLET | Freq: Every day | ORAL | 0 refills | Status: AC
  Filled 2024-05-24: qty 30, 30d supply, fill #0

## 2024-05-24 MED ORDER — OXYBUTYNIN CHLORIDE ER 5 MG PO TB24
10.0000 mg | ORAL_TABLET | Freq: Every day | ORAL | Status: DC
  Administered 2024-05-24: 10 mg via ORAL
  Filled 2024-05-24: qty 2

## 2024-05-24 MED ORDER — INSULIN ASPART 100 UNIT/ML IJ SOLN: 0.0000 [IU] | Freq: Three times a day (TID) | INTRAMUSCULAR | Status: DC

## 2024-05-24 MED ORDER — SODIUM CHLORIDE 0.9 % IV BOLUS
1000.0000 mL | Freq: Once | INTRAVENOUS | Status: AC
  Administered 2024-05-24: 1000 mL via INTRAVENOUS

## 2024-05-24 MED ORDER — SERTRALINE HCL 50 MG PO TABS
50.0000 mg | ORAL_TABLET | Freq: Every day | ORAL | Status: DC
  Administered 2024-05-24: 50 mg via ORAL
  Filled 2024-05-24: qty 1

## 2024-05-24 MED ORDER — IRBESARTAN 300 MG PO TABS
300.0000 mg | ORAL_TABLET | Freq: Every day | ORAL | Status: DC
  Administered 2024-05-24: 300 mg via ORAL
  Filled 2024-05-24: qty 1

## 2024-05-24 MED ORDER — ORAL CARE MOUTH RINSE: 15.0000 mL | OROMUCOSAL | Status: DC | PRN

## 2024-05-24 MED ORDER — AMLODIPINE BESYLATE 10 MG PO TABS
10.0000 mg | ORAL_TABLET | Freq: Every day | ORAL | Status: DC
  Administered 2024-05-24: 10 mg via ORAL
  Filled 2024-05-24: qty 1

## 2024-05-24 MED ORDER — ACETAMINOPHEN 650 MG RE SUPP: 650.0000 mg | Freq: Four times a day (QID) | RECTAL | Status: DC | PRN

## 2024-05-24 MED ORDER — METOPROLOL TARTRATE 25 MG PO TABS
25.0000 mg | ORAL_TABLET | Freq: Two times a day (BID) | ORAL | Status: DC
  Administered 2024-05-24: 25 mg via ORAL
  Filled 2024-05-24: qty 1

## 2024-05-24 MED ORDER — SODIUM CHLORIDE 0.9% FLUSH
3.0000 mL | Freq: Two times a day (BID) | INTRAVENOUS | Status: DC
  Administered 2024-05-24: 3 mL via INTRAVENOUS

## 2024-05-24 MED ORDER — POLYETHYLENE GLYCOL 3350 17 G PO PACK: 17.0000 g | PACK | Freq: Every day | ORAL | Status: DC | PRN

## 2024-05-24 MED ORDER — COSYNTROPIN 0.25 MG IJ SOLR: 0.2500 mg | Freq: Once | INTRAMUSCULAR | Status: DC

## 2024-05-24 NOTE — Discharge Summary (Signed)
 Physician Discharge Summary  [name] FMW:[id number] DOB: December 30, 1945 DOA: 05/23/2024  PCP: Rexanne Ingle, MD  Admit date: 05/23/2024 Discharge date: 05/24/2024  Admitted From: Home Disposition: Home  Recommendations for Outpatient Follow-up:  Follow up with PCP in 1-2 weeks Follow-up with nephrologist at [facility] later this week Recommend repeat BMP and urine studies after fluid restriction over the weekend  Home Health: None Equipment/Devices: None  Discharge Condition: Stable Home CODE STATUS: Stable Diet recommendation: Regular diet, fluid restricted to 1 L/day  Brief/Interim Summary: Cindy Rhodes is a 78 y.o. female with medical history significant for hypertension, hyperlipidemia, CVA, cognitive impairment, depression, anxiety, and recent admission for hyponatremia with presents with generalized weakness and malaise.  Patient has longstanding hyponatremia with multiple outpatient workup attempts, currently following with [facility].  It appears during their workup patient had borderline SIADH, per our workup here patient's labs(elevated urine sodium, decreased urine osmolality) are consistent with polydipsia versus potomania(less likely given patient's history).  At this time would recommend fluid restrictions at 1 L/day with repeat labs early next week with either primary team or nephrologist.  Patient also had reported abnormal cortisol testing at an outside facility, noted cortisol testing available per our records in April 2024 is within normal limits.  It is certainly feasible the patient has multiple issues complicating her studies given her various results over the past year with different outcomes. Sertraline  discontinued as well given its possible effect on sodium (although symptoms are more consistent with SIADH than polydipsia).  At this time patient is otherwise stable, sodium appears to be approaching baseline, potassium also now within normal limits.  She is without  further complaint or issue, lengthy discussion in regards to appropriate diet, medication as well as fluid intake was had at bedside with patient's husband as well as patient's son over the phone.  She is otherwise stable and agreeable for discharge home.    Discharge Diagnoses:  Principal Problem:   Hyponatremia Active Problems:   Essential hypertension   Type 2 diabetes mellitus with hyperglycemia   Generalized anxiety disorder   Major depressive disorder in remission   Mild cognitive impairment of uncertain or unknown etiology  Hyponatremia, chronic -Euvolemic with non-anion gap acidosis, serum osmolality borderline low at 274, urine osmolality depressed at 142, urine sodium elevated at 42 with urine potassium of 12.  UA is otherwise bland and unremarkable  Hypokalemia -increase spironolactone , repeat labs as above  Hypertension  - Continue amlodipine , metoprolol , and ARB     Depression, anxiety  - Discontinue sertraline  at discharge, follow-up testing pending   Type II DM, well-controlled Cognitive impairment - stable  Discharge Instructions  Discharge Instructions     Call MD for:  difficulty breathing, headache or visual disturbances   Complete by: As directed    Call MD for:  extreme fatigue   Complete by: As directed    Call MD for:  hives   Complete by: As directed    Call MD for:  persistant dizziness or light-headedness   Complete by: As directed    Call MD for:  persistant nausea and vomiting   Complete by: As directed    Call MD for:  severe uncontrolled pain   Complete by: As directed    Call MD for:  temperature >100.4   Complete by: As directed    Diet - low sodium heart healthy   Complete by: As directed    Restrict fluids to (1L) as discussed. (This is the black  water bottle you have in the room)   Discharge instructions   Complete by: As directed    Continue to limit fluid intake to 1L per day. This includes drinks, soups, or other liquids.    Increase activity slowly   Complete by: As directed       Allergies as of 05/24/2024       Reactions   Hydrochlorothiazide Other (See Comments)   Unknown    Macrolides And Ketolides Other (See Comments)    Unknown   Meloxicam Other (See Comments)   Dizzy and nervous    Pork-derived Products Other (See Comments)   Religious    Sulfa Antibiotics Other (See Comments)   Shuts down urinary system        Medication List     STOP taking these medications    sertraline  50 MG tablet Commonly known as: ZOLOFT        TAKE these medications    amLODipine  10 MG tablet Commonly known as: NORVASC  Take 1 tablet (10 mg total) by mouth daily. What changed: Another medication with the same name was removed. Continue taking this medication, and follow the directions you see here.   aspirin  EC 81 MG tablet Take 1 tablet (81 mg total) by mouth daily. Swallow whole.   Calcium  Carbonate-Vitamin D  600-400 MG-UNIT tablet Take 1 tablet by mouth daily.   CO Q 10 PO Take 1 capsule by mouth daily.   cyanocobalamin  1000 MCG tablet Take 1 tablet (1,000 mcg total) by mouth daily.   glucosamine-chondroitin 500-400 MG tablet Take 2 tablets by mouth daily.   memantine  10 MG tablet Commonly known as: NAMENDA  TAKE 1 TABLET BY MOUTH EVERY NIGHT FOR 2 WEEKS, THEN INCREASE TO TAKE 1 TABLET BY MOUTH TWO TIMES A DAY What changed:  how much to take how to take this when to take this additional instructions   metFORMIN  500 MG 24 hr tablet Commonly known as: GLUCOPHAGE -XR Take 1,000 mg by mouth 2 (two) times daily.   metoprolol  tartrate 25 MG tablet Commonly known as: LOPRESSOR  Take 25 mg by mouth 2 (two) times daily.   multivitamin with minerals Tabs tablet Take 1 tablet by mouth daily.   oxybutynin  10 MG 24 hr tablet Commonly known as: DITROPAN -XL Take 10 mg by mouth daily.   rosuvastatin  5 MG tablet Commonly known as: Crestor  Take 1 tablet (5 mg total) by mouth at  bedtime. What changed: when to take this   spironolactone  25 MG tablet Commonly known as: ALDACTONE  Take 1 tablet (25 mg total) by mouth daily. What changed: how much to take   telmisartan  80 MG tablet Commonly known as: MICARDIS  Take 1 tablet (80 mg total) by mouth daily.   Vitamin C 500 MG Caps Take 500 mg by mouth daily.        Allergies  Allergen Reactions   Hydrochlorothiazide Other (See Comments)    Unknown    Macrolides And Ketolides Other (See Comments)     Unknown   Meloxicam Other (See Comments)    Dizzy and nervous    Pork-Derived Products Other (See Comments)    Religious    Sulfa Antibiotics Other (See Comments)    Shuts down urinary system     Consultations: None  Procedures/Studies: No results found.   Subjective: No acute issues or events noted overnight  Discharge Exam: Vitals:   05/24/24 1147 05/24/24 1317  BP: (!) 149/69 (!) 150/70  Pulse: 67 (!) 56  Resp:  16  Temp:  98.4 F (36.9 C)  SpO2: 96% 96%   Vitals:   05/24/24 0220 05/24/24 0500 05/24/24 1147 05/24/24 1317  BP: (!) 187/84 (!) 145/75 (!) 149/69 (!) 150/70  Pulse: 60 61 67 (!) 56  Resp: 18 14  16   Temp: 98 F (36.7 C) 97.8 F (36.6 C)  98.4 F (36.9 C)  TempSrc:  Oral  Oral  SpO2: 100% 100% 96% 96%  Weight:  55 kg    Height:  5' (1.524 m)      General: Pt is alert, awake, not in acute distress Cardiovascular: RRR, S1/S2 +, no rubs, no gallops Respiratory: CTA bilaterally, no wheezing, no rhonchi Abdominal: Soft, NT, ND, bowel sounds + Extremities: no edema, no cyanosis    The results of significant diagnostics from this hospitalization (including imaging, microbiology, ancillary and laboratory) are listed below for reference.     Microbiology: Recent Results (from the past 240 hours)  Urine Culture (for pregnant, neutropenic or urologic patients or patients with an indwelling urinary catheter)     Status: None   Collection Time: 05/20/24  1:08 AM   Specimen:  Urine, Clean Catch  Result Value Ref Range Status   Specimen Description   Final    URINE, CLEAN CATCH Performed at [facility], [address] [place], [place] [address]    Special Requests   Final    NONE Performed at [facility], [address] [place], [place] [address]    Culture   Final    NO GROWTH Performed at [facility], [address] [place], [place] [address]    Report Status 05/21/2024 FINAL  Final     Labs: BNP (last 3 results) Recent Labs    05/20/24 0153  BNP 42.1   Basic Metabolic Panel: Recent Labs  Lab 05/20/24 0153 05/20/24 0527 05/20/24 1215 05/20/24 1551 05/23/24 2144 05/24/24 0541  NA 129* 134* 127* 129* 123* 126*  K 3.1* 2.6* 3.5 3.6 3.8 4.2  CL 93* 103 91* 101 90* 95*  CO2 20* 18* 24 20* 21* 21*  GLUCOSE 108* 113* 140* 115* 119* 89  BUN 14 12 13 12 21 18   CREATININE 0.91 0.64 0.96 0.84 0.97 0.87  CALCIUM  9.4 7.8* 9.2 7.7* 9.2 9.1  MG 1.6*  --  4.2* 2.7*  --   --   PHOS 3.0  --   --   --   --   --    Liver Function Tests: Recent Labs  Lab 05/19/24 2200 05/20/24 0527  AST 26 22  ALT 18 13  ALKPHOS 65 48  BILITOT 1.0 0.4  PROT 7.7 5.7*  ALBUMIN 4.4 3.2*   No results for input(s): LIPASE, AMYLASE in the last 168 hours. No results for input(s): AMMONIA in the last 168 hours. CBC: Recent Labs  Lab 05/19/24 2200 05/20/24 0527 05/23/24 2144 05/24/24 0541  WBC 9.3 9.9 10.6* 9.3  HGB 11.7* 10.2* 11.4* 11.3*  HCT 35.6* 29.1* 32.5* 32.8*  MCV 92.2 84.3 86.0 88.4  PLT 263 236 254 267   Cardiac Enzymes: No results for input(s): CKTOTAL, CKMB, CKMBINDEX, TROPONINI in the last 168 hours. BNP: Invalid input(s): POCBNP CBG: Recent Labs  Lab 05/20/24 0802 05/20/24 1216 05/24/24 0738 05/24/24 1134  GLUCAP 153* 126* 109* 117*   D-Dimer No results for input(s): DDIMER in the last 72 hours. Hgb A1c Recent Labs    05/24/24 0541  HGBA1C 5.4   Lipid  Profile No results for input(s):  CHOL, HDL, LDLCALC, TRIG, CHOLHDL, LDLDIRECT in the last 72 hours. Thyroid  function studies No results for input(s): TSH, T4TOTAL, T3FREE, THYROIDAB in the last 72 hours.  Invalid input(s): FREET3 Anemia work up No results for input(s): VITAMINB12, FOLATE, FERRITIN, TIBC, IRON, RETICCTPCT in the last 72 hours. Urinalysis    Component Value Date/Time   COLORURINE STRAW (A) 05/23/2024 2156   APPEARANCEUR CLEAR 05/23/2024 2156   LABSPEC 1.003 (L) 05/23/2024 2156   PHURINE 7.0 05/23/2024 2156   GLUCOSEU NEGATIVE 05/23/2024 2156   HGBUR NEGATIVE 05/23/2024 2156   BILIRUBINUR NEGATIVE 05/23/2024 2156   KETONESUR NEGATIVE 05/23/2024 2156   PROTEINUR NEGATIVE 05/23/2024 2156   UROBILINOGEN 0.2 04/02/2014 1222   NITRITE NEGATIVE 05/23/2024 2156   LEUKOCYTESUR NEGATIVE 05/23/2024 2156   Sepsis Labs Recent Labs  Lab 05/19/24 2200 05/20/24 0527 05/23/24 2144 05/24/24 0541  WBC 9.3 9.9 10.6* 9.3   Microbiology Recent Results (from the past 240 hours)  Urine Culture (for pregnant, neutropenic or urologic patients or patients with an indwelling urinary catheter)     Status: None   Collection Time: 05/20/24  1:08 AM   Specimen: Urine, Clean Catch  Result Value Ref Range Status   Specimen Description   Final    URINE, CLEAN CATCH Performed at [facility], [address] [place], [place] [address]    Special Requests   Final    NONE Performed at [facility], [address], [place], [place] [address]    Culture   Final    NO GROWTH Performed at [facility], [address] [place], [place] [address]    Report Status 05/21/2024 FINAL  Final     Time coordinating discharge: Over 30 minutes  SIGNED:   Elsie JAYSON Montclair, DO [facility] 05/24/2024, 3:29 PM Pager   If 7PM-7AM, please contact night-coverage [email or web address]

## 2024-05-24 NOTE — H&P (Signed)
 History and Physical    Cindy Rhodes FMW:[id number] DOB: [birth year] DOA: 05/23/2024  PCP: Rexanne Ingle, MD   Patient coming from: Home   Chief Complaint: General weakness, malaise   HPI: Cindy Rhodes is a 78 y.o. female with medical history significant for hypertension, hyperlipidemia, CVA, cognitive impairment, depression, anxiety, and recent admission for hyponatremia with presents with generalized weakness and malaise.   The patient reports increased generalized weakness and malaise since leaving the hospital. She has been eating and drinking as usual and denies nausea, vomiting, or diarrhea. She denies leg swelling.   ED Course: Upon arrival to the ED, patient is found to be afebrile and saturating well on room air with normal HR and stable BP. Labs are most notable for sodium 123.   She was given 1 liter of NS in the ED.   Review of Systems:  All other systems reviewed and apart from HPI, are negative.  Past Medical History:  Diagnosis Date   Acute lower UTI 08/12/2017   Acute metabolic encephalopathy 03/18/2023   Atrophy of vagina 05/14/2021   Cerebrovascular disease    moderate to severe per MRI   DDD (degenerative disc disease), cervical 10/13/2019   Decreased estrogen level 05/14/2021   Diabetic renal disease 05/14/2021   Essential hypertension 08/12/2017   Generalized anxiety disorder 05/14/2021   Hypertensive crisis 03/18/2023   Hypokalemia 08/12/2017   Hypomagnesemia 03/18/2023   Hyponatremia 08/12/2017   Interstitial cystitis 05/14/2021   Leukocytosis 05/14/2021   Major depressive disorder in remission 05/14/2021   Migraine without aura, not refractory 05/14/2021   Mild cognitive impairment of uncertain or unknown etiology 05/27/2023   Neck pain 08/28/2019   Osteopenia 05/14/2021   Pain in right hand 05/08/2019   Pincer nail deformity 01/24/2020   Prolonged QT interval 03/18/2023   Pure hypercholesterolemia 05/14/2021   Spinal stenosis in cervical  region 10/13/2019   Subacute vulvitis 05/14/2021   Trigger finger of right hand 05/08/2019   Type 2 diabetes mellitus with hyperglycemia 08/12/2017   Urge incontinence of urine 05/14/2021    Past Surgical History:  Procedure Laterality Date   ABDOMINAL HYSTERECTOMY     HAND SURGERY Left     Social History:   reports that she has quit smoking. She has never used smokeless tobacco. She reports that she does not drink alcohol and does not use drugs.  Allergies  Allergen Reactions   Hydrochlorothiazide Other (See Comments)    Unknown    Macrolides And Ketolides     Other Reaction(s): Unknown   Meloxicam Other (See Comments)    Dizzy and nervous    Pork-Derived Products Other (See Comments)    Religious    Sulfa Antibiotics Other (See Comments)    Shuts down urinary system     Family History  Problem Relation Age of Onset   Hypertension Other      Prior to Admission medications   Medication Sig Start Date End Date Taking? Authorizing Provider  amLODipine  (NORVASC ) 10 MG tablet Take 1 tablet (10 mg total) by mouth daily. 05/20/24 05/20/25  Pahwani, Ravi, MD  Ascorbic Acid (VITAMIN C) 500 MG CAPS Take 500 mg by mouth daily.    [provider]  aspirin  EC 81 MG tablet Take 1 tablet (81 mg total) by mouth daily. Swallow whole. 11/08/23   Jerri Pfeiffer, MD  Calcium  Carbonate-Vitamin D  600-400 MG-UNIT tablet Take 1 tablet by mouth daily.    [provider]  Coenzyme Q10 (CO Q 10 PO)  Take 1 capsule by mouth daily.    [provider]  cyanocobalamin  1000 MCG tablet Take 1 tablet (1,000 mcg total) by mouth daily. 11/08/23   Jerri Pfeiffer, MD  famotidine  (PEPCID ) 20 MG tablet Take 20 mg by mouth daily.    [provider]  glucosamine-chondroitin 500-400 MG tablet Take 3 tablets by mouth daily.     [provider]  memantine  (NAMENDA ) 10 MG tablet TAKE 1 TABLET BY MOUTH EVERY NIGHT FOR 2 WEEKS, THEN INCREASE TO TAKE 1 TABLET BY MOUTH TWO TIMES A  DAY Patient taking differently: Take 10 mg by mouth 2 (two) times daily. 01/11/24   Wertman, Sara E, PA-C  metFORMIN  (GLUCOPHAGE -XR) 500 MG 24 hr tablet Take 1,000 mg by mouth 2 (two) times daily. 07/01/19   [provider]  metoprolol  tartrate (LOPRESSOR ) 25 MG tablet Take 25 mg by mouth 2 (two) times daily.  09/30/13   [provider]  Multiple Vitamin (MULTIVITAMIN WITH MINERALS) TABS tablet Take 1 tablet by mouth daily.    [provider]  oxybutynin  (DITROPAN -XL) 10 MG 24 hr tablet Take 10 mg by mouth daily.  12/28/18   [provider]  potassium chloride  SA (KLOR-CON  M) 20 MEQ tablet Take 2 tablets (40 mEq total) by mouth 2 (two) times daily for 2 days. 11/07/23 11/09/23  Jerri Pfeiffer, MD  rosuvastatin  (CRESTOR ) 5 MG tablet Take 1 tablet (5 mg total) by mouth at bedtime. 11/07/23   Jerri Pfeiffer, MD  sertraline  (ZOLOFT ) 50 MG tablet Take 50 mg by mouth daily. 07/22/13   [provider]  spironolactone  (ALDACTONE ) 25 MG tablet Take 0.5 tablets (12.5 mg total) by mouth daily. 03/29/23   Drusilla Sabas RAMAN, MD  telmisartan  (MICARDIS ) 80 MG tablet Take 1 tablet (80 mg total) by mouth daily. 08/18/19   Lorriane Holmes, MD  telmisartan -hydrochlorothiazide (MICARDIS  HCT) 80-25 MG tablet Take 1 tablet by mouth daily.  07/05/18 08/18/19  [provider]    Physical Exam: Vitals:   05/23/24 2128 05/24/24 0220  BP: (!) 181/116 (!) 187/84  Pulse: 73 60  Resp: 17 18  Temp: 97.8 F (36.6 C) 98 F (36.7 C)  SpO2: 99% 100%    Constitutional: NAD, no pallor or diaphoresis   Eyes: PERTLA, lids and conjunctivae normal ENMT: Mucous membranes are moist. Posterior pharynx clear of any exudate or lesions.   Neck: supple, no masses  Respiratory: no wheezing, no crackles. No accessory muscle use.  Cardiovascular: S1 & S2 heard, regular rate and rhythm. No extremity edema.  Abdomen: No tenderness, soft. Bowel sounds active.  Musculoskeletal: no clubbing / cyanosis. No  joint deformity upper and lower extremities.   Skin: no significant rashes, lesions, ulcers. Warm, dry, well-perfused. Neurologic: CN 2-12 grossly intact. Moving all extremities. Alert and oriented.  Psychiatric: Calm. Cooperative.    Labs and Imaging on Admission: I have personally reviewed following labs and imaging studies  CBC: Recent Labs  Lab 05/19/24 2200 05/20/24 0527 05/23/24 2144  WBC 9.3 9.9 10.6*  HGB 11.7* 10.2* 11.4*  HCT 35.6* 29.1* 32.5*  MCV 92.2 84.3 86.0  PLT 263 236 254   Basic Metabolic Panel: Recent Labs  Lab 05/20/24 0153 05/20/24 0527 05/20/24 1215 05/20/24 1551 05/23/24 2144  NA 129* 134* 127* 129* 123*  K 3.1* 2.6* 3.5 3.6 3.8  CL 93* 103 91* 101 90*  CO2 20* 18* 24 20* 21*  GLUCOSE 108* 113* 140* 115* 119*  BUN 14 12 13 12 21   CREATININE  0.91 0.64 0.96 0.84 0.97  CALCIUM  9.4 7.8* 9.2 7.7* 9.2  MG 1.6*  --  4.2* 2.7*  --   PHOS 3.0  --   --   --   --    GFR: CrCl cannot be calculated (Unknown ideal weight.). Liver Function Tests: Recent Labs  Lab 05/19/24 2200 05/20/24 0527  AST 26 22  ALT 18 13  ALKPHOS 65 48  BILITOT 1.0 0.4  PROT 7.7 5.7*  ALBUMIN 4.4 3.2*   No results for input(s): LIPASE, AMYLASE in the last 168 hours. No results for input(s): AMMONIA in the last 168 hours. Coagulation Profile: No results for input(s): INR, PROTIME in the last 168 hours. Cardiac Enzymes: No results for input(s): CKTOTAL, CKMB, CKMBINDEX, TROPONINI in the last 168 hours. BNP (last 3 results) No results for input(s): PROBNP in the last 8760 hours. HbA1C: No results for input(s): HGBA1C in the last 72 hours. CBG: Recent Labs  Lab 05/20/24 0802 05/20/24 1216  GLUCAP 153* 126*   Lipid Profile: No results for input(s): CHOL, HDL, LDLCALC, TRIG, CHOLHDL, LDLDIRECT in the last 72 hours. Thyroid  Function Tests: No results for input(s): TSH, T4TOTAL, FREET4, T3FREE, THYROIDAB in the last 72  hours. Anemia Panel: No results for input(s): VITAMINB12, FOLATE, FERRITIN, TIBC, IRON, RETICCTPCT in the last 72 hours. Urine analysis:    Component Value Date/Time   COLORURINE STRAW (A) 05/23/2024 2156   APPEARANCEUR CLEAR 05/23/2024 2156   LABSPEC 1.003 (L) 05/23/2024 2156   PHURINE 7.0 05/23/2024 2156   GLUCOSEU NEGATIVE 05/23/2024 2156   HGBUR NEGATIVE 05/23/2024 2156   BILIRUBINUR NEGATIVE 05/23/2024 2156   KETONESUR NEGATIVE 05/23/2024 2156   PROTEINUR NEGATIVE 05/23/2024 2156   UROBILINOGEN 0.2 04/02/2014 1222   NITRITE NEGATIVE 05/23/2024 2156   LEUKOCYTESUR NEGATIVE 05/23/2024 2156   Sepsis Labs: @LABRCNTIP (procalcitonin:4,lacticidven:4) ) Recent Results (from the past 240 hours)  Urine Culture (for pregnant, neutropenic or urologic patients or patients with an indwelling urinary catheter)     Status: None   Collection Time: 05/20/24  1:08 AM   Specimen: Urine, Clean Catch  Result Value Ref Range Status   Specimen Description   Final    URINE, CLEAN CATCH Performed at [facility], [address] [place], [place] [address]    Special Requests   Final    NONE Performed at [facility], [address], [place], [place] [address]    Culture   Final    NO GROWTH Performed at [facility], [address] [place], [place] [address]    Report Status 05/21/2024 FINAL  Final     Radiological Exams on Admission: No results found.   Assessment/Plan   1. Hyponatremia  - Serum sodium is 123 on admission; she appears euvolemic  - Recent workup includes serum osm 274, normal TSH, and urine osm 142 with urine sodium 42 after receiving 1 liter isotonic IVF; this suggests SIADH or glucocorticoid deficiency  - Check ACTH  stim test, restrict free water intake, follow serial chem panels   2. Hypertension  - Continue amlodipine , metoprolol , and ARB    3. Depression, anxiety  - Continue Zoloft  for now, consider  trial off of this medication if ACTH  stim test is normal    4. Type II DM  - Check CBGs and use low-intensity SSI for now    5. Cognitive impairment  - Delirium precautions    DVT prophylaxis: SCDs  Code Status: Full  Level of Care: Level of  care: Telemetry Family Communication: Husband at bedside, discussed with son by phone  Disposition Plan:  Patient is from: Home   Anticipated d/c is to: Home  Anticipated d/c date is: 6/26 or 05/26/24  Patient currently: Pending ACTH  stim test, improved sodium level  Consults called: None  Admission status: Observation     Evalene GORMAN Sprinkles, MD [facility]  05/24/2024, 4:58 AM

## 2024-05-24 NOTE — ED Provider Notes (Signed)
 [place] EMERGENCY DEPARTMENT AT [facility] Provider Note   CSN: [id number] Arrival date & time: 05/23/24  2115     Patient presents with: Fatigue   Cindy Rhodes is a 78 y.o. female.   HPI     This is a 78 year old female who presents with concern for generalized weakness and fatigue.  Patient reports worsening malaise and fatigue over the last 24 hours.  States that she did not wake up until early afternoon today.  Recently diagnosed with UTI.  Was switched from cefdinir to Keflex because of an intolerance.  She is unsure whether the UTI is still ongoing.  She generally describes feeling increased fatigue.  No chest pain, shortness of breath, nausea, vomiting.  Recently seen and evaluated and noted to be hyponatremic.  This was replaced after a brief admission.  She has history of chronic hyponatremia.  Prior to Admission medications   Medication Sig Start Date End Date Taking? Authorizing Provider  amLODipine  (NORVASC ) 10 MG tablet Take 1 tablet (10 mg total) by mouth daily. 05/20/24 05/20/25  Pahwani, Ravi, MD  Ascorbic Acid (VITAMIN C) 500 MG CAPS Take 500 mg by mouth daily.    [provider]  aspirin  EC 81 MG tablet Take 1 tablet (81 mg total) by mouth daily. Swallow whole. 11/08/23   Jerri Pfeiffer, MD  Calcium  Carbonate-Vitamin D  600-400 MG-UNIT tablet Take 1 tablet by mouth daily.    [provider]  Coenzyme Q10 (CO Q 10 PO) Take 1 capsule by mouth daily.    [provider]  cyanocobalamin  1000 MCG tablet Take 1 tablet (1,000 mcg total) by mouth daily. 11/08/23   Jerri Pfeiffer, MD  famotidine  (PEPCID ) 20 MG tablet Take 20 mg by mouth daily.    [provider]  glucosamine-chondroitin 500-400 MG tablet Take 3 tablets by mouth daily.     [provider]  memantine  (NAMENDA ) 10 MG tablet TAKE 1 TABLET BY MOUTH EVERY NIGHT FOR 2 WEEKS, THEN INCREASE TO TAKE 1 TABLET BY MOUTH TWO TIMES A DAY Patient taking differently: Take 10 mg  by mouth 2 (two) times daily. 01/11/24   Wertman, Sara E, PA-C  metFORMIN  (GLUCOPHAGE -XR) 500 MG 24 hr tablet Take 1,000 mg by mouth 2 (two) times daily. 07/01/19   [provider]  metoprolol  tartrate (LOPRESSOR ) 25 MG tablet Take 25 mg by mouth 2 (two) times daily.  09/30/13   [provider]  Multiple Vitamin (MULTIVITAMIN WITH MINERALS) TABS tablet Take 1 tablet by mouth daily.    [provider]  oxybutynin  (DITROPAN -XL) 10 MG 24 hr tablet Take 10 mg by mouth daily.  12/28/18   [provider]  potassium chloride  SA (KLOR-CON  M) 20 MEQ tablet Take 2 tablets (40 mEq total) by mouth 2 (two) times daily for 2 days. 11/07/23 11/09/23  Jerri Pfeiffer, MD  rosuvastatin  (CRESTOR ) 5 MG tablet Take 1 tablet (5 mg total) by mouth at bedtime. 11/07/23   Jerri Pfeiffer, MD  sertraline  (ZOLOFT ) 50 MG tablet Take 50 mg by mouth daily. 07/22/13   [provider]  spironolactone  (ALDACTONE ) 25 MG tablet Take 0.5 tablets (12.5 mg total) by mouth daily. 03/29/23   Drusilla Sabas RAMAN, MD  telmisartan  (MICARDIS ) 80 MG tablet Take 1 tablet (80 mg total) by mouth daily. 08/18/19   Lorriane Holmes, MD  telmisartan -hydrochlorothiazide (MICARDIS  HCT) 80-25 MG tablet Take 1 tablet by mouth daily.  07/05/18 08/18/19  [provider]    Allergies: Hydrochlorothiazide, Macrolides and  ketolides, Meloxicam, Pork-derived products, and Sulfa antibiotics    Review of Systems  Constitutional:  Positive for chills and fatigue. Negative for fever.  Respiratory:  Negative for shortness of breath.   Cardiovascular:  Negative for chest pain.  Gastrointestinal:  Negative for abdominal pain.  All other systems reviewed and are negative.   Updated Vital Signs BP (!) 187/84   Pulse 60   Temp 98 F (36.7 C)   Resp 18   SpO2 100%   Physical Exam Vitals and nursing note reviewed.  Constitutional:      Appearance: She is well-developed. She is not ill-appearing.  HENT:     Head: Normocephalic  and atraumatic.   Eyes:     Pupils: Pupils are equal, round, and reactive to light.    Cardiovascular:     Rate and Rhythm: Normal rate and regular rhythm.     Heart sounds: Normal heart sounds.  Pulmonary:     Effort: Pulmonary effort is normal. No respiratory distress.     Breath sounds: No wheezing.  Abdominal:     General: Bowel sounds are normal.     Palpations: Abdomen is soft.     Tenderness: There is no abdominal tenderness. There is no guarding.   Musculoskeletal:     Cervical back: Neck supple.   Skin:    General: Skin is warm and dry.   Neurological:     Mental Status: She is alert and oriented to person, place, and time.   Psychiatric:        Mood and Affect: Mood normal.     (all labs ordered are listed, but only abnormal results are displayed) Labs Reviewed  URINALYSIS, ROUTINE W REFLEX MICROSCOPIC - Abnormal; Notable for the following components:      Result Value   Color, Urine STRAW (*)    Specific Gravity, Urine 1.003 (*)    All other components within normal limits  BASIC METABOLIC PANEL WITH GFR - Abnormal; Notable for the following components:   Sodium 123 (*)    Chloride 90 (*)    CO2 21 (*)    Glucose, Bld 119 (*)    All other components within normal limits  CBC - Abnormal; Notable for the following components:   WBC 10.6 (*)    RBC 3.78 (*)    Hemoglobin 11.4 (*)    HCT 32.5 (*)    All other components within normal limits    EKG: None  Radiology: No results found.   Procedures   Medications Ordered in the ED  sodium chloride  0.9 % bolus 1,000 mL (1,000 mLs Intravenous Bolus 05/24/24 0257)                                    Medical Decision Making Amount and/or Complexity of Data Reviewed Labs: ordered.  Risk Decision regarding hospitalization.   This patient presents to the ED for concern of fatigue, this involves an extensive number of treatment options, and is a complaint that carries with it a high risk of  complications and morbidity.  I considered the following differential and admission for this acute, potentially life threatening condition.  The differential diagnosis includes and faction, metabolic derangement  MDM:    This is a 78 year old female who presents with concern for worsening fatigue and malaise.  Concerned may be related to ongoing UTI.  She is overall nontoxic and vital signs are notable for  blood pressure of 187/84.  History of the same.  Urinalysis is not consistent with UTI.  I reviewed her last urine culture which was negative.  BMP is notable for sodium of 123.  She had gotten down to 125 5 days ago and corrected slightly with some fluids in the emergency department.  Etiology is unclear.  Would not likely be related to her current antibiotics.  Discussed options with the patient.  Offered fluids in the ED and recheck versus observation.  Patient prefers ops admission as she states she does not feel well.  (Labs, imaging, consults)  Labs: I Ordered, and personally interpreted labs.  The pertinent results include: CBC, CMP, urinalysis  Imaging Studies ordered: I ordered imaging studies including none I independently visualized and interpreted imaging. I agree with the radiologist interpretation  Additional history obtained from chart review.  External records from outside source obtained and reviewed including prior evaluations  Cardiac Monitoring: The patient was maintained on a cardiac monitor.  If on the cardiac monitor, I personally viewed and interpreted the cardiac monitored which showed an underlying rhythm of: Sinus  Reevaluation: After the interventions noted above, I reevaluated the patient and found that they have :stayed the same  Social Determinants of Health:  lives independently  Disposition: Admit  Co morbidities that complicate the patient evaluation  Past Medical History:  Diagnosis Date   Acute lower UTI 08/12/2017   Acute metabolic  encephalopathy 03/18/2023   Atrophy of vagina 05/14/2021   Cerebrovascular disease    moderate to severe per MRI   DDD (degenerative disc disease), cervical 10/13/2019   Decreased estrogen level 05/14/2021   Diabetic renal disease 05/14/2021   Essential hypertension 08/12/2017   Generalized anxiety disorder 05/14/2021   Hypertensive crisis 03/18/2023   Hypokalemia 08/12/2017   Hypomagnesemia 03/18/2023   Hyponatremia 08/12/2017   Interstitial cystitis 05/14/2021   Leukocytosis 05/14/2021   Major depressive disorder in remission 05/14/2021   Migraine without aura, not refractory 05/14/2021   Mild cognitive impairment of uncertain or unknown etiology 05/27/2023   Neck pain 08/28/2019   Osteopenia 05/14/2021   Pain in right hand 05/08/2019   Pincer nail deformity 01/24/2020   Prolonged QT interval 03/18/2023   Pure hypercholesterolemia 05/14/2021   Spinal stenosis in cervical region 10/13/2019   Subacute vulvitis 05/14/2021   Trigger finger of right hand 05/08/2019   Type 2 diabetes mellitus with hyperglycemia 08/12/2017   Urge incontinence of urine 05/14/2021     Medicines Meds ordered this encounter  Medications   sodium chloride  0.9 % bolus 1,000 mL    I have reviewed the patients home medicines and have made adjustments as needed  Problem List / ED Course: Problem List Items Addressed This Visit       Other   Hyponatremia - Primary             Final diagnoses:  Hyponatremia    ED Discharge Orders     None          Bari Charmaine FALCON, MD 05/24/24 [phone]

## 2024-05-24 NOTE — Progress Notes (Signed)
SATURATION QUALIFICATIONS: (This note is used to comply with regulatory documentation for home oxygen)  Patient Saturations on Room Air at Rest = 96%  Patient Saturations on Room Air while Ambulating = 98%  Patient Saturations on 0 Liters of oxygen while Ambulating = 98%  Please briefly explain why patient needs home oxygen: 

## 2024-05-26 DIAGNOSIS — E871 Hypo-osmolality and hyponatremia: Secondary | ICD-10-CM | POA: Diagnosis not present

## 2024-06-05 DIAGNOSIS — N179 Acute kidney failure, unspecified: Secondary | ICD-10-CM | POA: Diagnosis not present

## 2024-06-08 ENCOUNTER — Encounter: Payer: Self-pay | Admitting: Physician Assistant

## 2024-06-08 ENCOUNTER — Ambulatory Visit: Admitting: Physician Assistant

## 2024-06-08 VITALS — BP 146/76 | HR 69 | Ht 62.0 in | Wt 124.0 lb

## 2024-06-08 DIAGNOSIS — G3184 Mild cognitive impairment, so stated: Secondary | ICD-10-CM

## 2024-06-08 DIAGNOSIS — R413 Other amnesia: Secondary | ICD-10-CM

## 2024-06-08 DIAGNOSIS — I679 Cerebrovascular disease, unspecified: Secondary | ICD-10-CM

## 2024-06-08 NOTE — Patient Instructions (Addendum)
 It was a pleasure to see you today at our office.   Recommendations:  Neurocognitive evaluation  Increase the memantine  to 10 mg as directed  Follow up in 6  months, after you had the neuropsych evaluation Recommend good control of cardiovascular risk factors.    PT for strength and balance    For psychiatric meds, mood meds: Please have your primary care physician manage these medications.  If you have any severe symptoms of a stroke, or other severe issues such as confusion,severe chills or fever, etc call 911 or go to the ER as you may need to be evaluated further    For assessment of decision of mental capacity and competency:  Call Dr. Rosaline Nine, geriatric psychiatrist at [phone]  Counseling regarding caregiver distress, including caregiver depression, anxiety and issues regarding community resources, adult day care programs, adult living facilities, or memory care questions:  please contact your  Primary Doctor's Social Worker   Whom to call: Memory  decline, memory medications: Call our office [phone]    [email or web address]   RECOMMENDATIONS FOR ALL PATIENTS WITH MEMORY PROBLEMS: 1. Continue to exercise (Recommend 30 minutes of walking everyday, or 3 hours every week) 2. Increase social interactions - continue going to [place] and enjoy social gatherings with friends and family 3. Eat healthy, avoid fried foods and eat more fruits and vegetables 4. Maintain adequate blood pressure, blood sugar, and blood cholesterol level. Reducing the risk of stroke and cardiovascular disease also helps promoting better memory. 5. Avoid stressful situations. Live a simple life and avoid aggravations. Organize your time and prepare for the next day in anticipation. 6. Sleep well, avoid any interruptions of sleep and avoid any distractions in the bedroom that may interfere with adequate sleep quality 7. Avoid sugar, avoid  sweets as there is a strong link between excessive sugar intake, diabetes, and cognitive impairment We discussed the Mediterranean diet, which has been shown to help patients reduce the risk of progressive memory disorders and reduces cardiovascular risk. This includes eating fish, eat fruits and green leafy vegetables, nuts like almonds and hazelnuts, walnuts, and also use olive oil. Avoid fast foods and fried foods as much as possible. Avoid sweets and sugar as sugar use has been linked to worsening of memory function.  There is always a concern of gradual progression of memory problems. If this is the case, then we may need to adjust level of care according to patient needs. Support, both to the patient and caregiver, should then be put into place.      You have been referred for a neuropsychological evaluation (i.e., evaluation of memory and thinking abilities). Please bring someone with you to this appointment if possible, as it is helpful for the doctor to hear from both you and another adult who knows you well. Please bring eyeglasses and hearing aids if you wear them.    The evaluation will take approximately 3 hours and has two parts:   The first part is a clinical interview with the neuropsychologist (Dr. Richie or Dr. Jackquline). During the interview, the neuropsychologist will speak with you and the individual you brought to the appointment.    The second part of the evaluation is testing with the doctor's technician Neal or Luke). During the testing, the technician will ask you to remember different types of material, solve problems, and answer some questionnaires. Your family member will not be present for this portion of the evaluation.   Please note: We must reserve several  hours of the neuropsychologist's time and the psychometrician's time for your evaluation appointment. As such, there is a No-Show fee of $100. If you are unable to attend any of your appointments, please contact our  office as soon as possible to reschedule.      DRIVING: Regarding driving, in patients with progressive memory problems, driving will be impaired. We advise to have someone else do the driving if trouble finding directions or if minor accidents are reported. Independent driving assessment is available to determine safety of driving.   If you are interested in the driving assessment, you can contact the following:  [employer] in [place] [phone]  [facility] [phone]  [facility] [phone]  [facility] [phone] or [phone]   FALL PRECAUTIONS: Be cautious when walking. Scan the area for obstacles that may increase the risk of trips and falls. When getting up in the mornings, sit up at the edge of the bed for a few minutes before getting out of bed. Consider elevating the bed at the head end to avoid drop of blood pressure when getting up. Walk always in a well-lit room (use night lights in the walls). Avoid area rugs or power cords from appliances in the middle of the walkways. Use a walker or a cane if necessary and consider physical therapy for balance exercise. Get your eyesight checked regularly.  FINANCIAL OVERSIGHT: Supervision, especially oversight when making financial decisions or transactions is also recommended.  HOME SAFETY: Consider the safety of the kitchen when operating appliances like stoves, microwave oven, and blender. Consider having supervision and share cooking responsibilities until no longer able to participate in those. Accidents with firearms and other hazards in the house should be identified and addressed as well.   ABILITY TO BE LEFT ALONE: If patient is unable to contact 911 operator, consider using LifeLine, or when the need is there, arrange for someone to stay with patients. Smoking is a fire hazard, consider supervision or cessation. Risk of wandering should be assessed by caregiver and if  detected at any point, supervision and safe proof recommendations should be instituted.  MEDICATION SUPERVISION: Inability to self-administer medication needs to be constantly addressed. Implement a mechanism to ensure safe administration of the medications.      Mediterranean Diet A Mediterranean diet refers to food and lifestyle choices that are based on the traditions of countries located on the [place]. This way of eating has been shown to help prevent certain conditions and improve outcomes for people who have chronic diseases, like kidney disease and heart disease. What are tips for following this plan? Lifestyle  Cook and eat meals together with your family, when possible. Drink enough fluid to keep your urine clear or pale yellow. Be physically active every day. This includes: Aerobic exercise like running or swimming. Leisure activities like gardening, walking, or housework. Get 7-8 hours of sleep each night. If recommended by your health care provider, drink red wine in moderation. This means 1 glass a day for nonpregnant women and 2 glasses a day for men. A glass of wine equals 5 oz (150 mL). Reading food labels  Check the serving size of packaged foods. For foods such as rice and pasta, the serving size refers to the amount of cooked product, not dry. Check the total fat in packaged foods. Avoid foods that have saturated fat or trans fats. Check the ingredients list for added sugars, such as corn syrup. Shopping  At the grocery store, buy  most of your food from the areas near the walls of the store. This includes: Fresh fruits and vegetables (produce). Grains, beans, nuts, and seeds. Some of these may be available in unpackaged forms or large amounts (in bulk). Fresh seafood. Poultry and eggs. Low-fat dairy products. Buy whole ingredients instead of prepackaged foods. Buy fresh fruits and vegetables in-season from local farmers markets. Buy frozen fruits and  vegetables in resealable bags. If you do not have access to quality fresh seafood, buy precooked frozen shrimp or canned fish, such as tuna, salmon, or sardines. Buy small amounts of raw or cooked vegetables, salads, or olives from the deli or salad bar at your store. Stock your pantry so you always have certain foods on hand, such as olive oil, canned tuna, canned tomatoes, rice, pasta, and beans. Cooking  Cook foods with extra-virgin olive oil instead of using butter or other vegetable oils. Have meat as a side dish, and have vegetables or grains as your main dish. This means having meat in small portions or adding small amounts of meat to foods like pasta or stew. Use beans or vegetables instead of meat in common dishes like chili or lasagna. Experiment with different cooking methods. Try roasting or broiling vegetables instead of steaming or sauteing them. Add frozen vegetables to soups, stews, pasta, or rice. Add nuts or seeds for added healthy fat at each meal. You can add these to yogurt, salads, or vegetable dishes. Marinate fish or vegetables using olive oil, lemon juice, garlic, and fresh herbs. Meal planning  Plan to eat 1 vegetarian meal one day each week. Try to work up to 2 vegetarian meals, if possible. Eat seafood 2 or more times a week. Have healthy snacks readily available, such as: Vegetable sticks with hummus. Greek yogurt. Fruit and nut trail mix. Eat balanced meals throughout the week. This includes: Fruit: 2-3 servings a day Vegetables: 4-5 servings a day Low-fat dairy: 2 servings a day Fish, poultry, or lean meat: 1 serving a day Beans and legumes: 2 or more servings a week Nuts and seeds: 1-2 servings a day Whole grains: 6-8 servings a day Extra-virgin olive oil: 3-4 servings a day Limit red meat and sweets to only a few servings a month What are my food choices? Mediterranean diet Recommended Grains: Whole-grain pasta. Brown rice. Bulgar wheat. Polenta.  Couscous. Whole-wheat bread. [place] Made[place]. Vegetables: Artichokes. Beets. Broccoli. Cabbage. Carrots. Eggplant. Green beans. Chard. Kale. Spinach. Onions. Leeks. Peas. Squash. Tomatoes. Peppers. Radishes. Fruits: Apples. Apricots. Avocado. Berries. Bananas. Cherries. Dates. Figs. Grapes. Lemons. Melon. Oranges. Peaches. Plums. Pomegranate. Meats and other protein foods: Beans. Almonds. Sunflower seeds. Pine nuts. Peanuts. Cod. Salmon. Scallops. Shrimp. Tuna. Tilapia. Clams. Oysters. Eggs. Dairy: Low-fat milk. Cheese. Greek yogurt. Beverages: Water. Red wine. Herbal tea. Fats and oils: Extra virgin olive oil. Avocado oil. Grape seed oil. Sweets and desserts: [place] yogurt with honey. Baked apples. Poached pears. Trail mix. Seasoning and other foods: Basil. Cilantro. Coriander. Cumin. Mint. Parsley. Sage. Rosemary. Tarragon. Garlic. Oregano. Thyme. Pepper. Balsalmic vinegar. Tahini. Hummus. Tomato sauce. Olives. Mushrooms. Limit these Grains: Prepackaged pasta or rice dishes. Prepackaged cereal with added sugar. Vegetables: Deep fried potatoes (french fries). Fruits: Fruit canned in syrup. Meats and other protein foods: Beef. Pork. Lamb. Poultry with skin. Hot dogs. [place]. Dairy: Ice cream. Sour cream. Whole milk. Beverages: Juice. Sugar-sweetened soft drinks. Beer. Liquor and spirits. Fats and oils: Butter. Canola oil. Vegetable oil. Beef fat (tallow). Lard. Sweets and desserts: Cookies. Cakes. Pies. Candy. Seasoning and  other foods: Mayonnaise. Premade sauces and marinades. The items listed may not be a complete list. Talk with your dietitian about what dietary choices are right for you. Summary The Mediterranean diet includes both food and lifestyle choices. Eat a variety of fresh fruits and vegetables, beans, nuts, seeds, and whole grains. Limit the amount of red meat and sweets that you eat. Talk with your health care provider about whether it is safe for you to drink red wine in  moderation. This means 1 glass a day for nonpregnant women and 2 glasses a day for men. A glass of wine equals 5 oz (150 mL). This information is not intended to replace advice given to you by your health care provider. Make sure you discuss any questions you have with your health care provider. Document Released: 07/09/2016 Document Revised: 08/11/2016 Document Reviewed: 07/09/2016 Elsevier Interactive Patient Education  2017 [employer].

## 2024-06-08 NOTE — Progress Notes (Signed)
 Assessment/Plan:    Mild cognitive impairment   Cindy Rhodes is a very pleasant 78 y.o. LH female with a history of prolonged QT interval, hypertension, hyperlipidemia, DM2 with retinopathy, anxiety, depression, recent hospitalizations for symptomatic hyponatremia on May 19 2024 and May 23, 2024, with borderline SIADH with polydipsia, and a diagnosis of mild cognitive impairment likely due to vascular disease, recurrent metabolic encephalopathies presenting today in follow-up for evaluation of memory loss. Patient is on memantine  10 mg twice daily, tolerating well. Memory is stable with MMSE 26/30. Patient is able to participate on ADLs to her ability. Mood is good      Recommendations:   Follow up in  6 months. Continue memantine  10 mg twice daily (history of QT prolongation), side effects discussed. If QT no longer present and OK with Cards, will entertain [facility] in the future if needed Repeat neuropsych evaluation for diagnostic clarity and disease trajectory Recommend good control of cardiovascular risk factors, continue baby aspirin  daily, follow-up with cardiology Continue to control mood as per PCP Referral to PT for strength and balance Recommend checking hearing for better comprehension Follow hyponatremia with [facility]    Subjective:   This patient is accompanied in the office by her husband who supplements the history. Previous records as well as any outside records available were reviewed prior to todays visit.   Patient was last seen on 01/11/2024 .    Any changes in memory since last visit? STM not so good, has declined since the last visit-husband says  LTM is good. Went to [place] and on the way there she asked where are we going?[name] What days is it?[name]  LTM is good. She likes brain stimulating exercises. repeats oneself?  Endorsed by her husband Disoriented when walking into a room?  Patient denies.    Misplacing objects?  Patient denies.  Wandering  behavior?  Denies. Any personality changes since last visit? Denies.    Any worsening depression?: denies.   Hallucinations or paranoia?  Denies.   Seizures?   Denies.    Any sleep changes? Sleeps well.  Denies vivid dreams, REM behavior or sleepwalking   Sleep apnea?   Denies.  Any hygiene concerns?   Denies.   Independent of bathing and dressing?  Endorsed  Does the patient needs help with medications? Patient is in charge with husband supervision because she was delaying her doses, or missing.   Who is in charge of the finances?  Husband is in charge.    Any changes in appetite?  Appetite is very good.  Patient have trouble swallowing? Denies.   Does the patient cook?  No  Any headaches?    Denies.   Vision changes? Denies. Any hearing changes? Husband reports decreased hearing Chronic pain? Chronic back pain, well managed.  Ambulates with difficulty?  Uses a cane for stability. Not recently on PT Recent falls or head injuries? One fall inside the closet as she was getting dressed and lost balance, no head injuries or LOC.      Unilateral weakness, numbness or tingling?  Denies.   Any tremors?  Denies.   Any anosmia?    Denies.   Any incontinence of urine?  Denies. Had UTI 3 weeks ago Any bowel dysfunction?  Denies.      Patient lives with her husband at [facility].  Does the patient drive?  Not recently, husband does most of the driving   Initial visit 4/68/7975    How long did patient have  memory difficulties? Husband reports that has noticed some cognitive changes over the past year.  She did not notice any changes other than  some aging. Patient has some difficulty remembering recent conversations and people names. Nothing more than the average person at my age . She may have some difficulty finding the correct word.  I always look for the right word-she says . Likes to do puzzles, word search and other brain stimulating exercises.  repeats oneself?  Endorsed. She will  ask the same question-husband says Disoriented when walking into a room?  Patient denies   Leaving objects in unusual places? denies   Wandering behavior?  denies   Any personality changes?  Patient denies   Any history of depression?:  Patient denies   Hallucinations or paranoia?  Patient denies   Seizures?   Patient denies    Any sleep changes?  My sleep is not as sound as it used to be. Reports vivid dreams, REM behavior or sleepwalking   Sleep apnea?  Patient denies   Any hygiene concerns?  Patient denies   Independent of bathing and dressing?  Endorsed  Does the patient needs help with medications?  Patient is in charge   Who is in charge of the finances? Patient and husband in charge     Any changes in appetite?  denies     Patient have trouble swallowing? denies   Does the patient cook?  I  don't really cook, I am the cleaner.    Any kitchen accidents such as leaving the stove on? denies   Any headaches?  Only when she was having periods stopped around age 25 Chronic back pain ? denies   Ambulates with difficulty?  'Balance issues doing PT, for this last week. She likes to exercise at the [employer]  Recent falls or head injuries? Not recently. 3 years ago she fell and had a concussion.  Vision changes? Never had very good depth perception  Unilateral weakness, numbness or tingling? denies   Any tremors?   denies   Any anosmia?  denies   Any incontinence of urine?  She has a history of urge incontinence Any bowel dysfunction?  Occasionally she has lose stools.  Patient lives  [facility]  History of heavy alcohol intake? denies   History of heavy tobacco use? denies   Family history of dementia? denies  Does patient drive? Yes, no issues   Graduated from College Retired from [employer] at [employer]   Past Medical History:  Diagnosis Date   Acute lower UTI 08/12/2017   Acute metabolic encephalopathy 03/18/2023   Atrophy of vagina 05/14/2021    Cerebrovascular disease    moderate to severe per MRI   DDD (degenerative disc disease), cervical 10/13/2019   Decreased estrogen level 05/14/2021   Diabetic renal disease 05/14/2021   Essential hypertension 08/12/2017   Generalized anxiety disorder 05/14/2021   Hypertensive crisis 03/18/2023   Hypokalemia 08/12/2017   Hypomagnesemia 03/18/2023   Hyponatremia 08/12/2017   Interstitial cystitis 05/14/2021   Leukocytosis 05/14/2021   Major depressive disorder in remission 05/14/2021   Migraine without aura, not refractory 05/14/2021   Mild cognitive impairment of uncertain or unknown etiology 05/27/2023   Neck pain 08/28/2019   Osteopenia 05/14/2021   Pain in right hand 05/08/2019   Pincer nail deformity 01/24/2020   Prolonged QT interval 03/18/2023   Pure hypercholesterolemia 05/14/2021   Spinal stenosis in cervical region 10/13/2019   Subacute vulvitis 05/14/2021   Trigger finger of  right hand 05/08/2019   Type 2 diabetes mellitus with hyperglycemia 08/12/2017   Urge incontinence of urine 05/14/2021     Past Surgical History:  Procedure Laterality Date   ABDOMINAL HYSTERECTOMY     HAND SURGERY Left      PREVIOUS MEDICATIONS:   CURRENT MEDICATIONS:  Outpatient Encounter Medications as of 06/08/2024  Medication Sig   amLODipine  (NORVASC ) 10 MG tablet Take 1 tablet (10 mg total) by mouth daily.   Ascorbic Acid (VITAMIN C) 500 MG CAPS Take 500 mg by mouth daily.   aspirin  EC 81 MG tablet Take 1 tablet (81 mg total) by mouth daily. Swallow whole.   Calcium  Carbonate-Vitamin D  600-400 MG-UNIT tablet Take 1 tablet by mouth daily.   Coenzyme Q10 (CO Q 10 PO) Take 1 capsule by mouth daily.   cyanocobalamin  1000 MCG tablet Take 1 tablet (1,000 mcg total) by mouth daily.   glucosamine-chondroitin 500-400 MG tablet Take 2 tablets by mouth daily.   memantine  (NAMENDA ) 10 MG tablet TAKE 1 TABLET BY MOUTH EVERY NIGHT FOR 2 WEEKS, THEN INCREASE TO TAKE 1 TABLET BY MOUTH TWO TIMES A DAY    metFORMIN  (GLUCOPHAGE -XR) 500 MG 24 hr tablet Take 1,000 mg by mouth 2 (two) times daily.   metoprolol  tartrate (LOPRESSOR ) 25 MG tablet Take 25 mg by mouth 2 (two) times daily.    Multiple Vitamin (MULTIVITAMIN WITH MINERALS) TABS tablet Take 1 tablet by mouth daily.   oxybutynin  (DITROPAN -XL) 10 MG 24 hr tablet Take 10 mg by mouth daily.    rosuvastatin  (CRESTOR ) 5 MG tablet Take 1 tablet (5 mg total) by mouth at bedtime.   spironolactone  (ALDACTONE ) 25 MG tablet Take 1 tablet (25 mg total) by mouth daily. (Patient taking differently: Take 12.5 mg by mouth daily.)   telmisartan  (MICARDIS ) 80 MG tablet Take 1 tablet (80 mg total) by mouth daily.   [DISCONTINUED] telmisartan -hydrochlorothiazide (MICARDIS  HCT) 80-25 MG tablet Take 1 tablet by mouth daily.    No facility-administered encounter medications on file as of 06/08/2024.     Objective:     PHYSICAL EXAMINATION:    VITALS:   Vitals:   06/08/24 1303  BP: (!) 146/76  Pulse: 69  SpO2: 98%  Weight: 124 lb (56.2 kg)  Height: 5' 2 (1.575 m)    GEN:  The patient appears stated age and is in NAD. HEENT:  Normocephalic, atraumatic.   Neurological examination:  General: NAD, well-groomed, appears stated age. Orientation: The patient is alert. Oriented to person, place and to date.  Cranial nerves: There is good facial symmetry.The speech is fluent and clear. No aphasia or dysarthria. Fund of knowledge is appropriate. Recent memory impaired and remote memory is normal .  Attention and concentration are normal.  Able to name objects and repeat phrases.  Hearing is mildly decreased to conversational tone.   Delayed recall 2/3 Sensation: Sensation is intact to light touch throughout Motor: Strength is at least antigravity x4. DTR's 2/4 in UE/LE    MRI of the brain without contrast 11/04/2023, personally reviewed remarkable for punctate acute infarct in posterior limb of the internal capsule on the left without hemorrhage .  Of  note, neurology disagreed with that finding (series 3 image 24).  Chronic microhemorrhage in anterior right temporal and near the right occipital lobe, moderate to severe chronic microvascular ischemic changes.       04/22/2023    2:00 PM  Montreal Cognitive Assessment   Visuospatial/ Executive (0/5) 3  Naming (0/3) 3  Attention: Read list of digits (0/2) 0  Attention: Read list of letters (0/1) 1  Attention: Serial 7 subtraction starting at 100 (0/3) 3  Language: Repeat phrase (0/2) 2  Language : Fluency (0/1) 0  Abstraction (0/2) 2  Delayed Recall (0/5) 1  Orientation (0/6) 6  Total 21  Adjusted Score (based on education) 21       06/08/2024    2:00 PM  MMSE - Mini Mental State Exam  Orientation to time 5  Orientation to Place 5  Registration 3  Attention/ Calculation 3  Recall 2  Language- name 2 objects 2  Language- repeat 1  Language- follow 3 step command 3  Language- read & follow direction 1  Write a sentence 1  Copy design 0  Total score 26       Movement examination: Tone: There is normal tone in the UE/LE Abnormal movements:  no tremor.  No myoclonus.  No asterixis.   Coordination:  There is no decremation with RAM's. Normal finger to nose  Gait and Station: The patient has some difficulty arising out of a deep-seated chair without the use of the hands. The patient's stride length is good.  Gait is cautious and narrow.   Thank you for allowing us  the opportunity to participate in the care of this nice patient. Please do not hesitate to contact us  for any questions or concerns.   Total time spent on today's visit was 34 minutes dedicated to this patient today, preparing to see patient, examining the patient, ordering tests and/or medications and counseling the patient, documenting clinical information in the EHR or other health record, independently interpreting results and communicating results to the patient/family, discussing treatment and goals, answering  patient's questions and coordinating care.  Cc:  Rexanne Ingle, MD  Camie Sevin 06/08/2024 3:42 PM

## 2024-06-12 DIAGNOSIS — N179 Acute kidney failure, unspecified: Secondary | ICD-10-CM | POA: Diagnosis not present

## 2024-06-13 DIAGNOSIS — E871 Hypo-osmolality and hyponatremia: Secondary | ICD-10-CM | POA: Diagnosis not present

## 2024-06-13 DIAGNOSIS — F419 Anxiety disorder, unspecified: Secondary | ICD-10-CM | POA: Diagnosis not present

## 2024-06-13 DIAGNOSIS — I10 Essential (primary) hypertension: Secondary | ICD-10-CM | POA: Diagnosis not present

## 2024-06-13 DIAGNOSIS — E119 Type 2 diabetes mellitus without complications: Secondary | ICD-10-CM | POA: Diagnosis not present

## 2024-06-13 DIAGNOSIS — F32A Depression, unspecified: Secondary | ICD-10-CM | POA: Diagnosis not present

## 2024-06-15 DIAGNOSIS — N3020 Other chronic cystitis without hematuria: Secondary | ICD-10-CM | POA: Diagnosis not present

## 2024-06-15 DIAGNOSIS — N3941 Urge incontinence: Secondary | ICD-10-CM | POA: Diagnosis not present

## 2024-06-19 DIAGNOSIS — N179 Acute kidney failure, unspecified: Secondary | ICD-10-CM | POA: Diagnosis not present

## 2024-06-26 DIAGNOSIS — N179 Acute kidney failure, unspecified: Secondary | ICD-10-CM | POA: Diagnosis not present

## 2024-07-10 DIAGNOSIS — N179 Acute kidney failure, unspecified: Secondary | ICD-10-CM | POA: Diagnosis not present

## 2024-07-13 DIAGNOSIS — N179 Acute kidney failure, unspecified: Secondary | ICD-10-CM | POA: Diagnosis not present

## 2024-07-17 ENCOUNTER — Ambulatory Visit: Payer: PPO | Admitting: Physician Assistant

## 2024-07-25 DIAGNOSIS — N179 Acute kidney failure, unspecified: Secondary | ICD-10-CM | POA: Diagnosis not present

## 2024-07-26 DIAGNOSIS — Z85828 Personal history of other malignant neoplasm of skin: Secondary | ICD-10-CM | POA: Diagnosis not present

## 2024-07-26 DIAGNOSIS — D225 Melanocytic nevi of trunk: Secondary | ICD-10-CM | POA: Diagnosis not present

## 2024-07-26 DIAGNOSIS — D2271 Melanocytic nevi of right lower limb, including hip: Secondary | ICD-10-CM | POA: Diagnosis not present

## 2024-07-26 DIAGNOSIS — L821 Other seborrheic keratosis: Secondary | ICD-10-CM | POA: Diagnosis not present

## 2024-07-26 DIAGNOSIS — Z08 Encounter for follow-up examination after completed treatment for malignant neoplasm: Secondary | ICD-10-CM | POA: Diagnosis not present

## 2024-07-26 DIAGNOSIS — L814 Other melanin hyperpigmentation: Secondary | ICD-10-CM | POA: Diagnosis not present

## 2024-07-28 DIAGNOSIS — N179 Acute kidney failure, unspecified: Secondary | ICD-10-CM | POA: Diagnosis not present

## 2024-08-01 DIAGNOSIS — N179 Acute kidney failure, unspecified: Secondary | ICD-10-CM | POA: Diagnosis not present

## 2024-08-04 DIAGNOSIS — N179 Acute kidney failure, unspecified: Secondary | ICD-10-CM | POA: Diagnosis not present

## 2024-08-15 DIAGNOSIS — N1831 Chronic kidney disease, stage 3a: Secondary | ICD-10-CM | POA: Diagnosis not present

## 2024-08-15 DIAGNOSIS — E871 Hypo-osmolality and hyponatremia: Secondary | ICD-10-CM | POA: Diagnosis not present

## 2024-08-15 DIAGNOSIS — I129 Hypertensive chronic kidney disease with stage 1 through stage 4 chronic kidney disease, or unspecified chronic kidney disease: Secondary | ICD-10-CM | POA: Diagnosis not present

## 2024-08-15 DIAGNOSIS — E1122 Type 2 diabetes mellitus with diabetic chronic kidney disease: Secondary | ICD-10-CM | POA: Diagnosis not present

## 2024-08-29 DIAGNOSIS — M6281 Muscle weakness (generalized): Secondary | ICD-10-CM | POA: Diagnosis not present

## 2024-08-29 DIAGNOSIS — Z9181 History of falling: Secondary | ICD-10-CM | POA: Diagnosis not present

## 2024-08-29 DIAGNOSIS — E871 Hypo-osmolality and hyponatremia: Secondary | ICD-10-CM | POA: Diagnosis not present

## 2024-08-29 DIAGNOSIS — R2681 Unsteadiness on feet: Secondary | ICD-10-CM | POA: Diagnosis not present

## 2024-09-04 DIAGNOSIS — R2681 Unsteadiness on feet: Secondary | ICD-10-CM | POA: Diagnosis not present

## 2024-09-04 DIAGNOSIS — Z9181 History of falling: Secondary | ICD-10-CM | POA: Diagnosis not present

## 2024-09-04 DIAGNOSIS — M6281 Muscle weakness (generalized): Secondary | ICD-10-CM | POA: Diagnosis not present

## 2024-09-05 DIAGNOSIS — Z9181 History of falling: Secondary | ICD-10-CM | POA: Diagnosis not present

## 2024-09-05 DIAGNOSIS — M6281 Muscle weakness (generalized): Secondary | ICD-10-CM | POA: Diagnosis not present

## 2024-09-05 DIAGNOSIS — R2681 Unsteadiness on feet: Secondary | ICD-10-CM | POA: Diagnosis not present

## 2024-09-06 DIAGNOSIS — Z9181 History of falling: Secondary | ICD-10-CM | POA: Diagnosis not present

## 2024-09-06 DIAGNOSIS — R2681 Unsteadiness on feet: Secondary | ICD-10-CM | POA: Diagnosis not present

## 2024-09-11 DIAGNOSIS — R2681 Unsteadiness on feet: Secondary | ICD-10-CM | POA: Diagnosis not present

## 2024-09-11 DIAGNOSIS — Z9181 History of falling: Secondary | ICD-10-CM | POA: Diagnosis not present

## 2024-09-11 DIAGNOSIS — M6281 Muscle weakness (generalized): Secondary | ICD-10-CM | POA: Diagnosis not present

## 2024-09-12 DIAGNOSIS — N1831 Chronic kidney disease, stage 3a: Secondary | ICD-10-CM | POA: Diagnosis not present

## 2024-09-12 DIAGNOSIS — R2681 Unsteadiness on feet: Secondary | ICD-10-CM | POA: Diagnosis not present

## 2024-09-12 DIAGNOSIS — M6281 Muscle weakness (generalized): Secondary | ICD-10-CM | POA: Diagnosis not present

## 2024-09-12 DIAGNOSIS — Z9181 History of falling: Secondary | ICD-10-CM | POA: Diagnosis not present

## 2024-09-13 DIAGNOSIS — Z9181 History of falling: Secondary | ICD-10-CM | POA: Diagnosis not present

## 2024-09-13 DIAGNOSIS — R2681 Unsteadiness on feet: Secondary | ICD-10-CM | POA: Diagnosis not present

## 2024-09-13 DIAGNOSIS — M6281 Muscle weakness (generalized): Secondary | ICD-10-CM | POA: Diagnosis not present

## 2024-09-19 DIAGNOSIS — R2681 Unsteadiness on feet: Secondary | ICD-10-CM | POA: Diagnosis not present

## 2024-09-19 DIAGNOSIS — M6281 Muscle weakness (generalized): Secondary | ICD-10-CM | POA: Diagnosis not present

## 2024-09-19 DIAGNOSIS — Z9181 History of falling: Secondary | ICD-10-CM | POA: Diagnosis not present

## 2024-09-20 DIAGNOSIS — M6281 Muscle weakness (generalized): Secondary | ICD-10-CM | POA: Diagnosis not present

## 2024-09-20 DIAGNOSIS — R2681 Unsteadiness on feet: Secondary | ICD-10-CM | POA: Diagnosis not present

## 2024-09-20 DIAGNOSIS — Z9181 History of falling: Secondary | ICD-10-CM | POA: Diagnosis not present

## 2024-09-21 DIAGNOSIS — M6281 Muscle weakness (generalized): Secondary | ICD-10-CM | POA: Diagnosis not present

## 2024-09-21 DIAGNOSIS — R2681 Unsteadiness on feet: Secondary | ICD-10-CM | POA: Diagnosis not present

## 2024-09-21 DIAGNOSIS — Z9181 History of falling: Secondary | ICD-10-CM | POA: Diagnosis not present

## 2024-09-25 DIAGNOSIS — Z9181 History of falling: Secondary | ICD-10-CM | POA: Diagnosis not present

## 2024-09-25 DIAGNOSIS — R2681 Unsteadiness on feet: Secondary | ICD-10-CM | POA: Diagnosis not present

## 2024-09-25 DIAGNOSIS — M6281 Muscle weakness (generalized): Secondary | ICD-10-CM | POA: Diagnosis not present

## 2024-10-02 DIAGNOSIS — Z9181 History of falling: Secondary | ICD-10-CM | POA: Diagnosis not present

## 2024-10-02 DIAGNOSIS — M6281 Muscle weakness (generalized): Secondary | ICD-10-CM | POA: Diagnosis not present

## 2024-10-02 DIAGNOSIS — R2681 Unsteadiness on feet: Secondary | ICD-10-CM | POA: Diagnosis not present

## 2024-10-04 DIAGNOSIS — Z9181 History of falling: Secondary | ICD-10-CM | POA: Diagnosis not present

## 2024-10-04 DIAGNOSIS — M6281 Muscle weakness (generalized): Secondary | ICD-10-CM | POA: Diagnosis not present

## 2024-10-04 DIAGNOSIS — R2681 Unsteadiness on feet: Secondary | ICD-10-CM | POA: Diagnosis not present

## 2024-10-05 DIAGNOSIS — Z9181 History of falling: Secondary | ICD-10-CM | POA: Diagnosis not present

## 2024-10-05 DIAGNOSIS — R2681 Unsteadiness on feet: Secondary | ICD-10-CM | POA: Diagnosis not present

## 2024-10-05 DIAGNOSIS — M6281 Muscle weakness (generalized): Secondary | ICD-10-CM | POA: Diagnosis not present

## 2024-10-06 DIAGNOSIS — N1831 Chronic kidney disease, stage 3a: Secondary | ICD-10-CM | POA: Diagnosis not present

## 2024-10-06 DIAGNOSIS — E1159 Type 2 diabetes mellitus with other circulatory complications: Secondary | ICD-10-CM | POA: Diagnosis not present

## 2024-10-06 DIAGNOSIS — F325 Major depressive disorder, single episode, in full remission: Secondary | ICD-10-CM | POA: Diagnosis not present

## 2024-10-06 DIAGNOSIS — Z23 Encounter for immunization: Secondary | ICD-10-CM | POA: Diagnosis not present

## 2024-10-06 DIAGNOSIS — G3184 Mild cognitive impairment, so stated: Secondary | ICD-10-CM | POA: Diagnosis not present

## 2024-10-06 DIAGNOSIS — M8588 Other specified disorders of bone density and structure, other site: Secondary | ICD-10-CM | POA: Diagnosis not present

## 2024-10-06 DIAGNOSIS — E7800 Pure hypercholesterolemia, unspecified: Secondary | ICD-10-CM | POA: Diagnosis not present

## 2024-10-06 DIAGNOSIS — E1121 Type 2 diabetes mellitus with diabetic nephropathy: Secondary | ICD-10-CM | POA: Diagnosis not present

## 2024-10-06 DIAGNOSIS — E871 Hypo-osmolality and hyponatremia: Secondary | ICD-10-CM | POA: Diagnosis not present

## 2024-10-06 DIAGNOSIS — I10 Essential (primary) hypertension: Secondary | ICD-10-CM | POA: Diagnosis not present

## 2024-10-09 DIAGNOSIS — Z9181 History of falling: Secondary | ICD-10-CM | POA: Diagnosis not present

## 2024-10-09 DIAGNOSIS — R2681 Unsteadiness on feet: Secondary | ICD-10-CM | POA: Diagnosis not present

## 2024-10-09 DIAGNOSIS — M6281 Muscle weakness (generalized): Secondary | ICD-10-CM | POA: Diagnosis not present

## 2024-10-10 DIAGNOSIS — N1831 Chronic kidney disease, stage 3a: Secondary | ICD-10-CM | POA: Diagnosis not present

## 2024-10-11 DIAGNOSIS — Z9181 History of falling: Secondary | ICD-10-CM | POA: Diagnosis not present

## 2024-10-11 DIAGNOSIS — R2681 Unsteadiness on feet: Secondary | ICD-10-CM | POA: Diagnosis not present

## 2024-10-11 DIAGNOSIS — M6281 Muscle weakness (generalized): Secondary | ICD-10-CM | POA: Diagnosis not present

## 2024-10-11 DIAGNOSIS — N1831 Chronic kidney disease, stage 3a: Secondary | ICD-10-CM | POA: Diagnosis not present

## 2024-10-14 DIAGNOSIS — M4155 Other secondary scoliosis, thoracolumbar region: Secondary | ICD-10-CM | POA: Diagnosis not present

## 2024-10-14 DIAGNOSIS — M5416 Radiculopathy, lumbar region: Secondary | ICD-10-CM | POA: Insufficient documentation

## 2024-10-14 DIAGNOSIS — M47816 Spondylosis without myelopathy or radiculopathy, lumbar region: Secondary | ICD-10-CM | POA: Diagnosis not present

## 2024-10-19 DIAGNOSIS — Z9181 History of falling: Secondary | ICD-10-CM | POA: Diagnosis not present

## 2024-10-19 DIAGNOSIS — M6281 Muscle weakness (generalized): Secondary | ICD-10-CM | POA: Diagnosis not present

## 2024-10-19 DIAGNOSIS — R2681 Unsteadiness on feet: Secondary | ICD-10-CM | POA: Diagnosis not present

## 2024-10-22 DIAGNOSIS — M7989 Other specified soft tissue disorders: Secondary | ICD-10-CM | POA: Diagnosis not present

## 2024-11-03 DIAGNOSIS — N1831 Chronic kidney disease, stage 3a: Secondary | ICD-10-CM | POA: Diagnosis not present

## 2024-12-28 ENCOUNTER — Encounter: Payer: Self-pay | Admitting: Psychology

## 2024-12-28 ENCOUNTER — Ambulatory Visit: Payer: PPO | Admitting: Psychology

## 2024-12-28 ENCOUNTER — Ambulatory Visit: Payer: Self-pay | Admitting: Psychology

## 2024-12-28 DIAGNOSIS — I999 Unspecified disorder of circulatory system: Secondary | ICD-10-CM

## 2024-12-28 DIAGNOSIS — F0670 Mild neurocognitive disorder due to known physiological condition without behavioral disturbance: Secondary | ICD-10-CM

## 2024-12-28 DIAGNOSIS — R4189 Other symptoms and signs involving cognitive functions and awareness: Secondary | ICD-10-CM

## 2024-12-28 NOTE — Progress Notes (Signed)
" ° °  Psychometrician Note   Cognitive testing was administered to Cindy Rhodes by Lonell Jude, B.S. (psychometrist) under the supervision of Dr. Arthea KYM Maryland, Ph.D., ABPP, licensed psychologist on 12/28/2024. Cindy Rhodes did not appear overtly distressed by the testing session per behavioral observation or responses across self-report questionnaires. Rest breaks were offered.   The battery of tests administered was selected by Dr. Zachary C. Merz, Ph.D., ABPP with consideration to Cindy Rhodes's current level of functioning, the nature of her symptoms, emotional and behavioral responses during interview, level of literacy, observed level of motivation/effort, and the nature of the referral question. This battery was communicated to the psychometrist. Communication between Dr. Arthea KYM Maryland, Ph.D., ABPP and the psychometrist was ongoing throughout the evaluation and Dr. Arthea KYM Maryland, Ph.D., ABPP was immediately accessible at all times. Dr. Zachary C. Merz, Ph.D., ABPP provided supervision to the psychometrist on the date of this service to the extent necessary to assure the quality of all services provided.    Cindy Rhodes [facility] will return within approximately 1-2 weeks for an interactive feedback session with Dr. Maryland at which time her test performances, clinical impressions, and treatment recommendations will be reviewed in detail. Cindy Rhodes understands she can contact our office should she require our assistance before this time.  A total of 140 minutes of billable time were spent face-to-face with Cindy Rhodes by the psychometrist. This includes both test administration and scoring time. Billing for these services is reflected in the clinical report generated by Dr. Arthea KYM Maryland, Ph.D., ABPP  This note reflects time spent with the psychometrician and does not include test scores or any clinical interpretations made by Dr. Maryland. The full report will follow in a separate note. "

## 2025-01-04 ENCOUNTER — Encounter: Payer: Self-pay | Admitting: Psychology

## 2025-01-04 ENCOUNTER — Ambulatory Visit: Payer: PPO | Admitting: Psychology

## 2025-01-04 DIAGNOSIS — F0670 Mild neurocognitive disorder due to known physiological condition without behavioral disturbance: Secondary | ICD-10-CM | POA: Diagnosis not present

## 2025-01-04 DIAGNOSIS — I999 Unspecified disorder of circulatory system: Secondary | ICD-10-CM | POA: Diagnosis not present

## 2025-01-04 NOTE — Progress Notes (Signed)
"  ° °  Neuropsychology Feedback Session Cindy Rhodes. [facility] [place] Department of Neurology  Reason for Referral:   Cindy Rhodes is a 79 y.o. right-handed Caucasian female referred by Camie Sevin, PA-C, to characterize her current cognitive functioning and assist with diagnostic clarity and treatment planning in the context of subjective cognitive decline.   Feedback:   Cindy Rhodes completed a comprehensive neuropsychological evaluation on 12/28/2024. Please refer to that encounter for the full report and recommendations. Briefly, results suggested primary deficits surrounding processing speed, executive functioning, and encoding (i.e., learning) aspects of memory. Additional weakness/variability was exhibited across complex attention, semantic fluency, and visuospatial abilities. Performances were appropriate relative to age-matched peers across basic attention, receptive language, phonemic fluency, confrontation naming, and both delayed retrieval and recognition/consolidation aspects of memory. Regarding the cause for ongoing cognitive dysfunction, a primary vascular/medical etiology remains plausible and is arguably most likely in my opinion. Prior neuroimaging has suggested punctate infarcts in the posterior limb of the left internal capsule, a chronic microhemorrhage in the anterior right temporal lobe, and background moderate to severe microvascular ischemic disease. Her punctate infarcts occurred after previous testing was completed and could therefore represent a primary culprit for mild decline. Overall cerebrovascular dysfunction could reasonably explain current testing patterns/performances. There could also be some contribution from prior hospitalizations surrounding hyponatremia and concern for metabolic encephalopathy, as well as hearing loss.   Cindy Rhodes was accompanied by her husband during the current feedback session. Content of the current session focused on the results of her  neuropsychological evaluation. Cindy Rhodes was given the opportunity to ask questions and her questions were answered. She was encouraged to reach out should additional questions arise. A copy of her report was provided at the conclusion of the visit.      One unit [id number] (35 minutes) was billed for Dr. Loralee time spent preparing for, conducting, and documenting the current feedback session with Cindy Rhodes. "

## 2025-01-09 ENCOUNTER — Ambulatory Visit: Admitting: Physician Assistant
